# Patient Record
Sex: Female | Born: 1965 | Race: White | Hispanic: No | Marital: Married | Smoking: Never smoker
Health system: Southern US, Community
[De-identification: ages and names within clinical notes are randomized; demographics above are authoritative.]

## PROBLEM LIST (undated history)

## (undated) DIAGNOSIS — IMO0002 Reserved for concepts with insufficient information to code with codable children: Secondary | ICD-10-CM

## (undated) DIAGNOSIS — N83209 Unspecified ovarian cyst, unspecified side: Secondary | ICD-10-CM

## (undated) DIAGNOSIS — K589 Irritable bowel syndrome without diarrhea: Secondary | ICD-10-CM

## (undated) DIAGNOSIS — K219 Gastro-esophageal reflux disease without esophagitis: Secondary | ICD-10-CM

## (undated) DIAGNOSIS — K760 Fatty (change of) liver, not elsewhere classified: Secondary | ICD-10-CM

## (undated) DIAGNOSIS — G43909 Migraine, unspecified, not intractable, without status migrainosus: Secondary | ICD-10-CM

## (undated) DIAGNOSIS — N809 Endometriosis, unspecified: Secondary | ICD-10-CM

## (undated) DIAGNOSIS — D649 Anemia, unspecified: Secondary | ICD-10-CM

## (undated) DIAGNOSIS — K635 Polyp of colon: Secondary | ICD-10-CM

## (undated) DIAGNOSIS — Z87442 Personal history of urinary calculi: Secondary | ICD-10-CM

## (undated) DIAGNOSIS — N200 Calculus of kidney: Secondary | ICD-10-CM

## (undated) DIAGNOSIS — K2960 Other gastritis without bleeding: Secondary | ICD-10-CM

## (undated) DIAGNOSIS — T7840XA Allergy, unspecified, initial encounter: Secondary | ICD-10-CM

## (undated) HISTORY — PX: EXPLORATORY LAPAROTOMY: SUR591

## (undated) HISTORY — DX: Endometriosis, unspecified: N80.9

## (undated) HISTORY — PX: LITHOTRIPSY: SUR834

## (undated) HISTORY — DX: Unspecified ovarian cyst, unspecified side: N83.209

## (undated) HISTORY — PX: COLONOSCOPY: SHX174

## (undated) HISTORY — DX: Migraine, unspecified, not intractable, without status migrainosus: G43.909

## (undated) HISTORY — PX: ESOPHAGOGASTRODUODENOSCOPY: SHX1529

## (undated) HISTORY — DX: Calculus of kidney: N20.0

## (undated) HISTORY — DX: Allergy, unspecified, initial encounter: T78.40XA

---

## 1998-07-22 HISTORY — PX: APPENDECTOMY: SHX54

## 1998-07-22 HISTORY — PX: ABDOMINAL HYSTERECTOMY: SHX81

## 2000-07-22 HISTORY — PX: RIGHT OOPHORECTOMY: SHX2359

## 2009-10-16 ENCOUNTER — Emergency Department: Payer: Self-pay | Admitting: Emergency Medicine

## 2009-10-23 ENCOUNTER — Ambulatory Visit: Payer: Self-pay | Admitting: General Practice

## 2009-10-25 ENCOUNTER — Ambulatory Visit: Payer: Self-pay | Admitting: Urology

## 2009-10-26 ENCOUNTER — Ambulatory Visit: Payer: Self-pay | Admitting: Urology

## 2009-11-09 ENCOUNTER — Ambulatory Visit: Payer: Self-pay | Admitting: Urology

## 2010-01-16 ENCOUNTER — Ambulatory Visit: Payer: Self-pay | Admitting: Family Medicine

## 2010-05-04 ENCOUNTER — Ambulatory Visit: Payer: Self-pay | Admitting: General Practice

## 2010-06-12 ENCOUNTER — Ambulatory Visit: Payer: Self-pay | Admitting: General Practice

## 2011-01-22 ENCOUNTER — Ambulatory Visit: Payer: Self-pay | Admitting: Family Medicine

## 2011-10-22 ENCOUNTER — Emergency Department: Payer: Self-pay | Admitting: *Deleted

## 2012-01-25 LAB — HM PAP SMEAR

## 2012-01-28 ENCOUNTER — Ambulatory Visit: Payer: Self-pay | Admitting: Family Medicine

## 2012-08-27 ENCOUNTER — Encounter: Payer: Self-pay | Admitting: Internal Medicine

## 2012-08-27 ENCOUNTER — Ambulatory Visit (INDEPENDENT_AMBULATORY_CARE_PROVIDER_SITE_OTHER): Payer: PRIVATE HEALTH INSURANCE | Admitting: Internal Medicine

## 2012-08-27 VITALS — BP 110/70 | HR 80 | Temp 98.0°F | Ht 65.5 in | Wt 215.0 lb

## 2012-08-27 DIAGNOSIS — N200 Calculus of kidney: Secondary | ICD-10-CM | POA: Insufficient documentation

## 2012-08-27 DIAGNOSIS — Z83719 Family history of colon polyps, unspecified: Secondary | ICD-10-CM | POA: Insufficient documentation

## 2012-08-27 DIAGNOSIS — Z8371 Family history of colonic polyps: Secondary | ICD-10-CM | POA: Insufficient documentation

## 2012-08-27 DIAGNOSIS — G43909 Migraine, unspecified, not intractable, without status migrainosus: Secondary | ICD-10-CM | POA: Insufficient documentation

## 2012-08-27 DIAGNOSIS — N809 Endometriosis, unspecified: Secondary | ICD-10-CM | POA: Insufficient documentation

## 2012-08-27 NOTE — Assessment & Plan Note (Signed)
Saw Dr Achilles Dunk.  S/p lithotripsy.  Doing well. Now.  Has had no further problems.

## 2012-08-27 NOTE — Progress Notes (Signed)
  Subjective:    Patient ID: Katie [name], female    DOB: 09-May-1966, 47 y.o.   MRN: [id number]  HPI 47 year old female with past history of endometriosis s/p hysterectomy, nephrolithiasis and migraine headaches who comes in today to follow up on these issues as well as to establish care.  Previously was seeing Dr Vonita Moss.  Has issues with reoccurring sinus infections.  Currently doing well.  Headaches better since her hysterectomy.   Takes ibuprofen for her headaches.  Some hot flashes.  Breathing stable.  No nausea or vomiting.  Bowels stable.    Past Medical History  Diagnosis Date  . Kidney stones   . Migraines   . Endometriosis   . Ovarian cyst     Review of Systems Patient denies any headache currently.  Has a history of migraines.  These start with her seeing spots and if she takes ibuprofen then - resolves.  No lightheadedness or dizziness.  No chest pain, tightness or palpitations.  No increased shortness of breath, cough or congestion.  No nausea or vomiting.  No acid reflux.  No abdominal pain or cramping.  No bowel change, such as diarrhea, constipation, BRBPR or melana.  No urine change.   No known history of abnormal pap smears.  Mammograms - normal.  Last cpe 01/2012.       Objective:   Physical Exam Filed Vitals:   08/27/12 1551  BP: 110/70  Pulse: 80  Temp: 98 F (53.43 C)   47 year old female in no acute distress.   HEENT:  Nares- clear.  Oropharynx - without lesions. NECK:  Supple.  Nontender.  No audible bruit.  HEART:  Appears to be regular. LUNGS:  No crackles or wheezing audible.  Respirations even and unlabored.  RADIAL PULSE:  Equal bilaterally.  ABDOMEN:  Soft, nontender.  Bowel sounds present and normal.  No audible abdominal bruit.    EXTREMITIES:  No increased edema present.  DP pulses palpable and equal bilaterally.           Assessment & Plan:  REOCCURRING SINUS INFECTIONS.  Doing well now.    HEALTH MAINTENANCE.  Last physical 7/13.   Mammogram 7/13.  Discussed need for colonoscopy given mother's history of colon polyps.

## 2012-08-27 NOTE — Assessment & Plan Note (Signed)
Mother with colon polyps.  Needs colonoscopy.  Discussed with her today.  Will notify me when agreeable.

## 2012-08-27 NOTE — Assessment & Plan Note (Addendum)
S/P hysterectomy with oophorectomy.  Doing well.  Follow.   

## 2012-08-27 NOTE — Assessment & Plan Note (Signed)
Better since her hysterectomy.  Follow.   

## 2012-09-23 ENCOUNTER — Encounter: Payer: Self-pay | Admitting: Internal Medicine

## 2012-11-02 ENCOUNTER — Ambulatory Visit (INDEPENDENT_AMBULATORY_CARE_PROVIDER_SITE_OTHER): Payer: PRIVATE HEALTH INSURANCE | Admitting: Adult Health

## 2012-11-02 ENCOUNTER — Encounter: Payer: Self-pay | Admitting: Adult Health

## 2012-11-02 VITALS — BP 106/70 | HR 86 | Temp 98.1°F | Resp 16 | Wt 219.0 lb

## 2012-11-02 DIAGNOSIS — J329 Chronic sinusitis, unspecified: Secondary | ICD-10-CM | POA: Insufficient documentation

## 2012-11-02 MED ORDER — DOXYCYCLINE HYCLATE 100 MG PO TABS: 100.0000 mg | ORAL_TABLET | Freq: Two times a day (BID) | ORAL | Status: DC

## 2012-11-02 MED ORDER — FLUTICASONE PROPIONATE 50 MCG/ACT NA SUSP: 2.0000 | Freq: Every day | NASAL | Status: DC

## 2012-11-02 NOTE — Progress Notes (Signed)
  Subjective:    Patient ID: Katie [name], female    DOB: 1966-06-05, 47 y.o.   MRN: [id number]  HPI  Patient is a 47 year old female who presents to clinic with mild sore throat, sinus pressure, drainage and cough. Symptoms began last Thursday. She reports yellow drainage. She has tried tylenol cold and sinus, robitussin, benadryl with not much relief. Denies fever or chills or shortness of breath; however, when she has a coughing spell she does get slightly short of breath.   Review of Systems  Constitutional: Negative for fever and chills.  HENT: Positive for congestion, sore throat, postnasal drip and sinus pressure.   Respiratory: Positive for cough and shortness of breath. Negative for chest tightness and wheezing.   Cardiovascular: Negative for chest pain.  Gastrointestinal: Negative.    BP 106/70  Pulse 86  Temp(Src) 98.1 F (36.7 C) (Oral)  Resp 16  Wt 219 lb (99.338 kg)  BMI 35.88 kg/m2  SpO2 97%  LMP 08/27/1998     Objective:   Physical Exam  Constitutional: She is oriented to person, place, and time. She appears well-developed and well-nourished. No distress.  HENT:  Head: Normocephalic and atraumatic.  Right Ear: External ear normal.  Left Ear: External ear normal.  Pharyngeal erythema without exudate. Post pharyngeal drainage.  Eyes: Conjunctivae are normal. Pupils are equal, round, and reactive to light.  Cardiovascular: Normal rate, regular rhythm and normal heart sounds.   Pulmonary/Chest: Effort normal and breath sounds normal. No respiratory distress. She has no wheezes. She has no rales.  Lymphadenopathy:    She has no cervical adenopathy.  Neurological: She is alert and oriented to person, place, and time.  Skin: Skin is warm and dry.  Psychiatric: She has a normal mood and affect. Her behavior is normal. Thought content normal.       Assessment & Plan:

## 2012-11-02 NOTE — Patient Instructions (Addendum)
Start your antibiotic today. You will take this for 10 days.  Also start the fonase nasal spray - two sprays into each nostril at bedtime.    What is sinusitis? - Sinusitis is a condition that can cause a stuffy nose, pain in the face, and yellow or green discharge (mucus) from the nose. The sinuses are hollow areas in the bones of the face. They have a thin lining that normally makes a small amount of mucus. When this lining gets infected, it swells and makes extra mucus. This causes symptoms.   Sinusitis can occur when a person gets sick with a cold. The germs causing the cold can also infect the sinuses. Many times, a person feels like his or her cold is getting better. But then he or she gets sinusitis and begins to feel sick again.  What are the symptoms of sinusitis? - Common symptoms of sinusitis include: Stuffy or blocked nose  Thick yellow or green discharge from the nose  Pain in the teeth  Pain or pressure in the face - This often feels worse when a person bends forward.   People with sinusitis can also have other symptoms that include: Fever  Cough  Trouble smelling  Ear pressure or fullness  Headache  Bad breath  Feeling tired   Most of the time, symptoms start to improve in 7 to 10 days.  Should I see a doctor or nurse? - See your doctor or nurse if your symptoms last more than 7 days, or if your symptoms get better at first but then get worse. Sometimes, sinusitis can lead to serious problems. See your doctor or nurse right away (do not wait 7 days) if you have: Fever higher than 102.43F (39.2C)  Sudden and severe pain in the face and head  Trouble seeing or seeing double  Trouble thinking clearly  Swelling or redness around 1 or both eyes  Trouble breathing or a stiff neck   Is there anything I can do on my own to feel better? - Yes. To reduce your symptoms, you can: Take an over-the-counter pain reliever to reduce the pain  Rinse your nose and sinuses with  salt water a few times a day - Ask your doctor or nurse about the best way to do this.  Use a decongestant nose spray - These sprays are sold in a pharmacy. But do not use decongestant nose sprays for more than 2 to 3 days in a row. Using them more than 3 days in a row can make symptoms worse.   You should NOT take an antihistamine for sinusitis. Common antihistamines include diphenhydramine (sample brand name: Benadryl), chlorpheniramine (sample brand name: Chlor-Trimeton), loratadine (sample brand name: Claritin), and cetirizine (sample brand name: Zyrtec). They can treat allergies, but not sinus infections, and could increase your discomfort by drying the lining of your nose and sinuses, or making you tired.   Your doctor might also prescribe a steroid nose spray to reduce the swelling in your nose. (Steroid nose sprays do not contain the same steroids that athletes take to build muscle.)  How is sinusitis treated? - Most of the time, sinusitis does not need to be treated with antibiotic medicines. This is because most sinusitis is caused by viruses - not bacteria - and antibiotics do not kill viruses. Many people get over sinus infections without antibiotics.  Some people with sinusitis do need treatment with antibiotics. If your symptoms have not improved after 7 to 10 days, ask your  doctor if you should take antibiotics. Your doctor might recommend that you wait 1 more week to see if your symptoms improve. But if you have symptoms such as a fever or a lot of pain, he or she might prescribe antibiotics. It is important to follow your doctor's instructions about taking your antibiotics.  What if my symptoms do not get better? - If your symptoms do not get better, talk with your doctor or nurse. He or she might order tests to figure out why you still have symptoms. These can include:  CT scan or other imaging tests - Imaging tests create pictures of the inside of the body.  A test to look inside  the sinuses - For this test, a doctor puts a thin tube with a camera on the end into the nose and up into the sinuses.   Some people get a lot of sinus infections or have symptoms that last at least 3 months. These people can have a different type of sinusitis called "chronic sinusitis." Chronic sinusitis can be caused by different things. For example, some people have growths in their nose or sinuses that are called "polyps." Other people have allergies that cause their symptoms.  Chronic sinusitis can be treated in different ways. If you have chronic sinusitis, talk with your doctor about which treatments are right for you.

## 2012-11-02 NOTE — Assessment & Plan Note (Addendum)
Patient is allergic to amoxicillin. I was going to start azithromycin, however, patient reports that this has not work for her. Will start her on doxycycline twice a day x10 days. Also start Flonase 2 sprays into each nostril at bedtime.

## 2012-11-04 ENCOUNTER — Other Ambulatory Visit: Payer: Self-pay | Admitting: Adult Health

## 2012-11-04 ENCOUNTER — Telehealth: Payer: Self-pay | Admitting: Internal Medicine

## 2012-11-04 MED ORDER — AZITHROMYCIN 250 MG PO TABS: ORAL_TABLET | ORAL | Status: DC

## 2012-11-04 NOTE — Progress Notes (Signed)
Patient called stating that she was having nausea and diarrhea with the doxycycline. Start Azithromycin. Sent in to pharmacy.

## 2012-11-04 NOTE — Telephone Encounter (Signed)
Caller: Marleni/Patient; Phone: [phone]; Reason for Call: Pt states she was seen in the office and Rx given for Doxycycline.  Pt states has vomited and had diarrhea with the 2 doses she took, stopped medication and vomiting and diarrhea resolved.  Pt requesting a change of antibiotics.  PLEASE F/U WITH PT, THANK YOU.

## 2013-02-03 ENCOUNTER — Encounter: Payer: Self-pay | Admitting: Adult Health

## 2013-02-03 ENCOUNTER — Ambulatory Visit (INDEPENDENT_AMBULATORY_CARE_PROVIDER_SITE_OTHER): Payer: Managed Care, Other (non HMO) | Admitting: Adult Health

## 2013-02-03 VITALS — BP 120/68 | HR 86 | Temp 98.1°F | Resp 12 | Wt 218.0 lb

## 2013-02-03 DIAGNOSIS — R600 Localized edema: Secondary | ICD-10-CM | POA: Insufficient documentation

## 2013-02-03 DIAGNOSIS — I872 Venous insufficiency (chronic) (peripheral): Secondary | ICD-10-CM

## 2013-02-03 DIAGNOSIS — R609 Edema, unspecified: Secondary | ICD-10-CM

## 2013-02-03 NOTE — Assessment & Plan Note (Signed)
Suspect venous insufficiency. Will send patient for venous Doppler. Use compression stockings. When sitting at her desk she should get up at least every 2 hours and move around. Elevate lower extremities when possible.

## 2013-02-03 NOTE — Progress Notes (Signed)
  Subjective:    Patient ID: Katie [name], female    DOB: [birth year], 47 y.o.   MRN: [id number]  HPI  Patient is a pleasant 47 year old female who presents to clinic with complaints of bilateral ankle swelling and redness. She reports recent trip to [place] with considerable amounts of walking. She reports her symptoms are worse in the evening and improved when she first wakes up in the morning. 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 She denies fever, shortness of breath, injury. She denies calf pain.   Review of Systems  Respiratory: Negative.   Cardiovascular: Positive for leg swelling.  Skin:       Redness bilateral ankles    BP 120/68  Pulse 86  Temp(Src) 98.1 F (36.7 C) (Oral)  Resp 12  Wt 218 lb (98.884 kg)  BMI 35.71 kg/m2  SpO2 96%  LMP 08/27/1998    Objective:   Physical Exam  Constitutional: She is oriented to person, place, and time.  Overweight, pleasant female in no apparent distress  Cardiovascular: Normal rate and regular rhythm.   Pulmonary/Chest: Effort normal. No respiratory distress.  Musculoskeletal: Normal range of motion. She exhibits edema. She exhibits no tenderness.  Neurological: She is alert and oriented to person, place, and time.  Skin: Skin is warm and dry. There is erythema.  Psychiatric: She has a normal mood and affect. Her behavior is normal. Judgment and thought content normal.      Assessment & Plan:

## 2013-02-03 NOTE — Patient Instructions (Addendum)
  I am sending you for a venous doppler (ultrasound) of the legs.  Elevate your legs whenever possible.  Use compression stockings to help the blood flow.  Get up from your desk and move around at least every 2 hours.

## 2013-02-04 ENCOUNTER — Encounter: Payer: Self-pay | Admitting: Emergency Medicine

## 2013-02-04 ENCOUNTER — Telehealth: Payer: Self-pay | Admitting: *Deleted

## 2013-02-04 NOTE — Telephone Encounter (Signed)
Wrong doctor's office...needed Dr. Darrick Huntsman

## 2013-03-02 ENCOUNTER — Encounter: Payer: Self-pay | Admitting: Internal Medicine

## 2013-03-02 ENCOUNTER — Ambulatory Visit (INDEPENDENT_AMBULATORY_CARE_PROVIDER_SITE_OTHER): Payer: Managed Care, Other (non HMO) | Admitting: Internal Medicine

## 2013-03-02 VITALS — BP 110/80 | HR 78 | Temp 98.5°F | Ht 65.75 in | Wt 219.0 lb

## 2013-03-02 DIAGNOSIS — G43909 Migraine, unspecified, not intractable, without status migrainosus: Secondary | ICD-10-CM

## 2013-03-02 DIAGNOSIS — N951 Menopausal and female climacteric states: Secondary | ICD-10-CM

## 2013-03-02 DIAGNOSIS — R609 Edema, unspecified: Secondary | ICD-10-CM

## 2013-03-02 DIAGNOSIS — N200 Calculus of kidney: Secondary | ICD-10-CM

## 2013-03-02 DIAGNOSIS — Z83719 Family history of colon polyps, unspecified: Secondary | ICD-10-CM

## 2013-03-02 DIAGNOSIS — Z8371 Family history of colonic polyps: Secondary | ICD-10-CM

## 2013-03-02 DIAGNOSIS — N809 Endometriosis, unspecified: Secondary | ICD-10-CM

## 2013-03-02 DIAGNOSIS — R600 Localized edema: Secondary | ICD-10-CM

## 2013-03-03 ENCOUNTER — Other Ambulatory Visit: Payer: Self-pay | Admitting: Internal Medicine

## 2013-03-04 ENCOUNTER — Encounter: Payer: Self-pay | Admitting: Internal Medicine

## 2013-03-04 DIAGNOSIS — N951 Menopausal and female climacteric states: Secondary | ICD-10-CM | POA: Insufficient documentation

## 2013-03-04 NOTE — Progress Notes (Signed)
  Subjective:    Patient ID: Katie [name], female    DOB: Jul 01, 1966, 47 y.o.   MRN: [id number]  HPI 47 year old female with past history of endometriosis s/p hysterectomy, nephrolithiasis and migraine headaches who comes in today to follow up on these issues as well as for a complete physical exam. Has issues with reoccurring sinus infections.  Currently doing well.  Headaches better since her hysterectomy.    Some increased hot flashes.  Discussed treatment.  Breathing stable.  No nausea or vomiting.  Bowels stable.  Recently saw Dr Lorretta Harp.  Had evaluation.  Started on magnesium.  Has follow up in 3 months.  Overall she feels she is doing well.    Past Medical History  Diagnosis Date  . Kidney stones   . Migraines   . Endometriosis   . Ovarian cyst     Outpatient Encounter Prescriptions as of 03/02/2013  Medication Sig Dispense Refill  . Magnesium 400 MG CAPS Take by mouth daily.       No facility-administered encounter medications on file as of 03/02/2013.    Review of Systems Patient denies any headache currently.  Has a history of migraines.  No lightheadedness or dizziness. No chest pain, tightness or palpitations.  No increased shortness of breath, cough or congestion.  No nausea or vomiting.  No acid reflux.  No abdominal pain or cramping.  No bowel change, such as diarrhea, constipation, BRBPR or melana.  No urine change.   No known history of abnormal pap smears.  Mammograms - normal.      Objective:   Physical Exam  Filed Vitals:   03/02/13 1538  BP: 110/80  Pulse: 78  Temp: 98.5 F (36.9 C)   Blood pressure recheck:  71/54  47 year old female in no acute distress.   HEENT:  Nares- clear.  Oropharynx - without lesions. NECK:  Supple.  Nontender.  No audible bruit.  HEART:  Appears to be regular. LUNGS:  No crackles or wheezing audible.  Respirations even and unlabored.  RADIAL PULSE:  Equal bilaterally.    BREASTS:  No nipple discharge or nipple retraction  present.  Could not appreciate any distinct nodules or axillary adenopathy.  ABDOMEN:  Soft, nontender.  Bowel sounds present and normal.  No audible abdominal bruit.  GU:  Not performed.    EXTREMITIES:  No increased edema present.  DP pulses palpable and equal bilaterally.          Assessment & Plan:  REOCCURRING SINUS INFECTIONS.  Doing well now.    HEALTH MAINTENANCE.  Physical today.   Mammogram 01/28/12 - Birads I.  Information given for follow up mammogram.    Discussed need for colonoscopy given mother's history of colon polyps.

## 2013-03-04 NOTE — Assessment & Plan Note (Signed)
Saw vascular surgery (Dr Lorretta Harp).  Support hose.

## 2013-03-04 NOTE — Assessment & Plan Note (Signed)
Saw Dr Achilles Dunk.  S/p lithotripsy.  Doing well. Now.  Has had no further problems.

## 2013-03-04 NOTE — Assessment & Plan Note (Signed)
Having increased hot flashes.  Discussed with her today regarding treatment options.  She will think about this and notify me if desires any further intervention.

## 2013-03-04 NOTE — Assessment & Plan Note (Signed)
S/P hysterectomy with oophorectomy.  Doing well.  Follow.   

## 2013-03-04 NOTE — Assessment & Plan Note (Signed)
Better since her hysterectomy.  Follow.   

## 2013-03-04 NOTE — Assessment & Plan Note (Signed)
Mother with colon polyps.  Needs colonoscopy.  Will notify me when agreeable.   

## 2013-03-05 LAB — COMPREHENSIVE METABOLIC PANEL
Albumin: 4.1 g/dL (ref 3.5–5.5)
BUN: 12 mg/dL (ref 6–24)
CO2: 26 mmol/L (ref 18–29)
Calcium: 9.4 mg/dL (ref 8.7–10.2)
Chloride: 104 mmol/L (ref 97–108)
Creatinine, Ser: 0.66 mg/dL (ref 0.57–1.00)
Globulin, Total: 2.5 g/dL (ref 1.5–4.5)
Glucose: 88 mg/dL (ref 65–99)
Total Protein: 6.6 g/dL (ref 6.0–8.5)

## 2013-03-05 LAB — CBC WITH DIFFERENTIAL
Eos: 3 % (ref 0–5)
Immature Grans (Abs): 0 10*3/uL (ref 0.0–0.1)
Immature Granulocytes: 0 % (ref 0–2)
Lymphocytes Absolute: 1.5 10*3/uL (ref 0.7–3.1)
Lymphs: 30 % (ref 14–46)
MCV: 84 fL (ref 79–97)
Monocytes: 7 % (ref 4–12)
Neutrophils Relative %: 60 % (ref 40–74)
Platelets: 185 10*3/uL (ref 150–379)
RBC: 4.37 x10E6/uL (ref 3.77–5.28)
WBC: 5 10*3/uL (ref 3.4–10.8)

## 2013-03-05 LAB — TSH: TSH: 1.99 u[IU]/mL (ref 0.450–4.500)

## 2013-03-05 LAB — LIPID PANEL W/O CHOL/HDL RATIO
Cholesterol, Total: 174 mg/dL (ref 100–199)
HDL: 48 mg/dL (ref >39)
LDL Calculated: 110 mg/dL — ABNORMAL HIGH (ref 0–99)
Triglycerides: 82 mg/dL (ref 0–149)

## 2013-03-09 ENCOUNTER — Ambulatory Visit: Payer: Self-pay | Admitting: Internal Medicine

## 2013-03-09 ENCOUNTER — Other Ambulatory Visit: Payer: Self-pay | Admitting: Internal Medicine

## 2013-03-09 ENCOUNTER — Encounter: Payer: Self-pay | Admitting: *Deleted

## 2013-03-09 DIAGNOSIS — R945 Abnormal results of liver function studies: Secondary | ICD-10-CM

## 2013-03-09 LAB — HM MAMMOGRAPHY

## 2013-03-09 NOTE — Progress Notes (Signed)
Order placed for f/u liver panel.  

## 2013-03-18 ENCOUNTER — Telehealth: Payer: Self-pay | Admitting: Internal Medicine

## 2013-03-18 NOTE — Telephone Encounter (Signed)
[facility] form just needs signature. (in your folder)

## 2013-03-18 NOTE — Telephone Encounter (Signed)
Asking to pick up Script to take to [facility] for blood work.  Wants to pick up 8/29

## 2013-03-18 NOTE — Telephone Encounter (Signed)
Order signed and placed in your box.   

## 2013-03-19 NOTE — Telephone Encounter (Signed)
Rx placed up front for pt to pick up & pt notified

## 2013-03-24 ENCOUNTER — Telehealth: Payer: Self-pay | Admitting: *Deleted

## 2013-03-24 DIAGNOSIS — R945 Abnormal results of liver function studies: Secondary | ICD-10-CM

## 2013-03-24 NOTE — Telephone Encounter (Signed)
Pt wanted to know if we have her [facility] lab results back? Had labs drawn on Friday.

## 2013-03-25 NOTE — Telephone Encounter (Signed)
I received and reviewed her labs.  Her liver function is still just slightly increased.  Can sometime see with fatty liver.  I would like to schedule her for an abdominal ultrasound - just to evaluate further.  Let me know if agreeable and I will place order for referral.

## 2013-03-26 NOTE — Telephone Encounter (Signed)
Reviewed pt message.  Abdominal US ordered.

## 2013-03-26 NOTE — Telephone Encounter (Signed)
Pt notified of lab results & willing to proceed with ultrasound (pt aware that she will be hearing from [facility] sometime next week with appt date & time)

## 2013-03-31 ENCOUNTER — Ambulatory Visit: Payer: Self-pay | Admitting: Internal Medicine

## 2013-04-08 ENCOUNTER — Encounter: Payer: Self-pay | Admitting: Internal Medicine

## 2013-04-16 ENCOUNTER — Encounter: Payer: Self-pay | Admitting: Internal Medicine

## 2013-04-23 ENCOUNTER — Telehealth: Payer: Self-pay | Admitting: Internal Medicine

## 2013-04-23 NOTE — Telephone Encounter (Signed)
I have mailed it once, but I kept a copy here also. I have spoke with pt & stated that I would fax a copy to [facility] on [address]. [facility] faxed today at 1:45pm

## 2013-04-23 NOTE — Telephone Encounter (Signed)
Pt states she had spoken with Dr. Lorin Picket about a lab requisition to take to [facility] for liver panels.  Pt has not received this, was supposed to come in the mail.  Pt would like this mailed, unless she needs to have it done soon and the pt could come in to pick up lab requisition.  Please contact pt.

## 2013-04-30 ENCOUNTER — Telehealth: Payer: Self-pay | Admitting: *Deleted

## 2013-04-30 DIAGNOSIS — R945 Abnormal results of liver function studies: Secondary | ICD-10-CM

## 2013-04-30 NOTE — Telephone Encounter (Signed)
Order placed for referral to GI 

## 2013-04-30 NOTE — Telephone Encounter (Signed)
Notified pt that her live fn (ALT & ALK Phos.) sill slightly increased. Given this & that the abdominal U/S revelaed fatty liver & mildly enlarged spleen-would like to refer her to GI for further eval. Pt is agreeable to proceed with referral. Copy of labs placed in [facility] box & original sent for scanning

## 2013-05-03 ENCOUNTER — Telehealth: Payer: Self-pay | Admitting: Internal Medicine

## 2013-05-03 NOTE — Telephone Encounter (Signed)
Informed patient of apt scheduled with [facility] on 06/10/13. Patient is wondering if there is anything she needs to do in the meantime.

## 2013-05-04 NOTE — Telephone Encounter (Signed)
Diet, exercise and weight loss - help with fatty liver.

## 2013-05-05 NOTE — Telephone Encounter (Signed)
Pt.notified

## 2013-05-18 ENCOUNTER — Emergency Department: Payer: Self-pay | Admitting: Internal Medicine

## 2013-05-18 LAB — CBC WITH DIFFERENTIAL/PLATELET
Basophil #: 0 10*3/uL (ref 0.0–0.1)
Basophil %: 0.6 %
Eosinophil #: 0.1 10*3/uL (ref 0.0–0.7)
Eosinophil %: 1.1 %
HCT: 36.2 % (ref 35.0–47.0)
Lymphocyte #: 1.4 10*3/uL (ref 1.0–3.6)
Lymphocyte %: 22.6 %
MCV: 83 fL (ref 80–100)
Monocyte #: 0.3 x10 3/mm (ref 0.2–0.9)
Monocyte %: 4.7 %
Neutrophil %: 71 %
Platelet: 204 10*3/uL (ref 150–440)
RBC: 4.38 10*6/uL (ref 3.80–5.20)
WBC: 6.2 10*3/uL (ref 3.6–11.0)

## 2013-05-18 LAB — URINALYSIS, COMPLETE
Bacteria: NONE SEEN
Ketone: NEGATIVE
Nitrite: NEGATIVE
Protein: NEGATIVE
Squamous Epithelial: <1

## 2013-05-18 LAB — COMPREHENSIVE METABOLIC PANEL
Albumin: 3.9 g/dL (ref 3.4–5.0)
Alkaline Phosphatase: 132 U/L (ref 50–136)
Co2: 29 mmol/L (ref 21–32)
EGFR (African American): >60
EGFR (Non-African Amer.): >60
Glucose: 91 mg/dL (ref 65–99)
Osmolality: 275 (ref 275–301)
Potassium: 4.3 mmol/L (ref 3.5–5.1)
Sodium: 137 mmol/L (ref 136–145)

## 2013-05-25 ENCOUNTER — Encounter: Payer: Self-pay | Admitting: Internal Medicine

## 2013-05-28 ENCOUNTER — Ambulatory Visit: Payer: Self-pay | Admitting: Gastroenterology

## 2013-06-11 ENCOUNTER — Ambulatory Visit: Payer: Self-pay | Admitting: Gastroenterology

## 2013-08-06 ENCOUNTER — Ambulatory Visit (INDEPENDENT_AMBULATORY_CARE_PROVIDER_SITE_OTHER): Payer: Managed Care, Other (non HMO) | Admitting: Adult Health

## 2013-08-06 ENCOUNTER — Encounter: Payer: Self-pay | Admitting: Adult Health

## 2013-08-06 VITALS — BP 120/72 | HR 88 | Temp 98.9°F | Resp 12 | Wt 217.0 lb

## 2013-08-06 DIAGNOSIS — J329 Chronic sinusitis, unspecified: Secondary | ICD-10-CM

## 2013-08-06 DIAGNOSIS — J029 Acute pharyngitis, unspecified: Secondary | ICD-10-CM

## 2013-08-06 LAB — POCT RAPID STREP A (OFFICE): RAPID STREP A SCREEN: NEGATIVE

## 2013-08-06 MED ORDER — CEFUROXIME AXETIL 250 MG PO TABS: 250.0000 mg | ORAL_TABLET | Freq: Two times a day (BID) | ORAL | Status: DC

## 2013-08-06 NOTE — Progress Notes (Signed)
   Subjective:    Patient ID: Katie MealyBernice Luger, female    DOB: Jul 08, 1966, 48 y.o.   MRN: [id number]  HPI  Pt is a 48 y/o female who presents with fever, chills, sinus pressure and congestion, ear congestion and sore throat. Sick contacts reported. Has been taking over the counter mucinex for symptoms.  Current Outpatient Prescriptions on File Prior to Visit  Medication Sig Dispense Refill  . Magnesium 400 MG CAPS Take by mouth daily.       No current facility-administered medications on file prior to visit.     Review of Systems  Constitutional: Positive for fever and chills.  HENT: Positive for congestion, postnasal drip, rhinorrhea, sinus pressure, sneezing, sore throat and voice change.   Respiratory: Positive for cough. Negative for shortness of breath and wheezing.   Cardiovascular: Negative.   Musculoskeletal:       Swollen and painful right hand s/p injury  All other systems reviewed and are negative.       Objective:   Physical Exam  Constitutional: She is oriented to person, place, and time. She appears well-developed and well-nourished. No distress.  HENT:  Head: Normocephalic and atraumatic.  Right Ear: External ear normal.  Left Ear: External ear normal.  Mouth/Throat: No oropharyngeal exudate.  Pharyngeal erythema without exudate. Drainage noted posterior pharynx.  Cardiovascular: Normal rate, regular rhythm and normal heart sounds.  Exam reveals no gallop.   No murmur heard. Pulmonary/Chest: Effort normal and breath sounds normal. No respiratory distress. She has no wheezes. She has no rales.  Lymphadenopathy:    She has cervical adenopathy.  Neurological: She is alert and oriented to person, place, and time.  Psychiatric: She has a normal mood and affect. Her behavior is normal. Judgment and thought content normal.          Assessment & Plan:

## 2013-08-06 NOTE — Assessment & Plan Note (Signed)
Negative strep. Start Ceftin bid x 10 days. Refer to patient instruction for further POC.

## 2013-08-06 NOTE — Patient Instructions (Signed)
  Start Ceftin 250 mg twice a day for 10 days.  Take tylenol or ibuprofen for fever or pain. This will also help your throat.  You can also use chloraseptic spray or lozenges for your throat.  No strep or ear infection.  Avoid using q-tips. This can irritate and cause injury to your ear drum.  You can try over the counter Dime-Tapp Elixir Cold and Allergy to help with the congestion.  Call if no improvement within 4-5 days or sooner if necessary.

## 2013-08-06 NOTE — Progress Notes (Signed)
Pre visit review using our clinic review tool, if applicable. No additional management support is needed unless otherwise documented below in the visit note. 

## 2013-09-01 ENCOUNTER — Ambulatory Visit: Payer: Managed Care, Other (non HMO) | Admitting: Internal Medicine

## 2014-03-04 ENCOUNTER — Ambulatory Visit (INDEPENDENT_AMBULATORY_CARE_PROVIDER_SITE_OTHER): Payer: Managed Care, Other (non HMO) | Admitting: Internal Medicine

## 2014-03-04 ENCOUNTER — Encounter: Payer: Self-pay | Admitting: Internal Medicine

## 2014-03-04 VITALS — BP 118/78 | HR 82 | Temp 97.9°F | Ht 66.0 in | Wt 217.2 lb

## 2014-03-04 DIAGNOSIS — E0789 Other specified disorders of thyroid: Secondary | ICD-10-CM

## 2014-03-04 DIAGNOSIS — R945 Abnormal results of liver function studies: Secondary | ICD-10-CM

## 2014-03-04 DIAGNOSIS — R7989 Other specified abnormal findings of blood chemistry: Secondary | ICD-10-CM

## 2014-03-04 DIAGNOSIS — G43009 Migraine without aura, not intractable, without status migrainosus: Secondary | ICD-10-CM

## 2014-03-04 DIAGNOSIS — N809 Endometriosis, unspecified: Secondary | ICD-10-CM

## 2014-03-04 DIAGNOSIS — Z8371 Family history of colonic polyps: Secondary | ICD-10-CM

## 2014-03-04 DIAGNOSIS — Z1239 Encounter for other screening for malignant neoplasm of breast: Secondary | ICD-10-CM

## 2014-03-04 NOTE — Progress Notes (Signed)
Pre visit review using our clinic review tool, if applicable. No additional management support is needed unless otherwise documented below in the visit note. 

## 2014-03-04 NOTE — Progress Notes (Signed)
  Subjective:    Patient ID: Katie [name], female    DOB: [birth year], 48 y.o.   MRN: [id number]  HPI 48 year old female with past history of endometriosis s/p hysterectomy, nephrolithiasis and migraine headaches who comes in today to follow up on these issues as well as for a complete physical  . Has issues with reoccurring sinus infections.  Currently doing well.  Headaches better since her hysterectomy.   Breathing stable.  No nausea or vomiting.  Bowels stable.  Still vary.  Sees [facility].  Had hepatitis vaccine.  Has follow up 03/14/14.  Overall she feels she is doing well.  Has joined a gym.       Past Medical History  Diagnosis Date  . Kidney stones   . Migraines   . Endometriosis   . Ovarian cyst     Outpatient Encounter Prescriptions as of 03/04/2014  Medication Sig  . [DISCONTINUED] cefUROXime (CEFTIN) 250 MG tablet Take 1 tablet (250 mg total) by mouth 2 (two) times daily with a meal.  . [DISCONTINUED] Magnesium 400 MG CAPS Take by mouth daily.    Review of Systems Patient denies any headache currently.  Has a history of migraines.  No lightheadedness or dizziness. No chest pain, tightness or palpitations.  No increased shortness of breath, cough or congestion.  No nausea or vomiting.  No acid reflux.  No abdominal pain or cramping.  No bowel change, such as BRBPR or melana.  Bowels stable.  No urine change.   No known history of abnormal pap smears.  Mammograms - normal.      Objective:   Physical Exam  Filed Vitals:   03/04/14 0825  BP: 110/80  Pulse: 82  Temp: 97.9 F (36.6 C)   Blood pressure recheck:  47118/5878  48 year old female in no acute distress.   HEENT:  Nares- clear.  Oropharynx - without lesions. NECK:  Supple.  Nontender.  No audible bruit.  Fullness - thyroid.   HEART:  Appears to be regular. LUNGS:  No crackles or wheezing audible.  Respirations even and unlabored.  RADIAL PULSE:  Equal bilaterally.    BREASTS:  No nipple discharge or nipple  retraction present.  Could not appreciate any distinct nodules or axillary adenopathy.  ABDOMEN:  Soft, nontender.  Bowel sounds present and normal.  No audible abdominal bruit.  GU:  Not performed.    EXTREMITIES:  No increased edema present.  DP pulses palpable and equal bilaterally.          Assessment & Plan:  REOCCURRING SINUS INFECTIONS.  Doing well now.    HEALTH MAINTENANCE.  Physical today.   Mammogram 03/09/13 - Birads I.  Schedule f/u mammogram.   Discussed need for colonoscopy given mother's history of colon polyps.

## 2014-03-05 LAB — BASIC METABOLIC PANEL
BUN: 15 mg/dL (ref 4–21)
Creatinine: 0.8 mg/dL (ref 0.5–1.1)
GLUCOSE: 87 mg/dL
Potassium: 4.8 mmol/L (ref 3.4–5.3)
Sodium: 141 mmol/L (ref 137–147)

## 2014-03-05 LAB — CBC AND DIFFERENTIAL
HCT: 37 % (ref 36–46)
Hemoglobin: 12.6 g/dL (ref 12.0–16.0)
PLATELETS: 199 10*3/uL (ref 150–399)
WBC: 6.2 10^3/mL

## 2014-03-05 LAB — HEPATIC FUNCTION PANEL
ALT: 55 U/L — AB (ref 7–35)
AST: 45 U/L — AB (ref 13–35)
Alkaline Phosphatase: 121 U/L (ref 25–125)
Bilirubin, Total: 1 mg/dL

## 2014-03-05 LAB — LIPID PANEL
Cholesterol: 171 mg/dL (ref 0–200)
HDL: 43 mg/dL (ref 35–70)
LDL CALC: 111 mg/dL
LDL/HDL RATIO: 2.6
Triglycerides: 83 mg/dL (ref 40–160)

## 2014-03-05 LAB — TSH: TSH: 1.06 u[IU]/mL (ref 0.41–5.90)

## 2014-03-06 ENCOUNTER — Encounter: Payer: Self-pay | Admitting: Internal Medicine

## 2014-03-06 DIAGNOSIS — R945 Abnormal results of liver function studies: Secondary | ICD-10-CM | POA: Insufficient documentation

## 2014-03-06 DIAGNOSIS — R7989 Other specified abnormal findings of blood chemistry: Secondary | ICD-10-CM | POA: Insufficient documentation

## 2014-03-06 DIAGNOSIS — E0789 Other specified disorders of thyroid: Secondary | ICD-10-CM | POA: Insufficient documentation

## 2014-03-06 NOTE — Assessment & Plan Note (Signed)
Palpable fullness - thyroid.  Check TFTs.  Check thyroid ultrasound.

## 2014-03-06 NOTE — Assessment & Plan Note (Signed)
Mother with colon polyps.  Needs colonoscopy.  Will notify me when agreeable.

## 2014-03-06 NOTE — Assessment & Plan Note (Signed)
Better since her hysterectomy.  Follow.

## 2014-03-06 NOTE — Assessment & Plan Note (Signed)
S/P hysterectomy with oophorectomy.  Doing well.  Follow.

## 2014-03-06 NOTE — Assessment & Plan Note (Signed)
Worked up extensively by GI.  Fatty liver.  Follow liver function tests.  Has had hepatitis vaccines.  Has f/u with GI 03/14/14.

## 2014-03-08 ENCOUNTER — Other Ambulatory Visit: Payer: Self-pay | Admitting: Internal Medicine

## 2014-03-08 ENCOUNTER — Telehealth: Payer: Self-pay | Admitting: Internal Medicine

## 2014-03-08 NOTE — Telephone Encounter (Signed)
Notify pt that I reviewed her recent [facility] labs and her liver tests remain elevated slightly.  Has had abdominal ultrasound. Does have fatty liver.  Has seen GI.  Cholesterol ok.  Continue low cholesterol diet and exercise.  Other labs ok.  Will need to follow liver panel.  I would like for her to have these rechecked within the next 6 weeks.  [facility] form completed and in your box.  Tell her I am going to forward these to GI as well.  Will let her know if any other recommendations.

## 2014-03-08 NOTE — Telephone Encounter (Signed)
Pt notified of lab results and verbalized understanding. Stated she will pick up lab order form, placed up front for pickup

## 2014-03-09 ENCOUNTER — Telehealth: Payer: Self-pay | Admitting: Internal Medicine

## 2014-03-09 DIAGNOSIS — R7989 Other specified abnormal findings of blood chemistry: Secondary | ICD-10-CM

## 2014-03-09 DIAGNOSIS — R945 Abnormal results of liver function studies: Secondary | ICD-10-CM

## 2014-03-09 NOTE — Telephone Encounter (Signed)
Spoke with Owens Sharkawn Harrison (GI).  Pts mother has a history of colon polyps.  Given pts age, discussed possible screening colonoscopy.  Also discussed us results with mild splenomegaly and fatty liver.  LFTs slightly elevated.  Dawn will discuss with her regarding pursuing CT scan vis MRI to better evaluate the liver.

## 2014-03-17 ENCOUNTER — Ambulatory Visit (INDEPENDENT_AMBULATORY_CARE_PROVIDER_SITE_OTHER): Payer: Managed Care, Other (non HMO) | Admitting: Adult Health

## 2014-03-17 ENCOUNTER — Encounter: Payer: Self-pay | Admitting: Adult Health

## 2014-03-17 VITALS — BP 135/78 | HR 93 | Temp 98.8°F | Resp 14 | Ht 66.0 in | Wt 217.2 lb

## 2014-03-17 DIAGNOSIS — J018 Other acute sinusitis: Secondary | ICD-10-CM

## 2014-03-17 MED ORDER — CEFUROXIME AXETIL 250 MG PO TABS: 250.0000 mg | ORAL_TABLET | Freq: Two times a day (BID) | ORAL | Status: DC

## 2014-03-17 NOTE — Progress Notes (Signed)
Patient ID: Katie [name], female   DOB: June 01, 1966, 48 y.o.   MRN: [id number]   Subjective:    Patient ID: Katie [name], female    DOB: 1965/12/19, 48 y.o.   MRN: [id number]  HPI  Patient is a 48 year old female who presents to clinic with headache, runny nose, sinus pressure, facial pain as well as teeth pain. Cough is mainly from postnasal drip. She has been taking Alka-Seltzer cold and night remedy. She reports chills no fever. Her daughter had same symptoms approximately one week ago.  Past Medical History  Diagnosis Date  . Kidney stones   . Migraines   . Endometriosis   . Ovarian cyst     No current outpatient prescriptions on file prior to visit.   No current facility-administered medications on file prior to visit.     Review of Systems  Constitutional: Positive for chills. Negative for fever.  HENT: Positive for congestion, postnasal drip, rhinorrhea, sinus pressure and sneezing. Negative for sore throat.   Respiratory: Positive for cough. Negative for shortness of breath and wheezing.        Objective:  BP 135/78  Pulse 93  Temp(Src) 98.8 F (37.1 C) (Oral)  Resp 14  Ht  (1.676 m)  Wt 217 lb 4 oz (98.544 kg)  BMI 35.08 kg/m2  SpO2 98%  LMP 08/27/1998   Physical Exam  Constitutional: She is oriented to person, place, and time. She appears well-developed and well-nourished. No distress.  HENT:  Head: Normocephalic and atraumatic.  Right Ear: External ear normal.  Left Ear: External ear normal.  Mouth/Throat: No oropharyngeal exudate.  Cardiovascular: Normal rate, regular rhythm and normal heart sounds.  Exam reveals no gallop.   No murmur heard. Pulmonary/Chest: Effort normal and breath sounds normal. No respiratory distress. She has no wheezes. She has no rales.  Lymphadenopathy:    She has cervical adenopathy.  Neurological: She is alert and oriented to person, place, and time.  Psychiatric: She has a normal mood and affect. Her behavior is  normal. Judgment and thought content normal.      Assessment & Plan:   1. Other acute sinusitis Multiple drug allergies. Reports that Azithromycin "does nothing for me". Will start Ceftin 250 mg bid x 10 days. RTC if no improvement within 4-5 days or sooner if necessary.

## 2014-03-17 NOTE — Patient Instructions (Signed)
  Start Ceftin twice a day for 10 days  Drink plenty of fluids to maintain hydration.  You may use Afrin nasal spray for severe congestion only for 3 days.  Flonase nasal spray 2 sprays into each nostril daily  Please call if symptoms are not improved within 4-5 days or sooner if necessary

## 2014-04-21 ENCOUNTER — Ambulatory Visit: Payer: Self-pay | Admitting: Internal Medicine

## 2014-04-21 LAB — HM MAMMOGRAPHY: HM Mammogram: NEGATIVE

## 2014-04-22 ENCOUNTER — Telehealth: Payer: Self-pay | Admitting: *Deleted

## 2014-04-22 ENCOUNTER — Encounter: Payer: Self-pay | Admitting: *Deleted

## 2014-04-22 LAB — HEPATIC FUNCTION PANEL
ALK PHOS: 116 U/L (ref 25–125)
ALT: 45 U/L — AB (ref 7–35)
AST: 42 U/L — AB (ref 13–35)
BILIRUBIN DIRECT: 0.2 mg/dL (ref 0.01–0.4)
Bilirubin, Total: 0.6 mg/dL

## 2014-04-22 NOTE — Telephone Encounter (Signed)
Pt.notified

## 2014-04-22 NOTE — Telephone Encounter (Signed)
Ultrasound read as normal thyroid ultrasound.  I have not received lab results.

## 2014-04-22 NOTE — Telephone Encounter (Signed)
Pt calling to check the status of her Ultrasound results & labs (both done yesterday-labs drawn through [facility]).

## 2014-04-25 ENCOUNTER — Encounter: Payer: Self-pay | Admitting: Internal Medicine

## 2014-05-12 ENCOUNTER — Ambulatory Visit (INDEPENDENT_AMBULATORY_CARE_PROVIDER_SITE_OTHER): Payer: Managed Care, Other (non HMO) | Admitting: Internal Medicine

## 2014-05-12 ENCOUNTER — Encounter (INDEPENDENT_AMBULATORY_CARE_PROVIDER_SITE_OTHER): Payer: Self-pay

## 2014-05-12 ENCOUNTER — Encounter: Payer: Self-pay | Admitting: Internal Medicine

## 2014-05-12 ENCOUNTER — Telehealth: Payer: Self-pay

## 2014-05-12 VITALS — BP 118/80 | HR 94 | Temp 98.6°F | Ht 66.0 in | Wt 219.0 lb

## 2014-05-12 DIAGNOSIS — J019 Acute sinusitis, unspecified: Secondary | ICD-10-CM

## 2014-05-12 MED ORDER — ALBUTEROL SULFATE HFA 108 (90 BASE) MCG/ACT IN AERS: 2.0000 | INHALATION_SPRAY | Freq: Four times a day (QID) | RESPIRATORY_TRACT | Status: DC | PRN

## 2014-05-12 MED ORDER — LEVOFLOXACIN 500 MG PO TABS: 500.0000 mg | ORAL_TABLET | Freq: Every day | ORAL | Status: DC

## 2014-05-12 NOTE — Patient Instructions (Signed)
mucinex (or mucinex DM) in the am and robitussin (or robitussin DM) in the evening.    Saline nasal spray - flush nose at least 2-3x/day  flonase nasal spray - 2 sprays each nostril one time per day.  Do this in the evening.

## 2014-05-12 NOTE — Progress Notes (Signed)
Pre visit review using our clinic review tool, if applicable. No additional management support is needed unless otherwise documented below in the visit note. 

## 2014-05-12 NOTE — Telephone Encounter (Signed)
The patient called and stated she was still having problems with her allergy and sinus issues.  She is hoping to be worked in for this problem.

## 2014-05-12 NOTE — Telephone Encounter (Signed)
See if she can come in at 4:15 today.  May have to wait.

## 2014-05-12 NOTE — Telephone Encounter (Signed)
Please advise (No appt available in our office today)

## 2014-05-16 ENCOUNTER — Encounter: Payer: Self-pay | Admitting: Internal Medicine

## 2014-05-16 DIAGNOSIS — J019 Acute sinusitis, unspecified: Secondary | ICD-10-CM | POA: Insufficient documentation

## 2014-05-16 NOTE — Assessment & Plan Note (Signed)
Symptoms and exam as outlined.  Also with increased cough and congestion.  Continue flonase.  Start saline nasal spray and flush 2-3x/day.  mucinex DM in the am and robitussin DM in the evening. Stay hydrated.  Follow.

## 2014-05-16 NOTE — Progress Notes (Signed)
  Subjective:    Patient ID: Katie [name], female    DOB: July 05, 1966, 48 y.o.   MRN: [id number]  Sinusitis  48 year old female with past history of endometriosis s/p hysterectomy, nephrolithiasis and migraine headaches who comes in today as a work in with concerns regarding  Increased sinus pressure, sore throat, productive cough.  Has issues with reoccurring sinus infections.  States this started several days ago.  Knot in her throat and then sore throat.  Some sneezing initially.  Now with increased sinus pressure.  Ears hurt.  Cough productive of yellow mucus.  No vomiting.  Eating and drinking.  Taking alka seltzer cold and using flonase.         Past Medical History  Diagnosis Date  . Kidney stones   . Migraines   . Endometriosis   . Ovarian cyst     Outpatient Encounter Prescriptions as of 05/12/2014  Medication Sig  . albuterol (PROVENTIL HFA;VENTOLIN HFA) 108 (90 BASE) MCG/ACT inhaler Inhale 2 puffs into the lungs every 6 (six) hours as needed for wheezing or shortness of breath.  . levofloxacin (LEVAQUIN) 500 MG tablet Take 1 tablet (500 mg total) by mouth daily.  . [DISCONTINUED] cefUROXime (CEFTIN) 250 MG tablet Take 1 tablet (250 mg total) by mouth 2 (two) times daily with a meal.    Review of Systems increased sinus pressure as outlined.  Sore throat,  Ears hurt.   No lightheadedness or dizziness. No chest pain, tightness or palpitations.  No increased shortness of breath.  Does report increased congestion and cough productive of yellow mucus. No nausea or vomiting.  No acid reflux.  Eating and drinking.  No diarrhea.       Objective:   Physical Exam  Filed Vitals:   05/12/14 1613  BP: 118/80  Pulse: 94  Temp: 98.6 F (5337 C)   48 year old female in no acute distress.   HEENT:  Nares- erythematous turbinates.   Oropharynx - without lesions.  TMs visualized - without erythema.  Minimal tenderness to palpation over the sinuses.   NECK:  Supple.  Nontender.  HEART:   Appears to be regular. LUNGS:  No crackles or wheezing audible.  Respirations even and unlabored.          Assessment & Plan:   Problem List Items Addressed This Visit   Sinusitis, acute - Primary     Symptoms and exam as outlined.  Also with increased cough and congestion.  Continue flonase.  Start saline nasal spray and flush 2-3x/day.  mucinex DM in the am and robitussin DM in the evening. Stay hydrated.  Follow.       Relevant Medications      levofloxacin (LEVAQUIN) tablet

## 2014-06-07 ENCOUNTER — Encounter: Payer: Self-pay | Admitting: Internal Medicine

## 2014-06-10 ENCOUNTER — Encounter: Payer: Self-pay | Admitting: Internal Medicine

## 2014-06-22 ENCOUNTER — Telehealth: Payer: Self-pay | Admitting: Internal Medicine

## 2014-06-22 NOTE — Telephone Encounter (Addendum)
Pt called to schedule appt for back/shoulder pain and digestion. Please advise where to add to schedule. Pt also request to have a lab orders for [facility]

## 2014-06-23 ENCOUNTER — Encounter: Payer: Self-pay | Admitting: Nurse Practitioner

## 2014-06-23 ENCOUNTER — Ambulatory Visit (INDEPENDENT_AMBULATORY_CARE_PROVIDER_SITE_OTHER): Payer: Managed Care, Other (non HMO) | Admitting: Nurse Practitioner

## 2014-06-23 VITALS — BP 122/78 | HR 105 | Temp 97.9°F | Resp 14 | Ht 66.0 in | Wt 221.2 lb

## 2014-06-23 DIAGNOSIS — R079 Chest pain, unspecified: Secondary | ICD-10-CM | POA: Insufficient documentation

## 2014-06-23 DIAGNOSIS — R1013 Epigastric pain: Secondary | ICD-10-CM | POA: Insufficient documentation

## 2014-06-23 MED ORDER — ESOMEPRAZOLE MAGNESIUM 40 MG PO CPDR: 40.0000 mg | DELAYED_RELEASE_CAPSULE | Freq: Every day | ORAL | Status: DC

## 2014-06-23 NOTE — Progress Notes (Signed)
Pre visit review using our clinic review tool, if applicable. No additional management support is needed unless otherwise documented below in the visit note. 

## 2014-06-23 NOTE — Telephone Encounter (Signed)
Spoke with pt, she states she has had indigestion that radiates to back between shoulder blades.  She also reports having loose stools.  Symptoms started on Sunday.  Pt scheduled with Lyla Sonarrie at 2pm today.

## 2014-06-23 NOTE — Patient Instructions (Signed)
We will follow up after blood work results come back. If you have another episode please seek care emergently (Urgent Care/ER) Nexium was sent to your pharmacy Please follow up in Feb. With your new GI provider.

## 2014-06-23 NOTE — Assessment & Plan Note (Signed)
EKG performed and reviewed. NSR. She is in no distress and has not had similar symptoms since this past Sun/Mon. We discussed doing further cardiac work up and she was reluctant due to the fact that this sounds GI related. Further discussed was the need for emergent care if she has another "episode" for further workup. She verbalized understanding and agreed to this.

## 2014-06-23 NOTE — Telephone Encounter (Signed)
Need to know specific symptoms.  States digestion.  Not sure if not able to eat, vomiting, etc.  If acute symptoms, will need eval today and I am already working in two patients during lunch.  Not sure if anyone else here has openings.  if acute and no openings, to acute care or [facility].  Just let me know.  Thanks.

## 2014-06-23 NOTE — Telephone Encounter (Signed)
Please advise 

## 2014-06-23 NOTE — Telephone Encounter (Signed)
Reviewed note.  Spoke to [employer].  No acute symptoms now.

## 2014-06-23 NOTE — Assessment & Plan Note (Signed)
Gave pt requisition to have labs done at [facility] her place of employment. She will obtain CBC w/ diff, Hepatic function panel, and amylase. She is to still continue with the appointment with GI in feb. Rx for Nexium 40 mg daily in the meantime. Discussed calling us if symptoms change, worsen, or fail to improve. She was told to call for EMS if chest pain occurs, sweating, or excessive fatigue.

## 2014-06-23 NOTE — Progress Notes (Signed)
Subjective:    Patient ID: Katie [name], female    DOB: 1966-04-08, 48 y.o.   MRN: [id number]  HPI  Katie [name] is a 48 yo female here today for CC of indigestion/Chest pain.   1) Sunday evening - "chest pain", indigestion in mid chest felt like food was stuck in lower throat, pain went to between shoulder blades, stayed from Sunday to Monday evenings. Her chest hurt worse when lying flat so she slept in a chair Sunday night. The indigestion comes back to a lesser degree intermittently. She states she has had a lot of belching esp. During that episode. After she eats has epigastric pain most times. Goes away before next meal. She has had a considerable past with GI issues.    Did not eat breakfast on Sunday, Lunch was pepparoni pizza and salad with water. Raman noodles for dinner. Her pain came on in the late afternoon early evening. She talked with her husband about going to the ED, but decided it was GI related and did not pursue tx at that time.  Tried with this episode: Tums- Helpful somewhat Water- helpful In the past has tried:  Probiotics- OTC and was not helpful x 1 month  Nexium- was helpful.    2) Loose stools with change to constipation-  Does not do corn/popcorn/cheese -trigger foods for loose stools Belches often BM- Went x2 on Sunday and x2 on Monday and sometimes doesn't go for one week.    Appointment Vevelyn Pathristiane London, NP at [facility] in Feb. Saw Dr. Romeo AppleHarrison in past before leaving. She states had an Abdominal US on 04/21/14. Unsure of results. She may be confusing this with the US of soft tissue head/neck on that same day.   CT abdomen/HIDA scan/ MRI/US Abdomen- Came back negative in past     Review of Systems  Eyes: Negative for visual disturbance.  Respiratory: Negative for cough, choking, chest tightness, shortness of breath and wheezing.   Cardiovascular: Positive for chest pain. Negative for palpitations and leg swelling.       Chest pain was described as  mid sternum and as discomfort.   Gastrointestinal: Positive for abdominal pain. Negative for nausea, vomiting, diarrhea, blood in stool and abdominal distention.       Epigastric discomfort and belching  Genitourinary: Negative for dysuria.  Musculoskeletal: Negative for myalgias and arthralgias.  Skin: Negative for rash.  Neurological: Negative for dizziness, syncope, weakness and headaches.   Past Medical History  Diagnosis Date  . Kidney stones   . Migraines   . Endometriosis   . Ovarian cyst     History   Social History  . Marital Status: Married    Spouse Name: N/A    Number of Children: 2  . Years of Education: N/A   Occupational History  . Not on file.   Social History Main Topics  . Smoking status: Never Smoker   . Smokeless tobacco: Never Used  . Alcohol Use: No  . Drug Use: No  . Sexual Activity: Not on file   Other Topics Concern  . Not on file   Social History Narrative    Past Surgical History  Procedure Laterality Date  . Appendectomy  2000  . Abdominal hysterectomy  2000    left oophorectomy, Dr Mia CreekBerliner  . Right oophorectomy  2002  . Cesarean section    . Vaginal birth after cesarean section      Family History  Problem Relation Age of Onset  .  Hypertension Mother   . Diabetes Mother   . Thyroid cancer Mother   . Colon polyps Mother   . Thyroid cancer Maternal Aunt   . Colon polyps Maternal Aunt     Allergies  Allergen Reactions  . Doxycycline     N/V diarrhea  . Shellfish Allergy   . Amoxicillin Rash  . Codeine Other (See Comments)    Makes her feel disoriented  . Penicillins Rash    Current Outpatient Prescriptions on File Prior to Visit  Medication Sig Dispense Refill  . albuterol (PROVENTIL HFA;VENTOLIN HFA) 108 (90 BASE) MCG/ACT inhaler Inhale 2 puffs into the lungs every 6 (six) hours as needed for wheezing or shortness of breath. 1 Inhaler 0   No current facility-administered medications on file prior to visit.          Objective:   Physical Exam  Constitutional: She is oriented to person, place, and time. She appears well-developed and well-nourished. No distress.  HENT:  Head: Normocephalic and atraumatic.  Cardiovascular: Normal rate, regular rhythm and normal heart sounds.  Exam reveals no gallop and no friction rub.   No murmur heard. Pulmonary/Chest: Effort normal and breath sounds normal. No respiratory distress. She has no wheezes. She has no rales. She exhibits no tenderness.  Abdominal: Soft. Bowel sounds are normal. She exhibits no distension and no mass. There is no tenderness. There is no rebound and no guarding.  Neurological: She is alert and oriented to person, place, and time.  Skin: Skin is warm and dry. She is not diaphoretic.        BP 122/78 mmHg  Pulse 105  Temp(Src) 97.9 F (36.6 C) (Oral)  Resp 14  Ht 5\' 6"  (1.676 m)  Wt 221 lb 4 oz (100.358 kg)  BMI 35.73 kg/m2  SpO2 96%  LMP 08/27/1998 Repeat pulse 85    Assessment & Plan:  EKG performed and read- sinus rhythm today

## 2014-06-28 ENCOUNTER — Telehealth: Payer: Self-pay | Admitting: Nurse Practitioner

## 2014-06-28 NOTE — Telephone Encounter (Signed)
Please call Katie [name] and let her know her lab results. She will need to be seen by GI (appointment in feb). Thanks, CD

## 2014-06-28 NOTE — Telephone Encounter (Signed)
Left message, notifying patient. 

## 2014-07-03 ENCOUNTER — Emergency Department: Payer: Self-pay | Admitting: Internal Medicine

## 2014-07-03 LAB — COMPREHENSIVE METABOLIC PANEL
ALBUMIN: 3.7 g/dL (ref 3.4–5.0)
ALK PHOS: 139 U/L — AB
ALT: 53 U/L
ANION GAP: 5 — AB (ref 7–16)
BILIRUBIN TOTAL: 0.6 mg/dL (ref 0.2–1.0)
BUN: 13 mg/dL (ref 7–18)
CALCIUM: 8.7 mg/dL (ref 8.5–10.1)
Chloride: 107 mmol/L (ref 98–107)
Co2: 29 mmol/L (ref 21–32)
Creatinine: 0.75 mg/dL (ref 0.60–1.30)
Glucose: 100 mg/dL — ABNORMAL HIGH (ref 65–99)
Osmolality: 281 (ref 275–301)
POTASSIUM: 3.9 mmol/L (ref 3.5–5.1)
SGOT(AST): 43 U/L — ABNORMAL HIGH (ref 15–37)
Sodium: 141 mmol/L (ref 136–145)
Total Protein: 7.3 g/dL (ref 6.4–8.2)

## 2014-07-03 LAB — CBC WITH DIFFERENTIAL/PLATELET
BASOS ABS: 0 10*3/uL (ref 0.0–0.1)
BASOS PCT: 0.7 %
EOS ABS: 0.1 10*3/uL (ref 0.0–0.7)
EOS PCT: 2.1 %
HCT: 35.6 % (ref 35.0–47.0)
HGB: 11.8 g/dL — ABNORMAL LOW (ref 12.0–16.0)
LYMPHS PCT: 28.4 %
Lymphocyte #: 2 10*3/uL (ref 1.0–3.6)
MCH: 27.4 pg (ref 26.0–34.0)
MCHC: 33.1 g/dL (ref 32.0–36.0)
MCV: 83 fL (ref 80–100)
Monocyte #: 0.4 x10 3/mm (ref 0.2–0.9)
Monocyte %: 6 %
NEUTROS PCT: 62.8 %
Neutrophil #: 4.4 10*3/uL (ref 1.4–6.5)
Platelet: 193 10*3/uL (ref 150–440)
RBC: 4.3 10*6/uL (ref 3.80–5.20)
RDW: 14.9 % — AB (ref 11.5–14.5)
WBC: 7 10*3/uL (ref 3.6–11.0)

## 2014-07-03 LAB — URINALYSIS, COMPLETE
BACTERIA: NONE SEEN
Bilirubin,UR: NEGATIVE
GLUCOSE, UR: NEGATIVE mg/dL (ref 0–75)
Ketone: NEGATIVE
LEUKOCYTE ESTERASE: NEGATIVE
Nitrite: NEGATIVE
PH: 6 (ref 4.5–8.0)
PROTEIN: NEGATIVE
RBC,UR: 2 /HPF (ref 0–5)
Specific Gravity: 1.017 (ref 1.003–1.030)
WBC UR: 1 /HPF (ref 0–5)

## 2014-07-03 LAB — LIPASE, BLOOD: LIPASE: 93 U/L (ref 73–393)

## 2014-07-03 LAB — TROPONIN I

## 2014-07-07 ENCOUNTER — Encounter: Payer: Self-pay | Admitting: Nurse Practitioner

## 2014-08-26 ENCOUNTER — Ambulatory Visit: Payer: Self-pay | Admitting: Gastroenterology

## 2014-08-29 LAB — HM COLONOSCOPY

## 2014-09-12 ENCOUNTER — Encounter: Payer: Self-pay | Admitting: Internal Medicine

## 2014-09-28 ENCOUNTER — Ambulatory Visit: Payer: Self-pay | Admitting: Gastroenterology

## 2014-11-14 LAB — SURGICAL PATHOLOGY

## 2015-01-26 ENCOUNTER — Ambulatory Visit (INDEPENDENT_AMBULATORY_CARE_PROVIDER_SITE_OTHER): Payer: Managed Care, Other (non HMO) | Admitting: Internal Medicine

## 2015-01-26 ENCOUNTER — Encounter: Payer: Self-pay | Admitting: Internal Medicine

## 2015-01-26 VITALS — BP 113/80 | HR 89 | Temp 98.1°F | Resp 18 | Ht 66.0 in | Wt 226.0 lb

## 2015-01-26 DIAGNOSIS — M545 Low back pain: Secondary | ICD-10-CM | POA: Diagnosis not present

## 2015-01-26 DIAGNOSIS — N200 Calculus of kidney: Secondary | ICD-10-CM | POA: Diagnosis not present

## 2015-01-26 DIAGNOSIS — R350 Frequency of micturition: Secondary | ICD-10-CM | POA: Diagnosis not present

## 2015-01-26 LAB — POCT URINALYSIS DIPSTICK
BILIRUBIN UA: NEGATIVE
Blood, UA: NEGATIVE
GLUCOSE UA: 100
Leukocytes, UA: NEGATIVE
NITRITE UA: POSITIVE
Protein, UA: 30
Spec Grav, UA: 1.025
Urobilinogen, UA: 2
pH, UA: 5.5

## 2015-01-26 MED ORDER — CIPROFLOXACIN HCL 500 MG PO TABS: 500.0000 mg | ORAL_TABLET | Freq: Two times a day (BID) | ORAL | Status: DC

## 2015-01-26 NOTE — Progress Notes (Signed)
Patient ID: Katie [name], female   DOB: 09/28/1965, 49 y.o.   MRN: [id number]   Subjective:    Patient ID: Katie [name], female    DOB: 05-Mar-1966, 49 y.o.   MRN: [id number]  HPI  Patient here as a work in with concerns regarding a possible urinary tract infection.  Symptoms started last Thursday.  Increased urinary frequency and dysuria.  Started AZO last night.  No fever.  Some low back discomfort.  Has a history of kidney stones.  This does not feel similar to her kidney stone pain.  Feels like uti.  No nausea or vomiting. Has been diagnosed with IBS.    Past Medical History  Diagnosis Date  . Kidney stones   . Migraines   . Endometriosis   . Ovarian cyst     Current Outpatient Prescriptions on File Prior to Visit  Medication Sig Dispense Refill  . esomeprazole (NEXIUM) 40 MG capsule Take 1 capsule (40 mg total) by mouth daily. 30 capsule 3   No current facility-administered medications on file prior to visit.    Review of Systems  Constitutional: Negative for fever and appetite change.  Respiratory: Negative for cough.   Gastrointestinal: Negative for nausea, vomiting and abdominal pain.  Genitourinary: Positive for dysuria and frequency. Negative for vaginal discharge.  Musculoskeletal: Positive for back pain (low back pain).       Objective:    Physical Exam  Constitutional: She appears well-developed and well-nourished. No distress.  Cardiovascular: Normal rate and regular rhythm.   Pulmonary/Chest: Breath sounds normal. No respiratory distress. She has no wheezes.  Abdominal: Soft. Bowel sounds are normal. There is no tenderness.  Musculoskeletal:  No CVA tenderness.     BP 113/80 mmHg  Pulse 89  Temp(Src) 98.1 F (36.7 C) (Oral)  Resp 18  Ht  (1.676 m)  Wt 226 lb (102.513 kg)  BMI 36.49 kg/m2  SpO2 98%  LMP 08/27/1998 Wt Readings from Last 3 Encounters:  01/26/15 226 lb (102.513 kg)  06/23/14 221 lb 4 oz (100.358 kg)  05/12/14 219 lb (99.338  kg)     Lab Results  Component Value Date   WBC 7.0 07/03/2014   HGB 11.8* 07/03/2014   HCT 35.6 07/03/2014   PLT 193 07/03/2014   GLUCOSE 100* 07/03/2014   CHOL 171 03/05/2014   TRIG 83 03/05/2014   HDL 43 03/05/2014   LDLCALC 111 03/05/2014   ALT 53 07/03/2014   AST 43* 07/03/2014   NA 141 07/03/2014   K 3.9 07/03/2014   CL 107 07/03/2014   CREATININE 0.75 07/03/2014   BUN 13 07/03/2014   CO2 29 07/03/2014   TSH 1.06 03/05/2014       Assessment & Plan:   Problem List Items Addressed This Visit    Nephrolithiasis    Has a history of kidney stones.  Does not feel similar.  Follow.  Treat uti.        Urinary frequency - Primary    Urinary frequency associated with dysuria.  Urine dip as outlined.  Will send for culture.  Treat with cipro as directed.  Stay hydrated.  Follow.        Relevant Orders   POCT Urinalysis Dipstick (Completed)   Urine culture (Completed)    Other Visit Diagnoses    Low back pain without sciatica, unspecified back pain laterality        Relevant Orders    POCT Urinalysis Dipstick (Completed)    Urine culture (  Completed)        Dale DurhamSCOTT, Jordell Outten, MD

## 2015-01-26 NOTE — Progress Notes (Signed)
Pre visit review using our clinic review tool, if applicable. No additional management support is needed unless otherwise documented below in the visit note. 

## 2015-01-28 LAB — URINE CULTURE
COLONY COUNT: NO GROWTH
ORGANISM ID, BACTERIA: NO GROWTH

## 2015-01-29 ENCOUNTER — Encounter: Payer: Self-pay | Admitting: Internal Medicine

## 2015-01-29 ENCOUNTER — Other Ambulatory Visit: Payer: Self-pay | Admitting: Internal Medicine

## 2015-01-29 DIAGNOSIS — R350 Frequency of micturition: Secondary | ICD-10-CM | POA: Insufficient documentation

## 2015-01-29 DIAGNOSIS — R319 Hematuria, unspecified: Secondary | ICD-10-CM

## 2015-01-29 NOTE — Assessment & Plan Note (Signed)
Urinary frequency associated with dysuria.  Urine dip as outlined.  Will send for culture.  Treat with cipro as directed.  Stay hydrated.  Follow.

## 2015-01-29 NOTE — Assessment & Plan Note (Signed)
Has a history of kidney stones.  Does not feel similar.  Follow.  Treat uti.

## 2015-01-29 NOTE — Progress Notes (Signed)
Order placed for f/u urinalysis 

## 2015-02-10 ENCOUNTER — Other Ambulatory Visit (INDEPENDENT_AMBULATORY_CARE_PROVIDER_SITE_OTHER): Payer: Managed Care, Other (non HMO)

## 2015-02-10 DIAGNOSIS — R319 Hematuria, unspecified: Secondary | ICD-10-CM | POA: Diagnosis not present

## 2015-02-10 LAB — URINALYSIS, ROUTINE W REFLEX MICROSCOPIC
BILIRUBIN URINE: NEGATIVE
Ketones, ur: NEGATIVE
Leukocytes, UA: NEGATIVE
Nitrite: NEGATIVE
PH: 6 (ref 5.0–8.0)
SPECIFIC GRAVITY, URINE: 1.025 (ref 1.000–1.030)
Total Protein, Urine: NEGATIVE
URINE GLUCOSE: NEGATIVE
Urobilinogen, UA: 1 (ref 0.0–1.0)

## 2015-02-12 ENCOUNTER — Encounter: Payer: Self-pay | Admitting: Internal Medicine

## 2015-02-12 DIAGNOSIS — Z86010 Personal history of colonic polyps: Secondary | ICD-10-CM | POA: Insufficient documentation

## 2015-02-12 DIAGNOSIS — K219 Gastro-esophageal reflux disease without esophagitis: Secondary | ICD-10-CM | POA: Insufficient documentation

## 2015-03-10 ENCOUNTER — Encounter: Payer: Managed Care, Other (non HMO) | Admitting: Internal Medicine

## 2015-03-14 ENCOUNTER — Ambulatory Visit (INDEPENDENT_AMBULATORY_CARE_PROVIDER_SITE_OTHER): Payer: Managed Care, Other (non HMO) | Admitting: Internal Medicine

## 2015-03-14 ENCOUNTER — Other Ambulatory Visit (HOSPITAL_COMMUNITY)
Admission: RE | Admit: 2015-03-14 | Discharge: 2015-03-14 | Disposition: A | Discharge status: Home / Self Care | Payer: Managed Care, Other (non HMO) | Source: Ambulatory Visit | Attending: Internal Medicine | Admitting: Internal Medicine

## 2015-03-14 ENCOUNTER — Encounter: Payer: Self-pay | Admitting: Internal Medicine

## 2015-03-14 ENCOUNTER — Encounter (INDEPENDENT_AMBULATORY_CARE_PROVIDER_SITE_OTHER): Payer: Self-pay

## 2015-03-14 VITALS — BP 110/70 | HR 88 | Temp 98.2°F | Ht 66.0 in | Wt 224.2 lb

## 2015-03-14 DIAGNOSIS — E0789 Other specified disorders of thyroid: Secondary | ICD-10-CM | POA: Diagnosis not present

## 2015-03-14 DIAGNOSIS — N200 Calculus of kidney: Secondary | ICD-10-CM

## 2015-03-14 DIAGNOSIS — Z83719 Family history of colon polyps, unspecified: Secondary | ICD-10-CM

## 2015-03-14 DIAGNOSIS — K219 Gastro-esophageal reflux disease without esophagitis: Secondary | ICD-10-CM

## 2015-03-14 DIAGNOSIS — Z124 Encounter for screening for malignant neoplasm of cervix: Secondary | ICD-10-CM

## 2015-03-14 DIAGNOSIS — R7989 Other specified abnormal findings of blood chemistry: Secondary | ICD-10-CM

## 2015-03-14 DIAGNOSIS — Z1151 Encounter for screening for human papillomavirus (HPV): Secondary | ICD-10-CM | POA: Insufficient documentation

## 2015-03-14 DIAGNOSIS — Z01419 Encounter for gynecological examination (general) (routine) without abnormal findings: Secondary | ICD-10-CM | POA: Diagnosis not present

## 2015-03-14 DIAGNOSIS — Z0000 Encounter for general adult medical examination without abnormal findings: Secondary | ICD-10-CM

## 2015-03-14 DIAGNOSIS — Z860100 Personal history of colon polyps, unspecified: Secondary | ICD-10-CM

## 2015-03-14 DIAGNOSIS — N809 Endometriosis, unspecified: Secondary | ICD-10-CM

## 2015-03-14 DIAGNOSIS — R945 Abnormal results of liver function studies: Secondary | ICD-10-CM

## 2015-03-14 DIAGNOSIS — Z8371 Family history of colonic polyps: Secondary | ICD-10-CM

## 2015-03-14 LAB — LIPID PANEL
Cholesterol: 192 mg/dL (ref 0–200)
HDL: 54 mg/dL (ref 35–70)
LDL CALC: 122 mg/dL
Triglycerides: 80 mg/dL (ref 40–160)

## 2015-03-14 LAB — HEMOGLOBIN A1C: HEMOGLOBIN A1C: 5.3 % (ref 4.0–6.0)

## 2015-03-14 LAB — BASIC METABOLIC PANEL: Creatinine: 0.6 mg/dL (ref 0.5–1.1)

## 2015-03-14 NOTE — Progress Notes (Signed)
Patient ID: ROSABELL [name], female   DOB: 1966/03/19, 49 y.o.   MRN: [id number]   Subjective:    Patient ID: Katie Katie [name], female    DOB: 11/27/1965, 49 y.o.   MRN: [id number]  HPI  Patient here to follow up on her current medical issues and for her exam.  She is trying to stay active.  No cardiac symptoms with increased activity or exertion.  No sob.  Eating and drinking well.  No nausea or vomiting.  Bowels stable.     Past Medical History  Diagnosis Date  . Kidney stones   . Migraines   . Endometriosis   . Ovarian cyst    Past Surgical History  Procedure Laterality Date  . Appendectomy  2000  . Abdominal hysterectomy  2000    left oophorectomy, Dr Katie Katie [name]  . Right oophorectomy  2002  . Cesarean section    . Vaginal birth after cesarean section     Family History  Problem Relation Age of Onset  . Hypertension Mother   . Diabetes Mother   . Thyroid cancer Mother   . Colon polyps Mother   . Thyroid cancer Maternal Aunt   . Colon polyps Maternal Aunt    Social History   Social History  . Marital Status: Married    Spouse Name: N/A  . Number of Children: 2  . Years of Education: N/A   Social History Main Topics  . Smoking status: Never Smoker   . Smokeless tobacco: Never Used  . Alcohol Use: No  . Drug Use: No  . Sexual Activity: Not Asked   Other Topics Concern  . None   Social History Narrative    Outpatient Encounter Prescriptions as of 03/14/2015  Medication Sig  . [DISCONTINUED] ciprofloxacin (CIPRO) 500 MG tablet Take 1 tablet (500 mg total) by mouth 2 (two) times daily.  . [DISCONTINUED] esomeprazole (NEXIUM) 40 MG capsule Take 1 capsule (40 mg total) by mouth daily.  . [DISCONTINUED] Phenazopyridine HCl (AZO TABS PO) Take by mouth.   No facility-administered encounter medications on file as of 03/14/2015.    Review of Systems  Constitutional: Positive for fatigue. Negative for appetite change and unexpected weight change.  HENT: Negative for  congestion and sinus pressure.   Eyes: Negative for pain and visual disturbance.  Respiratory: Negative for cough, chest tightness and shortness of breath.   Cardiovascular: Negative for chest pain, palpitations and leg swelling.  Gastrointestinal: Negative for nausea, vomiting, abdominal pain and diarrhea.  Genitourinary: Negative for dysuria and difficulty urinating.  Musculoskeletal: Negative for back pain and joint swelling.  Skin: Negative for color change and rash.  Neurological: Negative for dizziness, light-headedness and headaches.  Hematological: Negative for adenopathy. Does not bruise/bleed easily.  Psychiatric/Behavioral: Negative for dysphoric mood and agitation.       Objective:     Blood pressure rechecked by me:  110/64  Physical Exam  Constitutional: She is oriented to person, place, and time. She appears well-developed and well-nourished. No distress.  HENT:  Nose: Nose normal.  Mouth/Throat: Oropharynx is clear and moist.  Eyes: Right Katie [name] exhibits no discharge. Left Katie [name] exhibits no discharge. No scleral icterus.  Neck: Neck supple. No thyromegaly present.  Cardiovascular: Normal rate and regular rhythm.   Pulmonary/Chest: Breath sounds normal. No accessory muscle usage. No tachypnea. No respiratory distress. She has no decreased breath sounds. She has no wheezes. She has no rhonchi. Right breast exhibits no inverted nipple, no mass, no nipple discharge and  no tenderness (no axillary adenopathy). Left breast exhibits no inverted nipple, no mass, no nipple discharge and no tenderness (no axilarry adenopathy).  Abdominal: Soft. Bowel sounds are normal. There is no tenderness.  Genitourinary:  Normal external genitalia.  Vaginal vault without lesions.  S/p hysterectomy.  Some atrophy changes present.  PAP smear of the vaginal cuff performed.  Could not appreciate any adnexal masses or tenderness.    Musculoskeletal: She exhibits no edema or tenderness.  Lymphadenopathy:     She has no cervical adenopathy.  Neurological: She is alert and oriented to person, place, and time.  Skin: Skin is warm. No rash noted.  Psychiatric: She has a normal mood and affect. Her behavior is normal.    BP 110/70 mmHg  Pulse 88  Temp(Src) 98.2 F (36.8 C) (Oral)  Ht  (1.676 m)  Wt 224 lb 4 oz (101.719 kg)  BMI 36.21 kg/m2  SpO2 96%  LMP 08/27/1998 Wt Readings from Last 3 Encounters:  03/14/15 224 lb 4 oz (101.719 kg)  01/26/15 226 lb (102.513 kg)  06/23/14 221 lb 4 oz (100.358 kg)     Lab Results  Component Value Date   WBC 7.0 07/03/2014   HGB 11.8* 07/03/2014   HCT 35.6 07/03/2014   PLT 193 07/03/2014   GLUCOSE 100* 07/03/2014   CHOL 171 03/05/2014   TRIG 83 03/05/2014   HDL 43 03/05/2014   LDLCALC 111 03/05/2014   ALT 53 07/03/2014   AST 43* 07/03/2014   NA 141 07/03/2014   K 3.9 07/03/2014   CL 107 07/03/2014   CREATININE 0.75 07/03/2014   BUN 13 07/03/2014   CO2 29 07/03/2014   TSH 1.06 03/05/2014       Assessment & Plan:   Problem List Items Addressed This Visit    Abnormal liver function tests    Ultrasound with fatty liver and mild splenomegaly.  Saw GI.  Worked up.  Follow liver function tests.  Has had hepatitis vaccines.        Endometriosis    S/p hysterectomy with oophorectomy.  PAP today.        Family history of colonic polyps    Colonoscopy 08/29/14 - polyps.  Recommended f/u colonoscopy in five years.       GERD (gastroesophageal reflux disease)    EGD 08/26/14 as outlined in overview.  Off nexium.  Doing well.  Follow.        Health care maintenance    Physical 03/14/15.  Mammogram 04/21/14 - Birads I.        History of colonic polyps    Colonoscopy as outlined overview.  Recommended f/u in 5 years.       Nephrolithiasis    Has a history of kidney stones.  Currently doing well.  Follow.        Thyroid fullness    Thyroid ultrasound 04/21/14 - normal.  Follow.         Other Visit Diagnoses    Pap smear for  cervical cancer screening    -  Primary    Relevant Orders    Cytology - PAP (Completed)        Dale [place], MD

## 2015-03-14 NOTE — Progress Notes (Signed)
Pre-visit discussion using our clinic review tool. No additional management support is needed unless otherwise documented below in the visit note.  

## 2015-03-15 LAB — CYTOLOGY - PAP

## 2015-03-19 ENCOUNTER — Encounter: Payer: Self-pay | Admitting: Internal Medicine

## 2015-03-19 DIAGNOSIS — Z0000 Encounter for general adult medical examination without abnormal findings: Secondary | ICD-10-CM | POA: Insufficient documentation

## 2015-03-19 NOTE — Assessment & Plan Note (Signed)
Thyroid ultrasound 04/21/14 - normal.  Follow.

## 2015-03-19 NOTE — Assessment & Plan Note (Signed)
Has a history of kidney stones.  Currently doing well.  Follow.

## 2015-03-19 NOTE — Assessment & Plan Note (Signed)
Ultrasound with fatty liver and mild splenomegaly.  Saw GI.  Worked up.  Follow liver function tests.  Has had hepatitis vaccines.

## 2015-03-19 NOTE — Assessment & Plan Note (Signed)
S/p hysterectomy with oophorectomy.  PAP today.

## 2015-03-19 NOTE — Assessment & Plan Note (Signed)
Physical 03/14/15.  Mammogram 04/21/14 - Birads I.

## 2015-03-19 NOTE — Assessment & Plan Note (Signed)
EGD 08/26/14 as outlined in overview.  Off nexium.  Doing well.  Follow.

## 2015-03-19 NOTE — Assessment & Plan Note (Signed)
Colonoscopy as outlined overview.  Recommended f/u in 5 years.

## 2015-03-19 NOTE — Assessment & Plan Note (Signed)
Colonoscopy 08/29/14 - polyps.  Recommended f/u colonoscopy in five years.   

## 2015-04-13 ENCOUNTER — Telehealth: Payer: Self-pay | Admitting: Internal Medicine

## 2015-04-13 NOTE — Telephone Encounter (Signed)
Pt came in and brought her labs and I put pt labs in Dr. Lorin Picket box. Thank You!

## 2015-04-14 ENCOUNTER — Encounter: Payer: Self-pay | Admitting: Internal Medicine

## 2015-04-14 ENCOUNTER — Encounter: Payer: Managed Care, Other (non HMO) | Admitting: Internal Medicine

## 2015-04-24 ENCOUNTER — Encounter: Payer: Self-pay | Admitting: *Deleted

## 2015-04-24 ENCOUNTER — Ambulatory Visit
Admission: RE | Admit: 2015-04-24 | Discharge: 2015-04-24 | Disposition: A | Discharge status: Home / Self Care | Payer: Managed Care, Other (non HMO) | Source: Ambulatory Visit | Attending: Internal Medicine | Admitting: Internal Medicine

## 2015-04-24 DIAGNOSIS — Z1231 Encounter for screening mammogram for malignant neoplasm of breast: Secondary | ICD-10-CM | POA: Insufficient documentation

## 2015-04-24 DIAGNOSIS — Z1239 Encounter for other screening for malignant neoplasm of breast: Secondary | ICD-10-CM

## 2015-06-29 ENCOUNTER — Ambulatory Visit (INDEPENDENT_AMBULATORY_CARE_PROVIDER_SITE_OTHER): Payer: Managed Care, Other (non HMO) | Admitting: Internal Medicine

## 2015-06-29 ENCOUNTER — Encounter: Payer: Self-pay | Admitting: Internal Medicine

## 2015-06-29 VITALS — BP 122/78 | HR 78 | Temp 97.8°F | Wt 224.5 lb

## 2015-06-29 DIAGNOSIS — J018 Other acute sinusitis: Secondary | ICD-10-CM | POA: Diagnosis not present

## 2015-06-29 DIAGNOSIS — R0789 Other chest pain: Secondary | ICD-10-CM | POA: Diagnosis not present

## 2015-06-29 MED ORDER — LEVOFLOXACIN 500 MG PO TABS: 500.0000 mg | ORAL_TABLET | Freq: Every day | ORAL | Status: DC

## 2015-06-29 NOTE — Patient Instructions (Signed)
Saline nasal spray - flush nose at least 2-3x/day  nasacort nasal spray - 2 sprays each nostril one time per day.  Do this in the evening.    Robitussin twice a day

## 2015-06-29 NOTE — Progress Notes (Signed)
Pre-visit discussion using our clinic review tool. No additional management support is needed unless otherwise documented below in the visit note.  

## 2015-06-29 NOTE — Progress Notes (Signed)
Patient ID: Katie [name], female   DOB: 09-17-1965, 49 y.o.   MRN: [id number]   Subjective:    Patient ID: Katie [name], female    DOB: [birth year], 49 y.o.   MRN: [id number]  HPI  Patient with past history of migraine headaches and endometriosis.  She comes in today as a work in with concerns regarding increased sinus pressure and headache.  Feels like a typical sinus headache.  Left ear fullness.  Taking sudafed and nyquil.  Some increased congestion.   Eyes with some congestion at times.  Has noticed when lying down - some heaviness in her chest.  Did not start until the congestion started.  She is eating and drinking.  Had one episode of emesis when first started, but no vomiting since.  No diarrhea.  Symptoms present for two weeks.     Past Medical History  Diagnosis Date  . Kidney stones   . Migraines   . Endometriosis   . Ovarian cyst    Past Surgical History  Procedure Laterality Date  . Appendectomy  2000  . Abdominal hysterectomy  2000    left oophorectomy, Dr Mia Creek  . Right oophorectomy  2002  . Cesarean section    . Vaginal birth after cesarean section     Family History  Problem Relation Age of Onset  . Hypertension Mother   . Diabetes Mother   . Thyroid cancer Mother   . Colon polyps Mother   . Thyroid cancer Maternal Aunt   . Colon polyps Maternal Aunt    Social History   Social History  . Marital Status: Married    Spouse Name: N/A  . Number of Children: 2  . Years of Education: N/A   Social History Main Topics  . Smoking status: Never Smoker   . Smokeless tobacco: Never Used  . Alcohol Use: No  . Drug Use: No  . Sexual Activity: Not Asked   Other Topics Concern  . None   Social History Narrative    Outpatient Encounter Prescriptions as of 06/29/2015  Medication Sig  . levofloxacin (LEVAQUIN) 500 MG tablet Take 1 tablet (500 mg total) by mouth daily.   No facility-administered encounter medications on file as of 06/29/2015.    Review  of Systems  Constitutional: Negative for appetite change and unexpected weight change.  HENT: Positive for congestion and sinus pressure.   Eyes: Positive for discharge. Negative for pain.  Respiratory:       Has noticed some chest heaviness when lying down.  Will at times hurt to take a deep breath.    Cardiovascular: Negative for palpitations and leg swelling.  Gastrointestinal: Negative for nausea, vomiting (one episode two weeks ago, but none since. ), abdominal pain and diarrhea.  Musculoskeletal: Negative for joint swelling.  Skin: Negative for color change and rash.  Neurological: Positive for headaches. Negative for dizziness and light-headedness.       Objective:    Physical Exam  Constitutional: She appears well-developed and well-nourished. No distress.  Nares -- slightly erythematous turbinates.  Minimal tenderness to palpation over the sinuses.   TMs visualized - no erythema.   HENT:  Mouth/Throat: Oropharynx is clear and moist.  Eyes: Conjunctivae are normal. Right [name] exhibits no discharge. Left [name] exhibits no discharge.  Neck: Neck supple.  Cardiovascular: Normal rate and regular rhythm.   Pulmonary/Chest: Breath sounds normal. No respiratory distress. She has no wheezes.  Lymphadenopathy:    She has no cervical adenopathy.  BP 122/78 mmHg  Pulse 78  Temp(Src) 97.8 F (36.6 C) (Oral)  Wt 224 lb 8 oz (101.833 kg)  SpO2 97%  LMP 08/27/1998 Wt Readings from Last 3 Encounters:  06/29/15 224 lb 8 oz (101.833 kg)  03/14/15 224 lb 4 oz (101.719 kg)  01/26/15 226 lb (102.513 kg)     Lab Results  Component Value Date   WBC 7.0 07/03/2014   HGB 11.8* 07/03/2014   HCT 35.6 07/03/2014   PLT 193 07/03/2014   GLUCOSE 100* 07/03/2014   CHOL 192 03/14/2015   TRIG 80 03/14/2015   HDL 54 03/14/2015   LDLCALC 122 03/14/2015   ALT 53 07/03/2014   AST 43* 07/03/2014   NA 141 07/03/2014   K 3.9 07/03/2014   CL 107 07/03/2014   CREATININE 0.6 03/14/2015   BUN  13 07/03/2014   CO2 29 07/03/2014   TSH 1.06 03/05/2014   HGBA1C 5.3 03/14/2015    Mm Digital Screening Bilateral  04/25/2015  CLINICAL DATA:  Screening. EXAM: DIGITAL SCREENING BILATERAL MAMMOGRAM WITH CAD COMPARISON:  Previous exam(s). ACR Breast Density Category a: The breast tissue is almost entirely fatty. FINDINGS: There are no findings suspicious for malignancy. Images were processed with CAD. IMPRESSION: No mammographic evidence of malignancy. A result letter of this screening mammogram will be mailed directly to the patient. RECOMMENDATION: Screening mammogram in one year. (Code:SM-B-01Y) BI-RADS CATEGORY  1: Negative. Electronically Signed   By: Bary RichardStan  Maynard M.D.   On: 04/25/2015 08:25       Assessment & Plan:   Problem List Items Addressed This Visit    Chest pressure    Occurred with the onset of congestion, etc.  Feel related to the infection.  Discussed with her today.  Doubt cardiac origin.  She agrees and does not feel further cardiac w/up is warranted.  Follow closely.        Sinusitis - Primary    Persistent symptoms as outlined.  Saline nasal spray and nasacort nasal spray as outlined.  Mucinex/robitussin as directed.  levaquin daily as directed.  Follow.  Notify me if persistent or worsening problems.        Relevant Medications   levofloxacin (LEVAQUIN) 500 MG tablet       Dale DurhamSCOTT, Carolynne Schuchard, MD

## 2015-07-02 ENCOUNTER — Encounter: Payer: Self-pay | Admitting: Internal Medicine

## 2015-07-02 DIAGNOSIS — R0789 Other chest pain: Secondary | ICD-10-CM | POA: Insufficient documentation

## 2015-07-02 NOTE — Assessment & Plan Note (Signed)
Occurred with the onset of congestion, etc.  Feel related to the infection.  Discussed with her today.  Doubt cardiac origin.  She agrees and does not feel further cardiac w/up is warranted.  Follow closely.

## 2015-07-02 NOTE — Assessment & Plan Note (Signed)
Persistent symptoms as outlined.  Saline nasal spray and nasacort nasal spray as outlined.  Mucinex/robitussin as directed.  levaquin daily as directed.  Follow.  Notify me if persistent or worsening problems.

## 2015-10-26 ENCOUNTER — Ambulatory Visit (INDEPENDENT_AMBULATORY_CARE_PROVIDER_SITE_OTHER): Payer: Managed Care, Other (non HMO) | Admitting: Nurse Practitioner

## 2015-10-26 ENCOUNTER — Encounter: Payer: Self-pay | Admitting: Nurse Practitioner

## 2015-10-26 VITALS — BP 126/80 | HR 75 | Temp 98.4°F | Ht 66.0 in | Wt 226.2 lb

## 2015-10-26 DIAGNOSIS — J019 Acute sinusitis, unspecified: Secondary | ICD-10-CM | POA: Diagnosis not present

## 2015-10-26 MED ORDER — BENZONATATE 100 MG PO CAPS: 100.0000 mg | ORAL_CAPSULE | Freq: Two times a day (BID) | ORAL | Status: DC | PRN

## 2015-10-26 MED ORDER — CEPHALEXIN 500 MG PO CAPS: 500.0000 mg | ORAL_CAPSULE | Freq: Two times a day (BID) | ORAL | Status: DC

## 2015-10-26 NOTE — Progress Notes (Signed)
Patient ID: Katie [name], female    DOB: 07/16/1966  Age: 50 y.o. MRN: [id number]  CC: Cough; Sinusitis; Ear Fullness; and Chest Congestion   HPI Katie [name] presents for CC of cough/sinusitis/ear fullness/chest congestion 1.5 weeks.   1) [facility]  Fever Tmax 102   Nauseated on Monday- Tuesday  Coughing causing chest pain with coughing, frontal pressure, ear pressure   Treatment to date: Tamiflu and Z-pack completed Mucniex DM- stopped  Robitussin- no cough relief  Flonase   Patient denies sick contacts   History Katie [name] has a past medical history of Kidney stones; Migraines; Endometriosis; and Ovarian cyst.   She has past surgical history that includes Appendectomy (2000); Abdominal hysterectomy (2000); Right oophorectomy (2002); Cesarean section; and Vaginal birth after Cesarean section.   Her family history includes Colon polyps in her maternal aunt and mother; Diabetes in her mother; Hypertension in her mother; Thyroid cancer in her maternal aunt and mother.She reports that she has never smoked. She has never used smokeless tobacco. She reports that she does not drink alcohol or use illicit drugs.  Outpatient Prescriptions Prior to Visit  Medication Sig Dispense Refill  . levofloxacin (LEVAQUIN) 500 MG tablet Take 1 tablet (500 mg total) by mouth daily. 10 tablet 0   No facility-administered medications prior to visit.    ROS Review of Systems  Constitutional: Positive for fatigue. Negative for fever, chills and diaphoresis.  HENT: Positive for congestion, ear pain, postnasal drip, rhinorrhea, sinus pressure and sore throat. Negative for ear discharge, nosebleeds, sneezing, trouble swallowing and voice change.   Respiratory: Positive for cough. Negative for chest tightness, shortness of breath and wheezing.   Cardiovascular: Negative for chest pain, palpitations and leg swelling.  Gastrointestinal:  Positive for nausea. Negative for vomiting and diarrhea.  Skin: Negative for rash.  Neurological: Negative for dizziness and headaches.    Objective:  BP 126/80 mmHg  Pulse 75  Temp(Src) 98.4 F (36.9 C) (Oral)  Ht 5\' 6"  (1.676 m)  Wt 226 lb 4 oz (102.626 kg)  BMI 36.54 kg/m2  SpO2 97%  LMP 08/27/1998  Physical Exam  Constitutional: She is oriented to person, place, and time. She appears well-developed and well-nourished. No distress.  Patient appears ill  HENT:  Head: Normocephalic and atraumatic.  Right Ear: External ear normal.  Left Ear: External ear normal.  Mouth/Throat: Oropharynx is clear and moist. No oropharyngeal exudate.  TMs clear bilaterally Frontal Sinuses tender to percussion Boggy turbinates  Eyes: EOM are normal. Pupils are equal, round, and reactive to light. Right eye exhibits no discharge. Left eye exhibits no discharge. No scleral icterus.  Neck: Normal range of motion. Neck supple.  Cardiovascular: Normal rate and regular rhythm.   Pulmonary/Chest: Effort normal and breath sounds normal. No respiratory distress. She has no wheezes. She has no rales. She exhibits no tenderness.  Lymphadenopathy:    She has no cervical adenopathy.  Neurological: She is alert and oriented to person, place, and time.  Skin: Skin is warm and dry. No rash noted. She is not diaphoretic.  Psychiatric: She has a normal mood and affect. Her behavior is normal. Judgment and thought content normal.   Assessment & Plan:   Katie [name] was seen today for cough, sinusitis, ear fullness and chest congestion.  Diagnoses and all orders for this visit:  Acute sinusitis, recurrence not specified, unspecified location  Other orders -     cephALEXin (KEFLEX) 500  MG capsule; Take 1 capsule (500 mg total) by mouth 2 (two) times daily. -     benzonatate (TESSALON) 100 MG capsule; Take 1 capsule (100 mg total) by mouth 2 (two) times daily as needed for cough.  I have discontinued Ms. Clowdus's  levofloxacin. I am also having her start on cephALEXin and benzonatate.  Meds ordered this encounter  Medications  . cephALEXin (KEFLEX) 500 MG capsule    Sig: Take 1 capsule (500 mg total) by mouth 2 (two) times daily.    Dispense:  14 capsule    Refill:  0    Order Specific Question:  Supervising Provider    Answer:  Duncan Dull L [[id number]]  . benzonatate (TESSALON) 100 MG capsule    Sig: Take 1 capsule (100 mg total) by mouth 2 (two) times daily as needed for cough.    Dispense:  20 capsule    Refill:  0    Order Specific Question:  Supervising Provider    Answer:  Sherlene Shams [[id number]]     Follow-up: Return if symptoms worsen or fail to improve.

## 2015-10-26 NOTE — Progress Notes (Signed)
Pre visit review using our clinic review tool, if applicable. No additional management support is needed unless otherwise documented below in the visit note. 

## 2015-10-26 NOTE — Patient Instructions (Signed)

## 2015-11-03 NOTE — Assessment & Plan Note (Signed)
New Onset Due to length of symptoms with worsening will treat empirically  Keflex 500 mg sent to the pharmacy Encouraged Probiotics 3 weeks Tessalon Perles sent to pharmacy Continue OTC measures  FU prn worsening/failure to improve.

## 2016-02-14 ENCOUNTER — Ambulatory Visit (INDEPENDENT_AMBULATORY_CARE_PROVIDER_SITE_OTHER): Payer: Managed Care, Other (non HMO) | Admitting: Family Medicine

## 2016-02-14 DIAGNOSIS — J019 Acute sinusitis, unspecified: Secondary | ICD-10-CM | POA: Diagnosis not present

## 2016-02-14 MED ORDER — CEPHALEXIN 500 MG PO CAPS: 500.0000 mg | ORAL_CAPSULE | Freq: Two times a day (BID) | ORAL | 0 refills | Status: DC

## 2016-02-14 MED ORDER — FLUTICASONE PROPIONATE 50 MCG/ACT NA SUSP: 2.0000 | Freq: Every day | NASAL | 6 refills | Status: DC

## 2016-02-14 NOTE — Assessment & Plan Note (Signed)
New onset. Given duration of symptoms with lack of improvement we'll treat empirically for bacterial sinusitis. Given her allergies we will treat with Keflex as she has responded to this previously and tolerated it. Discussed precautions for allergic reaction. She'll also use Flonase. She'll continue to monitor. She's given return precautions.

## 2016-02-14 NOTE — Progress Notes (Signed)
Pre visit review using our clinic review tool, if applicable. No additional management support is needed unless otherwise documented below in the visit note. 

## 2016-02-14 NOTE — Progress Notes (Signed)
  Marikay Alar, MD Phone: [phone]  Katie [name] is a 50 y.o. female who presents today for same-day visit.  Patient notes sinus congestion for the last 2 weeks. Started off with postnasal drip and some congestion. Congestion has persisted and gotten slightly worse. Notes blowing clear mucus out of her nose. Some postnasal drip. Some ear fullness. No fevers or cough. Some sneezing. Has tried over-the-counter cold medication and Mucinex with little benefit.   ROS see history of present illness  Objective  Physical Exam Vitals:   02/14/16 1517  BP: 132/84  Pulse: 85  Temp: 98.4 F (36.9 C)    BP Readings from Last 3 Encounters:  02/14/16 132/84  10/26/15 126/80  06/29/15 122/78   Wt Readings from Last 3 Encounters:  02/14/16 226 lb 12.8 oz (102.9 kg)  10/26/15 226 lb 4 oz (102.6 kg)  06/29/15 224 lb 8 oz (101.8 kg)    Physical Exam  Constitutional: No distress.  HENT:  Head: Normocephalic and atraumatic.  Right Ear: External ear normal.  Mouth/Throat: Oropharynx is clear and moist. No oropharyngeal exudate.  Normal TMs bilaterally, no sinus tenderness to percussion  Eyes: Conjunctivae are normal. Pupils are equal, round, and reactive to light.  Neck: Neck supple.  Cardiovascular: Normal rate, regular rhythm and normal heart sounds.   Pulmonary/Chest: Effort normal and breath sounds normal.  Lymphadenopathy:    She has no cervical adenopathy.  Neurological: She is alert. Gait normal.  Skin: Skin is warm and dry. She is not diaphoretic.     Assessment/Plan: Please see individual problem list.  Sinusitis, acute New onset. Given duration of symptoms with lack of improvement we'll treat empirically for bacterial sinusitis. Given her allergies we will treat with Keflex as she has responded to this previously and tolerated it. Discussed precautions for allergic reaction. She'll also use Flonase. She'll continue to monitor. She's given return precautions.   No  orders of the defined types were placed in this encounter.   Meds ordered this encounter  Medications  . cephALEXin (KEFLEX) 500 MG capsule    Sig: Take 1 capsule (500 mg total) by mouth 2 (two) times daily.    Dispense:  14 capsule    Refill:  0  . fluticasone (FLONASE) 50 MCG/ACT nasal spray    Sig: Place 2 sprays into both nostrils daily.    Dispense:  16 g    Refill:  6    Marikay Alar, MD [facility]

## 2016-02-14 NOTE — Patient Instructions (Signed)
Nice to see you. You likely have a sinus infection. We will treat with Keflex as you've responded to this in the past. If you have any rash or tongue or throat swelling or trouble breathing with this please get looked at immediately. If you develop fevers, cough productive of blood, trouble breathing, or any new or changing symptoms please seek medical attention.

## 2016-02-21 ENCOUNTER — Telehealth: Payer: Self-pay | Admitting: Internal Medicine

## 2016-02-21 NOTE — Telephone Encounter (Signed)
Pt called back stating she took her last antibiotic today and still is not feeling better. Now she has a cough and still congested. Last week she did not have a cough. Pt wants to know if something else can be called in or does she have to come back in?  Pharmacy is [facility] - [place], [place] - [address]  Call pt @ [phone] until 5pm. After 5pm [phone]. Thank you!

## 2016-02-21 NOTE — Telephone Encounter (Signed)
Patient needs an appt scheduled, thanks 

## 2016-02-21 NOTE — Telephone Encounter (Signed)
Patient should be re-evaluated.

## 2016-02-21 NOTE — Telephone Encounter (Signed)
Saw Dr. Birdie Sons, given Keflex and flonase to take, new symptoms with cough and congestion remains.  Please advise if she needs to be seen again? thanks

## 2016-02-22 ENCOUNTER — Ambulatory Visit (INDEPENDENT_AMBULATORY_CARE_PROVIDER_SITE_OTHER): Payer: Managed Care, Other (non HMO) | Admitting: Family Medicine

## 2016-02-22 DIAGNOSIS — J019 Acute sinusitis, unspecified: Secondary | ICD-10-CM | POA: Diagnosis not present

## 2016-02-22 MED ORDER — PREDNISONE 20 MG PO TABS: 40.0000 mg | ORAL_TABLET | Freq: Every day | ORAL | 0 refills | Status: DC

## 2016-02-22 MED ORDER — LEVOFLOXACIN 500 MG PO TABS: 500.0000 mg | ORAL_TABLET | Freq: Every day | ORAL | 0 refills | Status: DC

## 2016-02-22 NOTE — Telephone Encounter (Signed)
Noted, thanks!

## 2016-02-22 NOTE — Telephone Encounter (Signed)
Ok. Pt is scheduled for today @ 1:15pm.

## 2016-02-22 NOTE — Progress Notes (Signed)
  Marikay Alar, MD Phone: [phone]  Katie [name] is a 49 y.o. female who presents today for follow-up.  Patient seen a week ago for sinusitis. Notes continued sinus pressure. Not much better. Blowing clear mucus out of her nose. 3 weeks of symptoms. Has started to cough. Cough is nonproductive. No shortness of breath. No fevers. Flonase is helpful. Taking Tussionex DM and Mucinex DM as well. Alternating these 2 medications. Wakes up and feels okay but gets worse throughout the day.   ROS see history of present illness  Objective  Physical Exam Vitals:   02/22/16 1306  BP: 110/70  Temp: 98 F (36.7 C)    BP Readings from Last 3 Encounters:  02/22/16 110/70  02/14/16 132/84  10/26/15 126/80   Wt Readings from Last 3 Encounters:  02/22/16 226 lb 6.4 oz (102.7 kg)  02/14/16 226 lb 12.8 oz (102.9 kg)  10/26/15 226 lb 4 oz (102.6 kg)    Physical Exam  Constitutional: No distress.  HENT:  Head: Normocephalic and atraumatic.  Mouth/Throat: Oropharynx is clear and moist. No oropharyngeal exudate.  Normal TMs bilaterally, maxillary sinuses tender to percussion bilaterally  Eyes: Conjunctivae are normal. Pupils are equal, round, and reactive to light.  Neck: Neck supple.  Cardiovascular: Normal rate, regular rhythm and normal heart sounds.   Pulmonary/Chest: Effort normal and breath sounds normal.  Lymphadenopathy:    She has no cervical adenopathy.  Neurological: She is alert. Gait normal.  Skin: Skin is warm and dry. She is not diaphoretic.     Assessment/Plan: Please see individual problem list.  Sinusitis, acute The duration of symptoms consistent with either bacterial sinusitis or allergies. I discussed next step in treatment would be Levaquin or prednisone. Discussed risk of neuropathy and tendon rupture with Levaquin and patient opted to start with prednisone. She was provided with a Levaquin prescription to fill in 2-3 days if not improving with prednisone.  Advised that if she worsened she should get looked and not fill the Levaquin. She is given return precautions.   No orders of the defined types were placed in this encounter.   Meds ordered this encounter  Medications  . predniSONE (DELTASONE) 20 MG tablet    Sig: Take 2 tablets (40 mg total) by mouth daily with breakfast.    Dispense:  10 tablet    Refill:  0  . levofloxacin (LEVAQUIN) 500 MG tablet    Sig: Take 1 tablet (500 mg total) by mouth daily. Do not fill until 02/24/16    Dispense:  10 tablet    Refill:  0   Marikay Alar, MD [facility]

## 2016-02-22 NOTE — Progress Notes (Signed)
Pre visit review using our clinic review tool, if applicable. No additional management support is needed unless otherwise documented below in the visit note. 

## 2016-02-22 NOTE — Assessment & Plan Note (Signed)
The duration of symptoms consistent with either bacterial sinusitis or allergies. I discussed next step in treatment would be Levaquin or prednisone. Discussed risk of neuropathy and tendon rupture with Levaquin and patient opted to start with prednisone. She was provided with a Levaquin prescription to fill in 2-3 days if not improving with prednisone. Advised that if she worsened she should get looked and not fill the Levaquin. She is given return precautions.

## 2016-02-22 NOTE — Patient Instructions (Signed)
Nice to see you. We will treat your symptoms with prednisone to see if they get better. If you're symptoms do not improve in the next 2-3 days please fill the Levaquin. If you develop shortness of breath, cough productive of blood, fevers, or any new or changing symptoms please seek medical attention.

## 2016-03-15 ENCOUNTER — Ambulatory Visit (INDEPENDENT_AMBULATORY_CARE_PROVIDER_SITE_OTHER): Payer: Managed Care, Other (non HMO) | Admitting: Internal Medicine

## 2016-03-15 ENCOUNTER — Encounter: Payer: Self-pay | Admitting: Internal Medicine

## 2016-03-15 VITALS — BP 100/70 | HR 75 | Temp 97.7°F | Resp 18 | Ht 67.0 in | Wt 225.1 lb

## 2016-03-15 DIAGNOSIS — E0789 Other specified disorders of thyroid: Secondary | ICD-10-CM | POA: Diagnosis not present

## 2016-03-15 DIAGNOSIS — J018 Other acute sinusitis: Secondary | ICD-10-CM | POA: Diagnosis not present

## 2016-03-15 DIAGNOSIS — Z0000 Encounter for general adult medical examination without abnormal findings: Secondary | ICD-10-CM | POA: Diagnosis not present

## 2016-03-15 DIAGNOSIS — R945 Abnormal results of liver function studies: Secondary | ICD-10-CM

## 2016-03-15 DIAGNOSIS — Z86010 Personal history of colonic polyps: Secondary | ICD-10-CM

## 2016-03-15 DIAGNOSIS — Z13220 Encounter for screening for lipoid disorders: Secondary | ICD-10-CM

## 2016-03-15 DIAGNOSIS — R7989 Other specified abnormal findings of blood chemistry: Secondary | ICD-10-CM

## 2016-03-15 DIAGNOSIS — Z1239 Encounter for other screening for malignant neoplasm of breast: Secondary | ICD-10-CM | POA: Diagnosis not present

## 2016-03-15 DIAGNOSIS — L989 Disorder of the skin and subcutaneous tissue, unspecified: Secondary | ICD-10-CM

## 2016-03-15 NOTE — Progress Notes (Signed)
Patient ID: Katie [name], female   DOB: [birth year], 50 y.o.   MRN: [id number]   Subjective:    Patient ID: Katie EyeBernice M Ries, female    DOB: [birth year], 50 y.o.   MRN: [id number]  HPI  Patient here for her physical exam.   She was recently evaluated by Dr Birdie SonsSonnenberg for sinusitis.  See his notes. Last visit was given prednisone.  Also given rx for levaquin.  She never took the abx.  Did take the prednisone.  Is better.  Still with some drainage and some cough.  No sob.  No chest congestion.  Bowels stable.  No increased abdominal pain or cramping.  Handling stress.  Has been under increased stress recently.  Daughter had a baby.  Blood pressure elevated.  Doing better now.  Baby doing well.  Some left breast tenderness at times.  No pain or tenderness today.  No mass or palpable lump.    Past Medical History:  Diagnosis Date  . Endometriosis   . Kidney stones   . Migraines   . Ovarian cyst    Past Surgical History:  Procedure Laterality Date  . ABDOMINAL HYSTERECTOMY  2000   left oophorectomy, Dr Mia CreekBerliner  . APPENDECTOMY  2000  . CESAREAN SECTION    . RIGHT OOPHORECTOMY  2002  . VAGINAL BIRTH AFTER CESAREAN SECTION     Family History  Problem Relation Age of Onset  . Hypertension Mother   . Diabetes Mother   . Thyroid cancer Mother   . Colon polyps Mother   . Thyroid cancer Maternal Aunt   . Colon polyps Maternal Aunt    Social History   Social History  . Marital status: Married    Spouse name: N/A  . Number of children: 2  . Years of education: N/A   Social History Main Topics  . Smoking status: Never Smoker  . Smokeless tobacco: Never Used  . Alcohol use No  . Drug use: No  . Sexual activity: Not Asked   Other Topics Concern  . None   Social History Narrative  . None    Outpatient Encounter Prescriptions as of 03/15/2016  Medication Sig  . fluticasone (FLONASE) 50 MCG/ACT nasal spray Place 2 sprays into both nostrils daily.  . [DISCONTINUED] levofloxacin  (LEVAQUIN) 500 MG tablet Take 1 tablet (500 mg total) by mouth daily. Do not fill until 02/24/16  . [DISCONTINUED] predniSONE (DELTASONE) 20 MG tablet Take 2 tablets (40 mg total) by mouth daily with breakfast.   No facility-administered encounter medications on file as of 03/15/2016.     Review of Systems  Constitutional: Negative for appetite change and unexpected weight change.  HENT: Positive for congestion and postnasal drip. Negative for sinus pressure.   Eyes: Negative for pain and visual disturbance.  Respiratory: Positive for cough. Negative for chest tightness and shortness of breath.   Cardiovascular: Negative for chest pain, palpitations and leg swelling.  Gastrointestinal: Negative for abdominal pain, diarrhea, nausea and vomiting.  Genitourinary: Negative for difficulty urinating and dysuria.  Musculoskeletal: Negative for back pain and joint swelling.  Skin: Negative for color change and rash.  Neurological: Negative for dizziness, light-headedness and headaches.  Hematological: Negative for adenopathy. Does not bruise/bleed easily.  Psychiatric/Behavioral: Negative for agitation and dysphoric mood.       Objective:    Physical Exam  Constitutional: She is oriented to person, place, and time. She appears well-developed and well-nourished. No distress.  HENT:  Mouth/Throat: Oropharynx is clear and moist.  Nares - erythematous turbinates.   Eyes: Conjunctivae are normal. Right eye exhibits no discharge. No scleral icterus.  Neck: Neck supple.  Cardiovascular: Normal rate and regular rhythm.   Pulmonary/Chest: Breath sounds normal. No accessory muscle usage. No tachypnea. No respiratory distress. She has no decreased breath sounds. She has no wheezes. She has no rhonchi. Right breast exhibits no inverted nipple, no mass, no nipple discharge and no tenderness (no axillary adenopathy). Left breast exhibits no inverted nipple, no mass, no nipple discharge and no tenderness (no  axilarry adenopathy).  Abdominal: Soft. Bowel sounds are normal. There is no tenderness.  Musculoskeletal: She exhibits no edema or tenderness.  Lymphadenopathy:    She has no cervical adenopathy.  Neurological: She is alert and oriented to person, place, and time.  Skin: Skin is warm. No rash noted. No erythema.  Psychiatric: She has a normal mood and affect. Her behavior is normal.    BP 100/70   Pulse 75   Temp 97.7 F (36.5 C) (Oral)   Resp 18   Ht 5\' 7"  (1.702 m)   Wt 225 lb 2 oz (102.1 kg)   LMP 08/27/1998   SpO2 97%   BMI 35.26 kg/m  Wt Readings from Last 3 Encounters:  03/15/16 225 lb 2 oz (102.1 kg)  02/22/16 226 lb 6.4 oz (102.7 kg)  02/14/16 226 lb 12.8 oz (102.9 kg)     Lab Results  Component Value Date   WBC 7.0 07/03/2014   HGB 11.8 (L) 07/03/2014   HCT 35.6 07/03/2014   PLT 193 07/03/2014   GLUCOSE 100 (H) 07/03/2014   CHOL 192 03/14/2015   TRIG 80 03/14/2015   HDL 54 03/14/2015   LDLCALC 122 03/14/2015   ALT 53 07/03/2014   AST 43 (H) 07/03/2014   NA 141 07/03/2014   K 3.9 07/03/2014   CL 107 07/03/2014   CREATININE 0.6 03/14/2015   BUN 13 07/03/2014   CO2 29 07/03/2014   TSH 1.06 03/05/2014   HGBA1C 5.3 03/14/2015    Mm Digital Screening Bilateral  Result Date: 04/25/2015 CLINICAL DATA:  Screening. EXAM: DIGITAL SCREENING BILATERAL MAMMOGRAM WITH CAD COMPARISON:  Previous exam(s). ACR Breast Density Category a: The breast tissue is almost entirely fatty. FINDINGS: There are no findings suspicious for malignancy. Images were processed with CAD. IMPRESSION: No mammographic evidence of malignancy. A result letter of this screening mammogram will be mailed directly to the patient. RECOMMENDATION: Screening mammogram in one year. (Code:SM-B-01Y) BI-RADS CATEGORY  1: Negative. Electronically Signed   By: Bary Richard M.D.   On: 04/25/2015 08:25       Assessment & Plan:   Problem List Items Addressed This Visit    Abnormal liver function tests     Ultrasound with fatty liver and mild splenomegaly.  Evaluated by GI.   Follow liver function tests.  Has had hepatitis vaccines.        Relevant Orders   CBC with Differential/Platelet   Hepatic function panel   Hemoglobin A1c   Basic metabolic panel   Health care maintenance    Physical today 03/15/16.  PAP 03/14/15 negative with negative HPV.  Mammogram 04/25/15 - Birads I.  Schedule f/u mammogram.       History of colonic polyps    Colonoscopy 08/26/14.  F/u colonoscopy recommended in five years.       Sinusitis    Doing better.  Continue flonase.       Thyroid fullness    Had thyroid ultrasound  07/2013 - normal.  No palpable nodule.  Discussed further evaluation with CT scan and ENT referral.  She declines at this time.  Wants to follow.       Relevant Orders   TSH   T3, free   T4, free    Other Visit Diagnoses    Screening breast examination    -  Primary   Relevant Orders   MM Digital Screening   Skin lesion       has skin tag - left neck.  lesions over shoulder.  refer to dermatology for evaluation.    Screening cholesterol level       Relevant Orders   Lipid panel       Dale [place], MD

## 2016-03-15 NOTE — Assessment & Plan Note (Signed)
Doing better.  Continue flonase.

## 2016-03-15 NOTE — Assessment & Plan Note (Signed)
Colonoscopy 08/26/14.  F/u colonoscopy recommended in five years.

## 2016-03-15 NOTE — Progress Notes (Signed)
Pre-visit discussion using our clinic review tool. No additional management support is needed unless otherwise documented below in the visit note.  

## 2016-03-15 NOTE — Assessment & Plan Note (Signed)
Ultrasound with fatty liver and mild splenomegaly.  Evaluated by GI.   Follow liver function tests.  Has had hepatitis vaccines.

## 2016-03-15 NOTE — Assessment & Plan Note (Signed)
Physical today 03/15/16.  PAP 03/14/15 negative with negative HPV.  Mammogram 04/25/15 - Birads I.  Schedule f/u mammogram.

## 2016-03-15 NOTE — Addendum Note (Signed)
Addended by: Charm BargesSCOTT, Melitta Tigue S on: 03/15/2016 09:13 AM   Modules accepted: Orders

## 2016-03-15 NOTE — Assessment & Plan Note (Signed)
Had thyroid ultrasound 07/2013 - normal.  No palpable nodule.  Discussed further evaluation with CT scan and ENT referral.  She declines at this time.  Wants to follow.

## 2016-03-16 LAB — BASIC METABOLIC PANEL
BUN/Creatinine Ratio: 19 (ref 9–23)
BUN: 13 mg/dL (ref 6–24)
CO2: 25 mmol/L (ref 18–29)
CREATININE: 0.67 mg/dL (ref 0.57–1.00)
Calcium: 9 mg/dL (ref 8.7–10.2)
Chloride: 103 mmol/L (ref 96–106)
GFR calc Af Amer: 120 mL/min/{1.73_m2} (ref >59)
GFR, EST NON AFRICAN AMERICAN: 104 mL/min/{1.73_m2} (ref >59)
Glucose: 93 mg/dL (ref 65–99)
Potassium: 4.5 mmol/L (ref 3.5–5.2)
SODIUM: 141 mmol/L (ref 134–144)

## 2016-03-16 LAB — CBC WITH DIFFERENTIAL/PLATELET
BASOS ABS: 0 10*3/uL (ref 0.0–0.2)
Basos: 1 %
EOS (ABSOLUTE): 0.2 10*3/uL (ref 0.0–0.4)
EOS: 3 %
HEMOGLOBIN: 12.3 g/dL (ref 11.1–15.9)
Hematocrit: 36.8 % (ref 34.0–46.6)
IMMATURE GRANS (ABS): 0 10*3/uL (ref 0.0–0.1)
Immature Granulocytes: 0 %
LYMPHS: 25 %
Lymphocytes Absolute: 1.4 10*3/uL (ref 0.7–3.1)
MCH: 27.3 pg (ref 26.6–33.0)
MCHC: 33.4 g/dL (ref 31.5–35.7)
MCV: 82 fL (ref 79–97)
MONOCYTES: 5 %
Monocytes Absolute: 0.3 10*3/uL (ref 0.1–0.9)
NEUTROS ABS: 3.6 10*3/uL (ref 1.4–7.0)
Neutrophils: 66 %
PLATELETS: 184 10*3/uL (ref 150–379)
RBC: 4.5 x10E6/uL (ref 3.77–5.28)
RDW: 15.1 % (ref 12.3–15.4)
WBC: 5.5 10*3/uL (ref 3.4–10.8)

## 2016-03-16 LAB — HEPATIC FUNCTION PANEL
ALBUMIN: 4.3 g/dL (ref 3.5–5.5)
ALT: 46 IU/L — AB (ref 0–32)
AST: 39 IU/L (ref 0–40)
Alkaline Phosphatase: 123 IU/L — ABNORMAL HIGH (ref 39–117)
BILIRUBIN TOTAL: 0.6 mg/dL (ref 0.0–1.2)
BILIRUBIN, DIRECT: 0.18 mg/dL (ref 0.00–0.40)
Total Protein: 6.8 g/dL (ref 6.0–8.5)

## 2016-03-16 LAB — HEMOGLOBIN A1C
Est. average glucose Bld gHb Est-mCnc: 97 mg/dL
Hgb A1c MFr Bld: 5 % (ref 4.8–5.6)

## 2016-03-16 LAB — LIPID PANEL
CHOLESTEROL TOTAL: 168 mg/dL (ref 100–199)
Chol/HDL Ratio: 3.7 ratio units (ref 0.0–4.4)
HDL: 46 mg/dL (ref >39)
LDL Calculated: 102 mg/dL — ABNORMAL HIGH (ref 0–99)
TRIGLYCERIDES: 102 mg/dL (ref 0–149)
VLDL Cholesterol Cal: 20 mg/dL (ref 5–40)

## 2016-03-16 LAB — TSH: TSH: 2 u[IU]/mL (ref 0.450–4.500)

## 2016-03-16 LAB — T4, FREE: FREE T4: 1.39 ng/dL (ref 0.82–1.77)

## 2016-03-16 LAB — T3, FREE: T3, Free: 3.5 pg/mL (ref 2.0–4.4)

## 2016-03-22 ENCOUNTER — Encounter: Payer: Self-pay | Admitting: *Deleted

## 2016-04-01 NOTE — Telephone Encounter (Signed)
I am unable to tell her how much B12 to take without the B12 level - results.  It is ordered.  If she wants to have drawn, can come in for draw or go to [facility] Also, I am ok with her making her own appt.  She will need to let us know if she needs a referral.

## 2016-04-05 LAB — VITAMIN B12: VITAMIN B 12: 428 pg/mL (ref 211–946)

## 2016-04-06 ENCOUNTER — Encounter: Payer: Self-pay | Admitting: Internal Medicine

## 2016-04-22 ENCOUNTER — Encounter: Payer: Self-pay | Admitting: Internal Medicine

## 2016-04-22 NOTE — Telephone Encounter (Signed)
Patient Name: Katie [name] DOB: [birth year] Initial Comment Caller says, wants an appt, she has a pain on the top Rt side of her head and gets light headed, even when she is sitting still. going on for 10 days or so. Nurse Assessment Nurse: Yetta BarreJones, RN, Miranda Date/Time (Eastern Time): 04/22/2016 1:06:49 PM Confirm and document reason for call. If symptomatic, describe symptoms. You must click the next button to save text entered. ---Caller states she has been having pain in the right top of her head and dizziness. No known injury. Has the patient traveled out of the country within the last 30 days? ---Not Applicable Does the patient have any new or worsening symptoms? ---Yes Will a triage be completed? ---Yes Related visit to physician within the last 2 weeks? ---No Does the PT have any chronic conditions? (i.e. diabetes, asthma, etc.) ---No Is the patient pregnant or possibly pregnant? (Ask all females between the ages of 7712-55) ---No Is this a behavioral health or substance abuse call? ---No Guidelines Guideline Title Affirmed Question Affirmed Notes Headache [1] MODERATE headache (e.g., interferes with normal activities) AND [2] present > 24 hours AND [3] unexplained (Exceptions: analgesics not tried, typical migraine, or headache part of viral illness) Final Disposition User See Physician within 24 Hours Yetta BarreJones, RN, Miranda Comments Appt scheduled for tomorrow at 9:45am with Dr. Antony HasteSonnenburg Referrals REFERRED TO PCP OFFICE Disagree/Comply: Comply

## 2016-04-23 ENCOUNTER — Ambulatory Visit (INDEPENDENT_AMBULATORY_CARE_PROVIDER_SITE_OTHER): Payer: Managed Care, Other (non HMO) | Admitting: Family Medicine

## 2016-04-23 ENCOUNTER — Encounter: Payer: Self-pay | Admitting: Family Medicine

## 2016-04-23 DIAGNOSIS — H8112 Benign paroxysmal vertigo, left ear: Secondary | ICD-10-CM | POA: Insufficient documentation

## 2016-04-23 MED ORDER — MECLIZINE HCL 25 MG PO TABS: 25.0000 mg | ORAL_TABLET | Freq: Three times a day (TID) | ORAL | 0 refills | Status: DC | PRN

## 2016-04-23 NOTE — Progress Notes (Signed)
  Katie AlarEric Katie Broski, MD Phone: [phone]  Katie EyeBernice M [name] is a 50 y.o. female who presents today for same-day visit.  Patient notes for the last 8-10 days she has felt like she is in a fog. She notes no headaches with this. On 2-3 occasions she's had a sharp shooting pain that lasts for several seconds and goes away on its own in the right crown of her head. This is not constant. No numbness or weakness or vision changes. No congestion. No chest pain or shortness of breath. No palpitations. She notes occasional vertigo that can occur while just sitting there though also with changes in position. She denies tinnitus and hearing loss.  ROS see history of present illness  Objective  Physical Exam Vitals:   04/23/16 0940  BP: 118/88  Pulse: 78  Temp: 98 F (36.7 C)    BP Readings from Last 3 Encounters:  04/23/16 118/88  03/15/16 100/70  02/22/16 110/70   Wt Readings from Last 3 Encounters:  04/23/16 225 lb 6 oz (102.2 kg)  03/15/16 225 lb 2 oz (102.1 kg)  02/22/16 226 lb 6.4 oz (102.7 kg)    Physical Exam  Constitutional: She is well-developed, well-nourished, and in no distress.  HENT:  Head: Normocephalic and atraumatic.  Mouth/Throat: Oropharynx is clear and moist. No oropharyngeal exudate.  Normal TMs bilaterally  Eyes: Conjunctivae are normal. Pupils are equal, round, and reactive to light.  Neck: Neck supple.  Cardiovascular: Normal rate, regular rhythm and normal heart sounds.   Pulmonary/Chest: Effort normal and breath sounds normal.  Musculoskeletal: She exhibits no edema.  Neurological: She is alert.  CN 2-12 intact, 5/5 strength in bilateral biceps, triceps, grip, quads, hamstrings, plantar and dorsiflexion, sensation to light touch intact in bilateral UE and LE, normal gait, 2+ patellar reflexes Positive Dix-Hallpike on the left with nystagmus  Skin: Skin is warm and dry.     Assessment/Plan: Please see individual problem list.  BPPV (benign paroxysmal  positional vertigo), left Patient with symptoms of vertigo over the last week or so. Feels as though she is in a fog. No headaches. She is neurologically intact. No upper respiratory symptoms. She does have positive Dix-Hallpike on the left consistent with BPPV. Discussed this diagnosis with her. We will do a modified Epley maneuver at home. Meclizine if she needs it for vertigo. If no improvement this week we will refer to vestibular rehabilitation. Given return precautions.   No orders of the defined types were placed in this encounter.   Meds ordered this encounter  Medications  . meclizine (ANTIVERT) 25 MG tablet    Sig: Take 1 tablet (25 mg total) by mouth 3 (three) times daily as needed for dizziness.    Dispense:  20 tablet    Refill:  0    Katie AlarEric Khaled Herda, MD [facility];

## 2016-04-23 NOTE — Patient Instructions (Signed)
Nice to see you. You likely have BPPV. We will treat you with exercises. You can go pick up the meclizine if you feel it is necessary. If you develop persistent dizziness, numbness, weakness, vision changes, or any new or changing symptoms please seek medical attention.

## 2016-04-23 NOTE — Assessment & Plan Note (Signed)
Patient with symptoms of vertigo over the last week or so. Feels as though she is in a fog. No headaches. She is neurologically intact. No upper respiratory symptoms. She does have positive Dix-Hallpike on the left consistent with BPPV. Discussed this diagnosis with her. We will do a modified Epley maneuver at home. Meclizine if she needs it for vertigo. If no improvement this week we will refer to vestibular rehabilitation. Given return precautions.

## 2016-04-26 ENCOUNTER — Telehealth: Payer: Self-pay | Admitting: Internal Medicine

## 2016-04-26 DIAGNOSIS — H8112 Benign paroxysmal vertigo, left ear: Secondary | ICD-10-CM

## 2016-04-26 NOTE — Telephone Encounter (Signed)
PT called and stated that she came in and saw Dr. Birdie SonsSonnenberg on 10/3 and gave her some exercises for vertigo and to call back if they have not helped. Pt states that they have not helped at all. Please advise, thank you!  Call pt @ [phone] (cell)                 [phone] (work)

## 2016-04-26 NOTE — Telephone Encounter (Signed)
I'll place a referral for vestibular rehabilitation. Thanks.

## 2016-04-26 NOTE — Telephone Encounter (Signed)
Patient advised.

## 2016-04-30 ENCOUNTER — Other Ambulatory Visit: Payer: Self-pay | Admitting: Internal Medicine

## 2016-04-30 ENCOUNTER — Ambulatory Visit
Admission: RE | Admit: 2016-04-30 | Discharge: 2016-04-30 | Disposition: A | Discharge status: Home / Self Care | Payer: Managed Care, Other (non HMO) | Source: Ambulatory Visit | Attending: Internal Medicine | Admitting: Internal Medicine

## 2016-04-30 DIAGNOSIS — Z1231 Encounter for screening mammogram for malignant neoplasm of breast: Secondary | ICD-10-CM | POA: Insufficient documentation

## 2016-04-30 DIAGNOSIS — Z1239 Encounter for other screening for malignant neoplasm of breast: Secondary | ICD-10-CM

## 2016-05-02 ENCOUNTER — Telehealth: Payer: Self-pay | Admitting: *Deleted

## 2016-05-02 NOTE — Telephone Encounter (Signed)
Pt requested a cal to discuss her referral  Please call [phone]

## 2016-05-22 ENCOUNTER — Ambulatory Visit: Payer: Managed Care, Other (non HMO) | Attending: Family Medicine

## 2016-05-22 ENCOUNTER — Encounter: Payer: Self-pay | Admitting: Physical Therapy

## 2016-05-22 VITALS — BP 148/83 | HR 88

## 2016-05-22 DIAGNOSIS — R42 Dizziness and giddiness: Secondary | ICD-10-CM | POA: Diagnosis present

## 2016-05-22 NOTE — Therapy (Signed)
[facility] [address] [place], [place], [address] Phone: [phone]   Fax:  [phone]  Physical Therapy Evaluation  Patient Details  Name: Katie EyeBernice M Bohlin MRN: [id number] Date of Birth: 1966-06-17 Referring Provider: Marikay AlarSonnenberg, Eric  Encounter Date: 05/22/2016      PT End of Session - 05/24/16 0846    Visit Number 1   Number of Visits 7   Date for PT Re-Evaluation 07/03/16   Authorization Type no g codes   PT Start Time 1600   PT Stop Time 1700   PT Time Calculation (min) 60 min   Equipment Utilized During Treatment Gait belt   Activity Tolerance Patient tolerated treatment well   Behavior During Therapy [facility] for tasks assessed/performed      Past Medical History:  Diagnosis Date  . Endometriosis   . Kidney stones   . Migraines   . Ovarian cyst     Past Surgical History:  Procedure Laterality Date  . ABDOMINAL HYSTERECTOMY  2000   left oophorectomy, Dr Mia CreekBerliner  . APPENDECTOMY  2000  . CESAREAN SECTION    . RIGHT OOPHORECTOMY  2002  . VAGINAL BIRTH AFTER CESAREAN SECTION      Vitals:   05/22/16 1558  BP: (!) 148/83  Pulse: 88         Subjective Assessment - 05/24/16 0845    Subjective Dizziness   Pertinent History Pt reports that at the end of August she was sitting still at her desk and had a brief bout of vertigo that lasted a few seconds. She still has intermittent bouts of vertigo 2-3 times/week. States that she also feels like she is in a fog. She gets sharp headaches in the top of her head. This is new since her symptoms started at the end of August. She has a history of migraines but those stopped after her hysterectomy 16 years ago. She reports that she gets floaters with her headaches now. She is getting these headaches only approximately once/month. Denies numbness/tingling. Denies focal weakness. No chest pain, shortness of breath, or palpitations. Reports chills and sweats related to  menopause/perimenopause. Denies unexplained weight gain/loss. Denies recent tick bites. States that she did have a sinus infection a few weeks before her symptoms started and initially thought that this might be related. She has been having a lot of trouble with there sinuses and was thinking she might want to go see the ENT regarding these issues. Denies any recent stressors or increase in anxiety.   Currently in Pain? No/denies            [facility] - 05/24/16 [id number]      Assessment   Medical Diagnosis Dizziness   Referring Provider Marikay AlarSonnenberg, Eric   Onset Date/Surgical Date 03/21/16  Approximate   Hand Dominance Right   Next MD Visit None reported   Prior Therapy None     Precautions   Precautions None     Restrictions   Weight Bearing Restrictions No     Balance Screen   Has the patient fallen in the past 6 months No   Has the patient had a decrease in activity level because of a fear of falling?  No   Is the patient reluctant to leave their home because of a fear of falling?  No     Home Environment   Living Environment Private residence   Living Arrangements Spouse/significant other;Children   Available Help at Discharge Family  Type of Home House   Home Access Stairs to enter   Entrance Stairs-Number of Steps 3   Entrance Stairs-Rails Right;Left   Home Layout One level     Prior Function   Level of Independence Independent   Vocation Full time employment   Vocation Requirements Works as a International aid/development worker. Spends a lot of time in front of a computer screen     Cognition   Overall Cognitive Status Within Functional Limits for tasks assessed     Observation/Other Assessments   Other Surveys  Other Surveys   Activities of Balance Confidence Scale (ABC Scale)  88.125%   Dizziness Handicap Inventory (DHI)  22/100     Standardized Balance Assessment   Standardized Balance Assessment Dynamic Gait Index     Dynamic Gait Index   Level Surface Normal    Change in Gait Speed Normal   Gait with Horizontal Head Turns Normal   Gait with Vertical Head Turns Normal   Gait and Pivot Turn Normal   Step Over Obstacle Normal   Step Around Obstacles Normal   Steps Normal   Total Score 24      VESTIBULAR AND BALANCE EVALUATION  Onset Date:   HISTORY:  Subjective history of current problem: Pt reports that at the end of August she was sitting still at her desk and had a brief bout of vertigo that lasted a few seconds. She still has intermittent bouts of vertigo 2-3 times/week. States that she also feels like she is in a fog. She gets sharp headaches in the top of her head. This is new since her symptoms started at the end of August. She has a history of migraines but those stopped after her hysterectomy 16 years ago. She reports that she gets floaters with her headaches now. She is getting these headaches only approximately once/month. Denies numbness/tingling. Denies focal weakness. No chest pain, shortness of breath, or palpitations. Reports chills and sweats related to menopause/perimenopause. Denies unexplained weight gain/loss. Denies recent tick bites. States that she did have a sinus infection a few weeks before her symptoms started and initially thought that this might be related. She has been having a lot of trouble with there sinuses and was thinking she might want to go see the ENT regarding these issues. Denies any recent stressors or increase in anxiety.   Description of dizziness: (vertigo, unsteadiness, lightheadedness, falling, general unsteadiness, aural fullness): Vertigo, swaying Frequency: vertigo 2-3 times/week, swaying: 2-3 times/week  Duration: vertigo 2-3 minutes, swaying: hours Symptom nature: (motion provoked/positional/spontaneous/constant, variable, intermittent) spontaneous, does not feel like symptoms are necessarily motion provoked  Provocative Factors: sinus pressure otherwise unknown Easing Factors: stay still for  vertigo to pass, swaying/fog: unknown  Progression of symptoms: (better, worse, no change since onset): no change History of similar episodes: None  Falls (yes/no): No Number of falls in past 6 months: No   Prior Functional Level: Independent and working full time  Auditory complaints (tinnitus, pain, drainage): tinnitis R side (new since onset), bilateral ear pain. Denies aural fullness but reports intermittent popping, denies hearing loss, denies discharge Vision (last [name] exam, diplopia, recent changes): Switched to bifocals earlier this year but had to switch back because she "couldn't tolerate them." Denies diplopia  Current Symptoms: vertigo, swaying, headaches; Denies: N & V, dysarthria, dysphagia, drop attacks, bowel and bladder changes, recent weight loss/gain, lightheadedness, migraines) Review of systems negative for red flags.   EXAMINATION  POSTURE: Grossly WNL  NEUROLOGICAL SCREEN: (2+ unless otherwise noted.) N=normal  Ab=abnormal  Level Dermatome R L Myotome R L Reflex R L  C3 Anterior Neck N N Sidebend C2-3 N N Jaw CN V    C4 Top of Shoulder N N Shoulder Shrug C4 N N Hoffman's UMN    C5 Lateral Upper Arm N N Shoulder ABD C4-5 N N Biceps C5-6    C6 Lateral Arm/ Thumb N N Arm Flex/ Wrist Ext C5-6 N N Brachiorad. C5-6    C7 Middle Finger N N Arm Ext//Wrist Flex C6-7 N N Triceps C7    C8 4th & 5th Finger N N Flex/ Ext Carpi Ulnaris C8 N N Patella    T1 Medial Arm N N Interossei T1 N N Gastrocnemius    L2 Medial thigh/groin N N Illiopsoas (L2-3) N N     L3 Lower thigh/med.knee N N Quadriceps (L3-4) N N Patellar (L3-4)    L4 Medial leg/lat thigh N N Tibialis Ant (L4-5) N N     L5 Lat. leg & dorsal foot N N EHL (L5) N N     S1 post/lat foot/thigh/leg N N Gastrocnemius (S1-2) N N Gastrocnemius (S1)    S2 Post./med. thigh & leg N N Hamstrings (L4-S3) N N Babinski     Reflex testing deferred  SOMATOSENSORY:         Sensation           Intact      Diminished          Absent  Light touch Normal    Any N & T in extremities or weakness: Denies      COORDINATION: Finger to Nose: Normal Pronator Drift: Normal Rapid alternating movements: Normal Finger opposition: Normal   MUSCULOSKELETAL SCREEN: Cervical Spine ROM: Grossly WNL, painless   ROM: WNL  MMT: WNL, no focal weakness   Functional Mobility: Independent with transfers without UE support  Gait: Scanning of visual environment with gait is: Good  Balance: No gross abnormalities noted   POSTURAL CONTROL TESTS:   Clinical Test of Sensory Interaction for Balance    (CTSIB):  CONDITION TIME STRATEGY SWAY  Eyes open, firm surface 30s  1+  Eyes closed, firm surface 30s  1+  Eyes open, foam surface 30s  2+  Eyes closed, foam surface 3s  4+    OCULOMOTOR / VESTIBULAR TESTING:  Oculomotor Exam- Room Light  Normal Abnormal Comments  Ocular Alignment N    Ocular ROM N    Spontaneous Nystagmus N    End-Gaze Nystagmus N    Smooth Pursuit N    Saccades N    VOR  A   VOR Cancellation N    Left Head Thrust  A Positive 2/3 trials, dizziness also reported  Right Head Thrust N    Head [id number] Nystagmus     Static Acuity     Dynamic Acuity       Oculomotor Exam- Fixation Suppressed  Normal Abnormal Comments  Ocular Alignment N    Ocular ROM N    Spontaneous Nystagmus N    End-Gaze Nystagmus N    Left Head Thrust     Right Head Thrust22     Head Shaking Nystagmus  A A few R horizontal beats of nystagmus?    BPPV TESTS:  Symptoms Duration Intensity Nystagmus  L Dix-Hallpike 2/10 "wobbly," no true vertigo   None  R Dix-Hallpike 6/10 "wobbly," no true vertigo   None  L Head Roll 2/10 "wobbly" no vertigo   None  R Head Roll  3/10 "wobbly" no vertigo   None  L Sidelying Test      R Sidelying Test        FUNCTIONAL OUTCOME MEASURES:  Results Comments  DHI 22/100 Minimal impairment  ABC Scale 88.125% Above cut-off, WNL  DGI 24/24 WNL  10 meter Walking Speed >1.2 m/s WNL      Dynamic Visual Acuity: 20/13 to 20/25 (3 line loss);     TREATMENT  Pt issued VOR x 1 horizontal and seated ball passes between hands with head/[name] follow. Performed both exercises with patient. She can only tolerate approximately 30 seconds of VORx1 so issued this for HEP. Pt demonstrates good technique. Handout provided to patient with instructions for both exercises and extensive education provided.                           PT Education - 05/24/16 0846    Education provided Yes   Education Details HEP, plan of care, possible need for referral if symptoms don't improve   Person(s) Educated Patient   Methods Explanation;Demonstration;Verbal cues;Handout   Comprehension Verbalized understanding;Returned demonstration             PT Long Term Goals - 05/24/16 0856      PT LONG TERM GOAL #1   Title Pt will be independent with HEP in order to improve strength and balance in order to decrease fall risk and improve function at home and work.    Time 6   Period Weeks   Status New     PT LONG TERM GOAL #2   Title Pt will decrease DHI score by at least 18 points in order to demonstrate clinically significant reduction in disability    Baseline 05/22/16: 22/100   Time 6   Period Weeks   Status New     PT LONG TERM GOAL #3   Title Pt will be able to perform eyes closed, feet together balance on compliant surface for >30 seconds to demonstrates decreased risk for falling in low light conditions   Baseline 05/22/16: approximately 3 seconds   Time 6   Period Weeks   Status New     PT LONG TERM GOAL #4   Title Pt will be able to continue working at computer while turning her head between 2 monitors without brining on her symptoms   Time 6   Period Weeks   Status New               Plan - 05/24/16 0847    Clinical Impression Statement Pt is a pleasant 50 yo female referred to vestibular therapy for dizziness and possible BPPV. PT evaluation  reveals negative BPPV testing on this date including Dix-Hallpike and lateral canal tests. Pt with positive VOR as well as positive L head thrust on this date. She also presents with positive post-headshake nystagmus with R horizontal beats. She loses her balance on compliant surface with eyes closed durign mCTSIB and she has a 3 line loss with dynamic visual acuity testing. 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 All additional testing is negative including 24/24 on DGI and 88.125% on ABC. DHI of 22/100 indicates minimal impairment from symptoms. Pt does report some "foggy" and "swaying" sensations that last for multiple hours at a time intermittently throughout the week and come on spontaneously. She complains of new onset headaches with visual disturbances since dizziness symptoms started. Denies migraines but does have a prior history  of migraines before her hysterectomy. Some of her symptoms could be a variant of migraine associated vertigo. If patient does not respond to vestibular therapy and symptoms persist she may warrant a referral to ENT or neurology for further investigation. Pt will benefit from skilled PT services to address prior listed deficits in order to decrease her symtpoms and return to full function at home and work.    Rehab Potential Excellent   Clinical Impairments Affecting Rehab Potential Positive: peripheral findings, motivation; Negative: spontaneous onset of multiple horu duration   PT Frequency 1x / week   PT Duration 6 weeks   PT Treatment/Interventions Biofeedback;Canalith Repostioning;Cryotherapy;Electrical Stimulation;Iontophoresis 4mg /ml Dexamethasone;Moist Heat;Traction;Ultrasound;Gait training;Therapeutic exercise;Balance training;Neuromuscular re-education;Patient/family education;Manual techniques;Vestibular   PT Next Visit Plan Progress VOR, semitandem progressions, eyes closed foam, head turning activities   PT Home Exercise Plan VOR x 1  horizontal in sitting, seated ball passes between hands with head/[name] follow   Consulted and Agree with Plan of Care Patient      Patient will benefit from skilled therapeutic intervention in order to improve the following deficits and impairments:  Dizziness  Visit Diagnosis: Dizziness and giddiness - Plan: PT plan of care cert/re-cert     Problem List Patient Active Problem List   Diagnosis Date Noted  . BPPV (benign paroxysmal positional vertigo), left 04/23/2016  . Chest pressure 07/02/2015  . Health care maintenance 03/19/2015  . GERD (gastroesophageal reflux disease) 02/12/2015  . History of colonic polyps 02/12/2015  . Urinary frequency 01/29/2015  . Abdominal pain, epigastric 06/23/2014  . Pain in the chest 06/23/2014  . Sinusitis, acute 05/16/2014  . Abnormal liver function tests 03/06/2014  . Thyroid fullness 03/06/2014  . Menopausal symptoms 03/04/2013  . Edema of both legs 02/03/2013  . Sinusitis 11/02/2012  . Family history of colonic polyps 08/27/2012  . Migraine headache 08/27/2012  . Nephrolithiasis 08/27/2012  . Endometriosis 08/27/2012   Lynnea Maizes PT, DPT   Huprich,Jason 05/24/2016, 9:04 AM  [facility] MAIN [facility] SERVICES [address] [place], [place], [address] Phone: [phone]   Fax:  [phone]  Name: ASUSENA SIGLEY MRN: [id number] Date of Birth: 1966-06-08

## 2016-05-28 ENCOUNTER — Ambulatory Visit: Payer: Managed Care, Other (non HMO)

## 2016-05-28 DIAGNOSIS — R42 Dizziness and giddiness: Secondary | ICD-10-CM | POA: Diagnosis not present

## 2016-05-28 NOTE — Patient Instructions (Signed)
Feet Heel-Toe "Tandem", Head Motion - Eyes Open    With eyes open, right foot directly in front of the other and slightly out to the side, move head slowly: left and right. Perform for 30 seconds-1 minute as tolerate. Repeat rotating from waist and turning had and body. Repeat _3___ times per session each on both sides. Do _2___ sessions per day.

## 2016-05-28 NOTE — Therapy (Signed)
[facility] [address] , [place], [address] Phone: [phone]   Fax:  [phone]  Physical Therapy Treatment  Patient Details  Name: Katie EyeBernice M Hollibaugh MRN: [id number] Date of Birth: Jun 30, 1966 Referring Provider: Marikay AlarSonnenberg, Eric  Encounter Date: 05/28/2016      PT End of Session - 05/28/16 1308    Visit Number 2   Number of Visits 7   Date for PT Re-Evaluation 07/03/16   Authorization Type no g codes   PT Start Time 1305   PT Stop Time 1345   PT Time Calculation (min) 40 min   Equipment Utilized During Treatment Gait belt   Activity Tolerance Patient tolerated treatment well   Behavior During Therapy [facility] for tasks assessed/performed      Past Medical History:  Diagnosis Date  . Endometriosis   . Kidney stones   . Migraines   . Ovarian cyst     Past Surgical History:  Procedure Laterality Date  . ABDOMINAL HYSTERECTOMY  2000   left oophorectomy, Dr Mia CreekBerliner  . APPENDECTOMY  2000  . CESAREAN SECTION    . RIGHT OOPHORECTOMY  2002  . VAGINAL BIRTH AFTER CESAREAN SECTION      There were no vitals filed for this visit.      Subjective Assessment - 05/28/16 1307    Subjective Pt reports that the episodes of spinning have improved since the initial evaluation. She has performed HEP 2x/day. No specific questions or concerns currently.    Pertinent History Pt reports that at the end of August she was sitting still at her desk and had a brief bout of vertigo that lasted a few seconds. She still has intermittent bouts of vertigo 2-3 times/week. States that she also feels like she is in a fog. She gets sharp headaches in the top of her head. This is new since her symptoms started at the end of August. She has a history of migraines but those stopped after her hysterectomy 16 years ago. She reports that she gets floaters with her headaches now. She is getting these headaches only approximately once/month. Denies  numbness/tingling. Denies focal weakness. No chest pain, shortness of breath, or palpitations. Reports chills and sweats related to menopause/perimenopause. Denies unexplained weight gain/loss. Denies recent tick bites. States that she did have a sinus infection a few weeks before her symptoms started and initially thought that this might be related. She has been having a lot of trouble with there sinuses and was thinking she might want to go see the ENT regarding these issues. Denies any recent stressors or increase in anxiety.   Currently in Pain? No/denies         TREATMENT  VOR VOR x 1 horizontal in sitting 60 seconds x 2 (4/10); VOR x 1 horizontal in standing 45 seconds (cannot tolerate 1 minute) x 2;  Seated Ball Pass Seated ball pass with horizontal head/[name] follow 45 seconds (cannot tolerate 1 minute) x 2;  Semitandem Semitandem balance with horizontal head turns x 30 seconds; Semitandem balance with horizontal head and body turns x 30 seconds;  Hallway Ambulation Forward ambulation in hallway with horizontal ball pass between hands with head/[name] follow x 75' Forward ambulation with vertical ball toss to self x 75';  Pt issued written HEP progression to perform at home. Pt requires rest breaks between activities due to increase in dizziness.  PT Education - 05/28/16 1308    Education provided Yes   Education Details HEP progression   Person(s) Educated Patient   Methods Explanation;Demonstration;Verbal cues;Handout   Comprehension Verbalized understanding             PT Long Term Goals - 05/24/16 0856      PT LONG TERM GOAL #1   Title Pt will be independent with HEP in order to improve strength and balance in order to decrease fall risk and improve function at home and work.    Time 6   Period Weeks   Status New     PT LONG TERM GOAL #2   Title Pt will decrease DHI score by at least 18 points in order to demonstrate  clinically significant reduction in disability    Baseline 05/22/16: 22/100   Time 6   Period Weeks   Status New     PT LONG TERM GOAL #3   Title Pt will be able to perform eyes closed, feet together balance on compliant surface for >30 seconds to demonstrates decreased risk for falling in low light conditions   Baseline 05/22/16: approximately 3 seconds   Time 6   Period Weeks   Status New     PT LONG TERM GOAL #4   Title Pt will be able to continue working at computer while turning her head between 2 monitors without brining on her symptoms   Time 6   Period Weeks   Status New               Plan - 05/28/16 1308    Clinical Impression Statement Pt reports improving symptoms since the initial evaluation. She was not performing ball passes correctly and when performed correctly during treatment session she reports increase in her dizziness. Pt reports dizziness with all head turning activities during session. Pt provided progression of HEP and encouraged to follow-up as scheduled.    Rehab Potential Excellent   Clinical Impairments Affecting Rehab Potential Positive: peripheral findings, motivation; Negative: spontaneous onset of multiple horu duration   PT Frequency 1x / week   PT Duration 6 weeks   PT Treatment/Interventions Biofeedback;Canalith Repostioning;Cryotherapy;Electrical Stimulation;Iontophoresis 4mg /ml Dexamethasone;Moist Heat;Traction;Ultrasound;Gait training;Therapeutic exercise;Balance training;Neuromuscular re-education;Patient/family education;Manual techniques;Vestibular   PT Next Visit Plan Progress VOR, semitandem progressions, eyes closed foam, head turning activities   PT Home Exercise Plan VOR x 1 horizontal in standing with feet together, semi-tandem stance with horizontal head/body turns;   Consulted and Agree with Plan of Care Patient      Patient will benefit from skilled therapeutic intervention in order to improve the following deficits and  impairments:  Dizziness  Visit Diagnosis: Dizziness and giddiness     Problem List Patient Active Problem List   Diagnosis Date Noted  . BPPV (benign paroxysmal positional vertigo), left 04/23/2016  . Chest pressure 07/02/2015  . Health care maintenance 03/19/2015  . GERD (gastroesophageal reflux disease) 02/12/2015  . History of colonic polyps 02/12/2015  . Urinary frequency 01/29/2015  . Abdominal pain, epigastric 06/23/2014  . Pain in the chest 06/23/2014  . Sinusitis, acute 05/16/2014  . Abnormal liver function tests 03/06/2014  . Thyroid fullness 03/06/2014  . Menopausal symptoms 03/04/2013  . Edema of both legs 02/03/2013  . Sinusitis 11/02/2012  . Family history of colonic polyps 08/27/2012  . Migraine headache 08/27/2012  . Nephrolithiasis 08/27/2012  . Endometriosis 08/27/2012   Lynnea MaizesJason D Huprich PT, DPT   Huprich,Jason 05/28/2016, 5:02 PM  [facility]  MAIN [facility] SERVICES [address] [place], [place], [address] Phone: [phone]   Fax:  [phone]  Name: ZARAH CARBON MRN: [id number] Date of Birth: [birth year]

## 2016-05-29 ENCOUNTER — Ambulatory Visit (INDEPENDENT_AMBULATORY_CARE_PROVIDER_SITE_OTHER): Payer: Managed Care, Other (non HMO)

## 2016-05-29 DIAGNOSIS — Z23 Encounter for immunization: Secondary | ICD-10-CM

## 2016-06-04 ENCOUNTER — Ambulatory Visit: Payer: Managed Care, Other (non HMO)

## 2016-06-04 DIAGNOSIS — R42 Dizziness and giddiness: Secondary | ICD-10-CM | POA: Diagnosis not present

## 2016-06-04 NOTE — Therapy (Signed)
[facility] MAIN [facility] SERVICES [address] [place], [place], [address] Phone: [phone]   Fax:  [phone]  Physical Therapy Treatment  Patient Details  Name: QUINLYNN CUTHBERT MRN: [id number] Date of Birth: 1965-10-20 Referring Provider: Marikay Alar  Encounter Date: 06/04/2016      PT End of Session - 06/04/16 1648    Visit Number 3   Number of Visits 7   Date for PT Re-Evaluation 07/03/16   Authorization Type no g codes   PT Start Time 1640   PT Stop Time 1725   PT Time Calculation (min) 45 min   Equipment Utilized During Treatment Gait belt   Activity Tolerance Patient tolerated treatment well   Behavior During Therapy [facility] for tasks assessed/performed      Past Medical History:  Diagnosis Date  . Endometriosis   . Kidney stones   . Migraines   . Ovarian cyst     Past Surgical History:  Procedure Laterality Date  . ABDOMINAL HYSTERECTOMY  2000   left oophorectomy, Dr Mia Creek  . APPENDECTOMY  2000  . CESAREAN SECTION    . RIGHT OOPHORECTOMY  2002  . VAGINAL BIRTH AFTER CESAREAN SECTION      There were no vitals filed for this visit.      Subjective Assessment - 06/04/16 1647    Subjective Pt reports no further episodes of vertigo at this time. She states that she is 80-85% improved since initial evaluatin. She is performing HEP without issue. No specific questions or concerns currently.    Pertinent History Pt reports that at the end of August she was sitting still at her desk and had a brief bout of vertigo that lasted a few seconds. She still has intermittent bouts of vertigo 2-3 times/week. States that she also feels like she is in a fog. She gets sharp headaches in the top of her head. This is new since her symptoms started at the end of August. She has a history of migraines but those stopped after her hysterectomy 16 years ago. She reports that she gets floaters with her headaches now. She is getting these  headaches only approximately once/month. Denies numbness/tingling. Denies focal weakness. No chest pain, shortness of breath, or palpitations. Reports chills and sweats related to menopause/perimenopause. Denies unexplained weight gain/loss. Denies recent tick bites. States that she did have a sinus infection a few weeks before her symptoms started and initially thought that this might be related. She has been having a lot of trouble with there sinuses and was thinking she might want to go see the ENT regarding these issues. Denies any recent stressors or increase in anxiety.   Currently in Pain? No/denies        Progress VOR, semitandem progressions, eyes closed foam, head turning activities   TREATMENT  VOR VOR x 1 horizontal in standing feet together with conflicting background 60 seconds x 2 (4/10); VOR x 1 horizontal with forward/retro ambulation 35', pt stopping very 10' due to increase in dizzines (4/10 with forward, 5/10 with retro), discontinued as this appears to be a little too aggressive; VOR x 2 horizontal in standing with feet together and conflicting background 60 seconds x 2 (added to HEP), pt requires extensive cues and hand over hand assist to perform correctly. Pt constantly tends to get back in phase and requires cues for correction;  Tandem Tandem balance with horizontal and vertical head turns alternating LE forward x 30 seconds each; Tandem balance with horizontal  head and body turns x 30 seconds; Pt able to perform semitandem without balance disruption thus the progression to full tandem stance  Stairs Stair ascend/descend with cone tapping, cone tapping with cognitive task (serial alphabetical names), and ball pass between hands with head/eye follow; Pt denies dizziness except mild during descend with horizontal ball pass and head/eye follow;  Hallway Ambulation Forward ambulation in hallway with horizontal ball toss with therapist with head/eye follow x 75'  multiple bouts; Forward ambulation with vertical ball toss to self x 75' multiple bouts;  Pt provided progression of HEP on this date. Written handout with extensive education provided.                       PT Education - 06/04/16 1648    Education provided Yes   Education Details HEP progression   Person(s) Educated Patient   Methods Explanation   Comprehension Verbalized understanding             PT Long Term Goals - 05/24/16 0856      PT LONG TERM GOAL #1   Title Pt will be independent with HEP in order to improve strength and balance in order to decrease fall risk and improve function at home and work.    Time 6   Period Weeks   Status New     PT LONG TERM GOAL #2   Title Pt will decrease DHI score by at least 18 points in order to demonstrate clinically significant reduction in disability    Baseline 05/22/16: 22/100   Time 6   Period Weeks   Status New     PT LONG TERM GOAL #3   Title Pt will be able to perform eyes closed, feet together balance on compliant surface for >30 seconds to demonstrates decreased risk for falling in low light conditions   Baseline 05/22/16: approximately 3 seconds   Time 6   Period Weeks   Status New     PT LONG TERM GOAL #4   Title Pt will be able to continue working at computer while turning her head between 2 monitors without brining on her symptoms   Time 6   Period Weeks   Status New               Plan - 06/04/16 1648    Clinical Impression Statement Patient continues to report dizziness with head turning activities during ambulation. Attempted VOR x 1 horizontal with forward/retro ambulation but proves to be too aggressive at this time as pt has to stop every 10-15' due to increased dizziness. Attempted stairs with patient since she reports this frequently aggravates her symptoms however she is able to perform well today with only minimal increase in dizziness while performing ball passess during  descent. Progression of HEP provided on this date and pt encouraged to follow-up as scheduled.    Rehab Potential Excellent   Clinical Impairments Affecting Rehab Potential Positive: peripheral findings, motivation; Negative: spontaneous onset of multiple horu duration   PT Frequency 1x / week   PT Duration 6 weeks   PT Treatment/Interventions Biofeedback;Canalith Repostioning;Cryotherapy;Electrical Stimulation;Iontophoresis 4mg /ml Dexamethasone;Moist Heat;Traction;Ultrasound;Gait training;Therapeutic exercise;Balance training;Neuromuscular re-education;Patient/family education;Manual techniques;Vestibular   PT Next Visit Plan Progress VOR, iandem progressions with head/body turns, hallway ambulation with head turns/ball passess, quick turns in hallway   PT Home Exercise Plan VOR x 1 horizontal in standing with feet together with conflicting background, VOR x 2 horizontal in sitting, tandem stance with horizontal head/body turns;  Consulted and Agree with Plan of Care Patient      Patient will benefit from skilled therapeutic intervention in order to improve the following deficits and impairments:  Dizziness  Visit Diagnosis: Dizziness and giddiness     Problem List Patient Active Problem List   Diagnosis Date Noted  . BPPV (benign paroxysmal positional vertigo), left 04/23/2016  . Chest pressure 07/02/2015  . Health care maintenance 03/19/2015  . GERD (gastroesophageal reflux disease) 02/12/2015  . History of colonic polyps 02/12/2015  . Urinary frequency 01/29/2015  . Abdominal pain, epigastric 06/23/2014  . Pain in the chest 06/23/2014  . Sinusitis, acute 05/16/2014  . Abnormal liver function tests 03/06/2014  . Thyroid fullness 03/06/2014  . Menopausal symptoms 03/04/2013  . Edema of both legs 02/03/2013  . Sinusitis 11/02/2012  . Family history of colonic polyps 08/27/2012  . Migraine headache 08/27/2012  . Nephrolithiasis 08/27/2012  . Endometriosis 08/27/2012   Lynnea MaizesJason  D Huprich PT, DPT   Huprich,Jason 06/05/2016, 8:25 AM  [facility] [address] [place], [place], [address] Phone: [phone]   Fax:  [phone]  Name: Reggy EyeBernice M Vandeventer MRN: [id number] Date of Birth: 1966-06-06

## 2016-06-10 ENCOUNTER — Encounter: Payer: Self-pay | Admitting: Physical Therapy

## 2016-06-10 ENCOUNTER — Ambulatory Visit: Payer: Managed Care, Other (non HMO) | Admitting: Physical Therapy

## 2016-06-10 DIAGNOSIS — R42 Dizziness and giddiness: Secondary | ICD-10-CM

## 2016-06-10 NOTE — Therapy (Signed)
[facility] [address] [place], [place], [address] Phone: [phone]   Fax:  [phone]  Physical Therapy Treatment  Patient Details  Name: Katie [name] MRN: [id number] Date of Birth: 1966-04-11 Referring Provider: Marikay AlarSonnenberg, Eric  Encounter Date: 06/10/2016      PT End of Session - 06/10/16 1538    Visit Number 4   Number of Visits 7   Date for PT Re-Evaluation 07/03/16   Authorization Type no g codes   PT Start Time 0333   PT Stop Time 0413   PT Time Calculation (min) 40 min   Equipment Utilized During Treatment Gait belt   Activity Tolerance Patient tolerated treatment well   Behavior [facility] Therapy [facility] for tasks assessed/performed      Past Medical History:  Diagnosis Date  . Endometriosis   . Kidney stones   . Migraines   . Ovarian cyst     Past Surgical History:  Procedure Laterality Date  . ABDOMINAL HYSTERECTOMY  2000   left oophorectomy, Dr Mia CreekBerliner  . APPENDECTOMY  2000  . CESAREAN SECTION    . RIGHT OOPHORECTOMY  2002  . VAGINAL BIRTH AFTER CESAREAN SECTION      There were no vitals filed for this visit.      Subjective Assessment - 06/10/16 1536    Subjective Patient reports that she had vertigo on Saturday which she states she had not been having for some time. Patient states she feels like she might be getting a sinus infection and she made an appointment with an ENT physician.    Pertinent History Pt reports that at the end of August she was sitting still at her desk and had a brief bout of vertigo that lasted a few seconds. She still has intermittent bouts of vertigo 2-3 times/week. States that she also feels like she is in a fog. She gets sharp headaches in the top of her head. This is new since her symptoms started at the end of August. She has a history of migraines but those stopped after her hysterectomy 16 years ago. She reports that she gets floaters with her headaches now.  She is getting these headaches only approximately once/month. Denies numbness/tingling. Denies focal weakness. No chest pain, shortness of breath, or palpitations. Reports chills and sweats related to menopause/perimenopause. Denies unexplained weight gain/loss. Denies recent tick bites. States that she did have a sinus infection a few weeks before her symptoms started and initially thought that this might be related. She has been having a lot of trouble with there sinuses and was thinking she might want to go see the ENT regarding these issues. Denies any recent stressors or increase in anxiety.      Neuromuscular Re-education: Newman PiesBall toss over shoulder: Patient performed multiple 175' trials of forward and retro ambulation while tossing ball over one shoulder with return catch over opposite shoulder with CGA. Patient reports increase in dizziness with this activity. Patient performed multiple 175' trials of forward and retro ambulation while tossing ball over one shoulder with return catch over opposite shoulder varying the ball position to head, shoulder and waist level with CGA.   VOR x 1 exercise: Patient performed VOR X 1 horiz in standing 3 reps of 1 minute with conflicting background.    Airex balance beam: On Airex balance beam, performed sideways stance static holds with horizontal and vertical head turns.  On Airex balance beam, performed sideways stepping with horizontal head turns 5' times  4 reps. On Airex balance beam, performed sideways stepping with vertical head turns 5' times 4 reps.  Newman Pies toss over shoulder: Patient performed multiple 175' trials of forward and retro ambulation while tossing ball over one shoulder with return catch over opposite shoulder with CGA.  Patient performed multiple 175' trials of forward and retro ambulation while tossing ball over one shoulder with return catch over opposite shoulder varying the ball position to head, shoulder and waist level to  promote head turning and tilting. Patient reports increase in dizziness with this activity.  Body Wall Rolls: Patient performed 2 reps of supported, body wall rolls with EO. Patient reports dizziness with this activity. Issued for HEP.          PT Education - 06/10/16 1714    Education provided Yes   Education Details Discussed HEP and added body wall rolls   Person(s) Educated Patient   Methods Explanation;Demonstration;Handout   Comprehension Verbalized understanding;Returned demonstration             PT Long Term Goals - 05/24/16 0856      PT LONG TERM GOAL #1   Title Pt will be independent with HEP in order to improve strength and balance in order to decrease fall risk and improve function at home and work.    Time 6   Period Weeks   Status New     PT LONG TERM GOAL #2   Title Pt will decrease DHI score by at least 18 points in order to demonstrate clinically significant reduction in disability    Baseline 05/22/16: 22/100   Time 6   Period Weeks   Status New     PT LONG TERM GOAL #3   Title Pt will be able to perform eyes closed, feet together balance on compliant surface for >30 seconds to demonstrates decreased risk for falling in low light conditions   Baseline 05/22/16: approximately 3 seconds   Time 6   Period Weeks   Status New     PT LONG TERM GOAL #4   Title Pt will be able to continue working at computer while turning her head between 2 monitors without brining on her symptoms   Time 6   Period Weeks   Status New               Plan - 06/10/16 1538    Clinical Impression Statement Patient reports increase in dizziness symptoms with activities that involve head turns and body turns during therapy session this date. Body wall roll exercise added to home exercise program. Patient reporting mild episode of vertigo over the weekend. Patient would continue to benefit from PT services to further address goals and symptoms.     Rehab Potential  Excellent   Clinical Impairments Affecting Rehab Potential Positive: peripheral findings, motivation; Negative: spontaneous onset of multiple horu duration   PT Frequency 1x / week   PT Duration 6 weeks   PT Treatment/Interventions Biofeedback;Canalith Repostioning;Cryotherapy;Electrical Stimulation;Iontophoresis 4mg /ml Dexamethasone;Moist Heat;Traction;Ultrasound;Gait training;Therapeutic exercise;Balance training;Neuromuscular re-education;Patient/family education;Manual techniques;Vestibular   PT Next Visit Plan Review VOR with conflicting background, tandem progressions with head/body turns, hallway ambulation with ball toss over shoulder, quick turns in hallway   PT Home Exercise Plan VOR x 1 horizontal in standing with feet together with conflicting background, VOR x 2 horizontal in sitting, tandem stance with horizontal head/body turns; body wall rolls   Consulted and Agree with Plan of Care Patient      Patient will benefit from skilled therapeutic intervention  in order to improve the following deficits and impairments:  Dizziness  Visit Diagnosis: Dizziness and giddiness     Problem List Patient Active Problem List   Diagnosis Date Noted  . BPPV (benign paroxysmal positional vertigo), left 04/23/2016  . Chest pressure 07/02/2015  . Health care maintenance 03/19/2015  . GERD (gastroesophageal reflux disease) 02/12/2015  . History of colonic polyps 02/12/2015  . Urinary frequency 01/29/2015  . Abdominal pain, epigastric 06/23/2014  . Pain in the chest 06/23/2014  . Sinusitis, acute 05/16/2014  . Abnormal liver function tests 03/06/2014  . Thyroid fullness 03/06/2014  . Menopausal symptoms 03/04/2013  . Edema of both legs 02/03/2013  . Sinusitis 11/02/2012  . Family history of colonic polyps 08/27/2012  . Migraine headache 08/27/2012  . Nephrolithiasis 08/27/2012  . Endometriosis 08/27/2012   Mardelle Matteorriea Micharl Helmes PT, DPT Mardelle MatteMurphy,Savannaha Stonerock 06/11/2016, 3:11 PM  [facility] [address] [place], [place], [address] Phone: [phone]   Fax:  [phone]  Name: Katie [name] MRN: [id number] Date of Birth: [birth year]

## 2016-06-19 ENCOUNTER — Ambulatory Visit: Payer: Managed Care, Other (non HMO)

## 2016-06-19 VITALS — BP 148/71 | HR 93

## 2016-06-19 DIAGNOSIS — R42 Dizziness and giddiness: Secondary | ICD-10-CM | POA: Diagnosis not present

## 2016-06-19 NOTE — Therapy (Signed)
[place] [facility] MAIN [facility] SERVICES [address] [place], [place], [address] Phone: [phone]   Fax:  [phone]  Physical Therapy Treatment  Patient Details  Name: Katie [name] MRN: [id number] Date of Birth: 1966-02-16 Referring Provider: Marikay [name]  Encounter Date: 06/19/2016      [name] End of Session - 06/19/16 1527    Visit Number 5   Number of Visits 7   Date for [name] Re-Evaluation 07/03/16   Authorization Type no g codes   [name] Start Time 1530   [name] Stop Time 1615   [name] Time Calculation (min) 45 min   Equipment Utilized During Treatment Gait belt   Activity Tolerance Patient tolerated treatment well   Behavior During Therapy [facility] for tasks assessed/performed      Past Medical History:  Diagnosis Date  . Endometriosis   . Kidney stones   . Migraines   . Ovarian cyst     Past Surgical History:  Procedure Laterality Date  . ABDOMINAL HYSTERECTOMY  2000   left oophorectomy, Dr Katie [name]  . APPENDECTOMY  2000  . CESAREAN SECTION    . RIGHT OOPHORECTOMY  2002  . VAGINAL BIRTH AFTER CESAREAN SECTION      Vitals:   06/19/16 1532  BP: (!) 148/71  Pulse: 93  SpO2: 98%        Subjective Assessment - 06/19/16 1526    Subjective [name] states she is doing well on this date. No further bouts of vertigo since last therapy session. Overall she reports she is approximately 70% improved since starting therapy. HEP is going well at home and [name] is performing consistently. No specific questions or concerns.    Pertinent History [name] reports that at the end of August she was sitting still at her desk and had a brief bout of vertigo that lasted a few seconds. She still has intermittent bouts of vertigo 2-3 times/week. States that she also feels like she is in a fog. She gets sharp headaches in the top of her head. This is new since her symptoms started at the end of August. She has a history of migraines but those stopped after her hysterectomy 16  years ago. She reports that she gets floaters with her headaches now. She is getting these headaches only approximately once/month. Denies numbness/tingling. Denies focal weakness. No chest pain, shortness of breath, or palpitations. Reports chills and sweats related to menopause/perimenopause. Denies unexplained weight gain/loss. Denies recent tick bites. States that she did have a sinus infection a few weeks before her symptoms started and initially thought that this might be related. She has been having a lot of trouble with there sinuses and was thinking she might want to go see the ENT regarding these issues. Denies any recent stressors or increase in anxiety.   Currently in Pain? No/denies        Neuromuscular Re-education:  VOR x 1 exercise: VOR x 1 horizontal with forward/retro ambulation 35' each x 3 bouts (2/10 with forward, 4/10 dizziness with retro); VOR X 1 horiz with conflicting background in standing on Airex, feet apart followed by feet together, 1 min first bout and then [name] only tolerates 45 seconds second bout (5/10);  Body Wall Rolls: Patient performed 2 reps of supported, body wall rolls 4 each direction with EO. Patient reports dizziness with this activity 5/10;   Newman Pies toss over shoulder: Patient performed multiple 71' trials of forward and retro ambulation while tossing ball over one shoulder with return catch  over opposite shoulder varying the ball position to head, shoulder and waist level with CGA.   Hallway ball toss  In hallway, worked on ball toss against one wall while tracking with eyes and head. Patient reports mild dizziness with this activity. Performed both directions;  Education about plan of care and plans for discharge in 2 more visits.                           [name] Education - 06/19/16 1526    Education provided Yes   Education Details HEP reinforced and progressed   Person(s) Educated Patient   Methods Explanation    Comprehension Verbalized understanding             [name] Long Term Goals - 05/24/16 0856      [name] LONG TERM GOAL #1   Title [name] will be independent with HEP in order to improve strength and balance in order to decrease fall risk and improve function at home and work.    Time 6   Period Weeks   Status New     [name] LONG TERM GOAL #2   Title [name] will decrease DHI score by at least 18 points in order to demonstrate clinically significant reduction in disability    Baseline 05/22/16: 22/100   Time 6   Period Weeks   Status New     [name] LONG TERM GOAL #3   Title [name] will be able to perform eyes closed, feet together balance on compliant surface for >30 seconds to demonstrates decreased risk for falling in low light conditions   Baseline 05/22/16: approximately 3 seconds   Time 6   Period Weeks   Status New     [name] LONG TERM GOAL #4   Title [name] will be able to continue working at computer while turning her head between 2 monitors without brining on her symptoms   Time 6   Period Weeks   Status New               Plan - 06/19/16 1527    Clinical Impression Statement [name] continues to report increase in dizziness with head turning activities. However she reports approximately 70% improvement in symptoms since starting therapy. Progressed VOR x 1 horizontal HEP to include forward/retro ambulation. [name] encouraged to continue additional HEP and follow-up as scheduled. [name] will have 2 more treatment session and then will be discharged to continue HEP indepenently. She has appointment scheduled with ENT for 07/05/16 to discuss her sinus congestion.    Rehab Potential Excellent   Clinical Impairments Affecting Rehab Potential Positive: peripheral findings, motivation; Negative: spontaneous onset of multiple horu duration   [name] Frequency 1x / week   [name] Duration 6 weeks   [name] Treatment/Interventions Biofeedback;Canalith Repostioning;Cryotherapy;Electrical Stimulation;Iontophoresis 4mg /ml  Dexamethasone;Moist Heat;Traction;Ultrasound;Gait training;Therapeutic exercise;Balance training;Neuromuscular re-education;Patient/family education;Manual techniques;Vestibular   [name] Next Visit Plan tandem progressions with head/body turns, hallway ambulation with ball toss over shoulder, quick turns in hallway, body rolls, stairs and medical mall   [name] Home Exercise Plan VOR x 1 horizontal in standing with feet together with conflicting background, VOR x 1 with forward/retro ambulation, VOR x 2 horizontal in sitting, tandem stance with horizontal head/body turns; body wall rolls   Consulted and Agree with Plan of Care Patient      Patient will benefit from skilled therapeutic intervention in order to improve the following deficits and impairments:  Dizziness  Visit Diagnosis: Dizziness and giddiness  Problem List Patient Active Problem List   Diagnosis Date Noted  . BPPV (benign paroxysmal positional vertigo), left 04/23/2016  . Chest pressure 07/02/2015  . Health care maintenance 03/19/2015  . GERD (gastroesophageal reflux disease) 02/12/2015  . History of colonic polyps 02/12/2015  . Urinary frequency 01/29/2015  . Abdominal pain, epigastric 06/23/2014  . Pain in the chest 06/23/2014  . Sinusitis, acute 05/16/2014  . Abnormal liver function tests 03/06/2014  . Thyroid fullness 03/06/2014  . Menopausal symptoms 03/04/2013  . Edema of both legs 02/03/2013  . Sinusitis 11/02/2012  . Family history of colonic polyps 08/27/2012  . Migraine headache 08/27/2012  . Nephrolithiasis 08/27/2012  . Endometriosis 08/27/2012   Katie [name], Katie [name]   Katie [name] 06/19/2016, 4:35 PM  [place] [facility] [address] , [place], [id number] Phone: [phone]   Fax:  [phone]  Name: Katie [name] MRN: [id number] Date of Birth: [birth year]

## 2016-06-26 ENCOUNTER — Ambulatory Visit: Payer: Managed Care, Other (non HMO) | Attending: Family Medicine

## 2016-06-26 ENCOUNTER — Encounter: Payer: Managed Care, Other (non HMO) | Admitting: Physical Therapy

## 2016-06-26 DIAGNOSIS — R42 Dizziness and giddiness: Secondary | ICD-10-CM | POA: Diagnosis present

## 2016-06-26 NOTE — Therapy (Signed)
[place] MAIN [facility] SERVICES [address] [place], [place], [address] Phone: [phone]   Fax:  [phone]  Physical Therapy Treatment/Discharge  Patient Details  Name: Katie [name] MRN: [id number] Date of Birth: 1966-05-08 Referring Provider: Tommi Rumps  Encounter Date: 06/26/2016      PT End of Session - 06/26/16 1525    Visit Number 6   Number of Visits 7   Date for PT Re-Evaluation 07/03/16   Authorization Type no g codes   PT Start Time 7673   PT Stop Time 1600   PT Time Calculation (min) 44 min   Equipment Utilized During Treatment Gait belt   Activity Tolerance Patient tolerated treatment well   Behavior During Therapy [facility] for tasks assessed/performed      Past Medical History:  Diagnosis Date  . Endometriosis   . Kidney stones   . Migraines   . Ovarian cyst     Past Surgical History:  Procedure Laterality Date  . ABDOMINAL HYSTERECTOMY  2000   left oophorectomy, Dr Burke Keels  . APPENDECTOMY  2000  . CESAREAN SECTION    . RIGHT OOPHORECTOMY  2002  . VAGINAL BIRTH AFTER CESAREAN SECTION      There were no vitals filed for this visit.      Subjective Assessment - 06/26/16 1524    Subjective Pt reports she is doing well today. She denies any further episodes of vertigo or dizziness and states that she is approximately 90% improved since starting therapy. She is performing HEP without issue and believes tha she is ready for discharge on this date. No specific questions or concerns.   Pertinent History Pt reports that at the end of August she was sitting still at her desk and had a brief bout of vertigo that lasted a few seconds. She still has intermittent bouts of vertigo 2-3 times/week. States that she also feels like she is in a fog. She gets sharp headaches in the top of her head. This is new since her symptoms started at the end of August. She has a history of migraines but those stopped after her  hysterectomy 16 years ago. She reports that she gets floaters with her headaches now. She is getting these headaches only approximately once/month. Denies numbness/tingling. Denies focal weakness. No chest pain, shortness of breath, or palpitations. Reports chills and sweats related to menopause/perimenopause. Denies unexplained weight gain/loss. Denies recent tick bites. States that she did have a sinus infection a few weeks before her symptoms started and initially thought that this might be related. She has been having a lot of trouble with there sinuses and was thinking she might want to go see the ENT regarding these issues. Denies any recent stressors or increase in anxiety.   Currently in Pain? No/denies       Neuromuscular Re-education:  Pt completed DHI (unbilled); Performed feet together eyes closed balance with patient on Airex 2 x 30 seconds, pt able to complete full 30 seconds without LOB; Updated goals with patient and discussed discharge; HEP reviewed and performed:  Airex Standing marches on Airex pad with eyes closed 30 seconds x 2;  Single Leg Balance Practiced single leg balance on each LE 2 x 30 seconds;  Body Wall Rolls:  Patient performed 6reps of supported with eyes open and 6 reps of supported with eyes closed. Reports minimal dizziness when stopping exercise; Patient educated about how she can progress difficulty at home with unsupported body rolls with eyes closed  at increased speed.  VOR x 1 VOR x 1 horizontal with forward/retro ambulation with conflicting background 51' each x 3 bouts (3/10 with forward, 4/10 dizziness with retro);  Hallway ball toss  In hallway, worked on ball tossing vertical, bouncing, and lateral tosses with therapist while tracking with eyes and head 75' x multiple trials each. Pt reports 3-4/10 dizziness with these activities.  Educated patient about discharge, HEP progression, and how to follow-up for additional therapy if symptoms  recur.                          PT Education - 06/26/16 1525    Education provided Yes   Education Details HEP reinforced, updated goals, discharge   Person(s) Educated Patient   Methods Explanation   Comprehension Verbalized understanding             PT Long Term Goals - 06/26/16 1520      PT LONG TERM GOAL #1   Title Pt will be independent with HEP in order to improve strength and balance in order to decrease fall risk and improve function at home and work.    Time 6   Period Weeks   Status Achieved     PT LONG TERM GOAL #2   Title Pt will decrease DHI score by at least 18 points in order to demonstrate clinically significant reduction in disability    Baseline 05/22/16: 22/100; 06/26/16: 6/100   Time 6   Period Weeks   Status Partially Met     PT LONG TERM GOAL #3   Title Pt will be able to perform eyes closed, feet together balance on compliant surface for >30 seconds to demonstrates decreased risk for falling in low light conditions   Baseline 05/22/16: approximately 3 seconds, 06/26/16: >30 seconds   Time 6   Period Weeks   Status Achieved     PT LONG TERM GOAL #4   Title Pt will be able to continue working at computer while turning her head between 2 monitors without brining on her symptoms   Baseline 06/26/16: no further symptoms with head turns between monitors   Time 6   Period Weeks   Status Achieved               Plan - 06/26/16 1525    Clinical Impression Statement Pt reports approximately 90% improvement in symptoms since starting therapy. She continues to report some very mild and brief dizziness immediately after performing her exercises but this is in no way limiting to her functionally. Her Dizziness Handicap Inventory decreased from 22/100 on initial evaluation to 6/100 at discharge. She was able to maintain her balance for greater than 30 seconds with eyes closed on compliant surface at discharge compared to 3 seconds on  initial evaluation. She reports no further dizziness with head turns between monitors at work and is independent performing her home exercise program. Pt will be discharged on this date with instruction to continue her home exercise program. Education provided regarding home progression to increase difficulty. Pt encouraged to return for additional therapy as needed with new order from MD.    Rehab Potential Excellent   Clinical Impairments Affecting Rehab Potential Positive: peripheral findings, motivation; Negative: spontaneous onset of multiple horu duration   PT Frequency 1x / week   PT Duration 6 weeks   PT Treatment/Interventions Biofeedback;Canalith Repostioning;Cryotherapy;Electrical Stimulation;Iontophoresis 29m/ml Dexamethasone;Moist Heat;Traction;Ultrasound;Gait training;Therapeutic exercise;Balance training;Neuromuscular re-education;Patient/family education;Manual techniques;Vestibular   PT Next Visit Plan  Discharge   PT Home Exercise Plan VOR x 1 horizontal in standing with feet together with conflicting background, VOR x 1 with forward/retro ambulation, VOR x 2 horizontal in sitting, tandem stance with horizontal head/body turns; body wall rolls   Consulted and Agree with Plan of Care Patient      Patient will benefit from skilled therapeutic intervention in order to improve the following deficits and impairments:  Dizziness  Visit Diagnosis: Dizziness and giddiness     Problem List Patient Active Problem List   Diagnosis Date Noted  . BPPV (benign paroxysmal positional vertigo), left 04/23/2016  . Chest pressure 07/02/2015  . Health care maintenance 03/19/2015  . GERD (gastroesophageal reflux disease) 02/12/2015  . History of colonic polyps 02/12/2015  . Urinary frequency 01/29/2015  . Abdominal pain, epigastric 06/23/2014  . Pain in the chest 06/23/2014  . Sinusitis, acute 05/16/2014  . Abnormal liver function tests 03/06/2014  . Thyroid fullness 03/06/2014  .  Menopausal symptoms 03/04/2013  . Edema of both legs 02/03/2013  . Sinusitis 11/02/2012  . Family history of colonic polyps 08/27/2012  . Migraine headache 08/27/2012  . Nephrolithiasis 08/27/2012  . Endometriosis 08/27/2012   Phillips Grout PT, DPT   Aqsa Sensabaugh 06/26/2016, 4:09 PM  [place] MAIN [facility] SERVICES [address] [place], [place], [address] Phone: [phone]   Fax:  [phone]  Name: Katie [name] MRN: [id number] Date of Birth: November 01, 1965

## 2016-07-02 ENCOUNTER — Encounter: Payer: Managed Care, Other (non HMO) | Admitting: Physical Therapy

## 2016-07-03 ENCOUNTER — Encounter: Payer: Managed Care, Other (non HMO) | Admitting: Physical Therapy

## 2016-07-08 ENCOUNTER — Encounter: Payer: Managed Care, Other (non HMO) | Admitting: Physical Therapy

## 2016-07-09 ENCOUNTER — Encounter: Payer: Managed Care, Other (non HMO) | Admitting: Physical Therapy

## 2016-07-17 ENCOUNTER — Encounter: Payer: Managed Care, Other (non HMO) | Admitting: Physical Therapy

## 2016-07-23 ENCOUNTER — Encounter: Payer: Managed Care, Other (non HMO) | Admitting: Physical Therapy

## 2016-07-30 ENCOUNTER — Encounter: Payer: Managed Care, Other (non HMO) | Admitting: Physical Therapy

## 2016-08-06 ENCOUNTER — Encounter: Payer: Managed Care, Other (non HMO) | Admitting: Physical Therapy

## 2016-08-13 ENCOUNTER — Encounter: Payer: Managed Care, Other (non HMO) | Admitting: Physical Therapy

## 2016-08-22 ENCOUNTER — Encounter: Payer: Self-pay | Admitting: *Deleted

## 2016-08-26 NOTE — Discharge Instructions (Signed)
[facility] °ENDOSCOPIC SINUS SURGERY ° [facility] ° °What is Functional Endoscopic Sinus Surgery? ° The Surgery involves making the natural openings of the sinuses larger by removing the bony partitions that separate the sinuses from the nasal cavity.  The natural sinus lining is preserved as much as possible to allow the sinuses to resume normal function after the surgery.  In some patients nasal polyps (excessively swollen lining of the sinuses) may be removed to relieve obstruction of the sinus openings.  The surgery is performed through the nose using lighted scopes, which eliminates the need for incisions on the face.  A septoplasty is a different procedure which is sometimes performed with sinus surgery.  It involves straightening the boy partition that separates the two sides of your nose.  A crooked or deviated septum may need repair if is obstructing the sinuses or nasal airflow.  Turbinate reduction is also often performed during sinus surgery.  The turbinates are bony proturberances from the side walls of the nose which swell and can obstruct the nose in patients with sinus and allergy problems.  Their size can be surgically reduced to help relieve nasal obstruction. ° °What Can Sinus Surgery Do For Me? ° Sinus surgery can reduce the frequency of sinus infections requiring antibiotic treatment.  This can provide improvement in nasal congestion, post-nasal drainage, facial pressure and nasal obstruction.  Surgery will NOT prevent you from ever having an infection again, so it usually only for patients who get infections 4 or more times yearly requiring antibiotics, or for infections that do not clear with antibiotics.  It will not cure nasal allergies, so patients with allergies may still require medication to treat their allergies after surgery. Surgery may improve headaches related to sinusitis, however, some people will continue to  require medication to control sinus headaches related to allergies.  Surgery will do nothing for other forms of headache (migraine, tension or cluster). ° °What Are the Risks of Endoscopic Sinus Surgery? ° Current techniques allow surgery to be performed safely with little risk, however, there are rare complications that patients should be aware of.  Because the sinuses are located around the eyes, there is risk of eye injury, including blindness, though again, this would be quite rare. This is usually a result of bleeding behind the eye during surgery, which puts the vision oat risk, though there are treatments to protect the vision and prevent permanent disrupted by surgery causing a leak of the spinal fluid that surrounds the brain.  More serious complications would include bleeding inside the brain cavity or damage to the brain.  Again, all of these complications are uncommon, and spinal fluid leaks can be safely managed surgically if they occur.  The most common complication of sinus surgery is bleeding from the nose, which may require packing or cauterization of the nose.  Continued sinus have polyps may experience recurrence of the polyps requiring revision surgery.  Alterations of sense of smell or injury to the tear ducts are also rare complications.  ° °What is the Surgery Like, and what is the Recovery? ° The Surgery usually takes a couple of hours to perform, and is usually performed under a general anesthetic (completely asleep).  Patients are usually discharged home after a couple of hours.  Sometimes during surgery it is necessary to pack the nose to control bleeding, and the packing is left in place for 24 - 48 hours, and removed by your surgeon.    If a septoplasty was performed during the procedure, there is often a splint placed which must be removed after 5-7 days.   °Discomfort: Pain is usually mild to moderate, and can be controlled by prescription pain medication or acetaminophen (Tylenol).   Aspirin, Ibuprofen (Advil, Motrin), or Naprosyn (Aleve) should be avoided, as they can cause increased bleeding.  Most patients feel sinus pressure like they have a bad head cold for several days.  Sleeping with your head elevated can help reduce swelling and facial pressure, as can ice packs over the face.  A humidifier may be helpful to keep the mucous and blood from drying in the nose.  ° °Diet: There are no specific diet restrictions, however, you should generally start with clear liquids and a light diet of bland foods because the anesthetic can cause some nausea.  Advance your diet depending on how your stomach feels.  Taking your pain medication with food will often help reduce stomach upset which pain medications can cause. ° °Nasal Saline Irrigation: It is important to remove blood clots and dried mucous from the nose as it is healing.  This is done by having you irrigate the nose at least 3 - 4 times daily with a salt water solution.  We recommend using NeilMed Sinus Rinse (available at the drug store).  Fill the squeeze bottle with the solution, bend over a sink, and insert the tip of the squeeze bottle into the nose ½ of an inch.  Point the tip of the squeeze bottle towards the inside corner of the eye on the same side your irrigating.  Squeeze the bottle and gently irrigate the nose.  If you bend forward as you do this, most of the fluid will flow back out of the nose, instead of down your throat.   The solution should be warm, near body temperature, when you irrigate.   Each time you irrigate, you should use a full squeeze bottle.  ° °Note that if you are instructed to use Nasal Steroid Sprays at any time after your surgery, irrigate with saline BEFORE using the steroid spray, so you do not wash it all out of the nose. °Another product, Nasal Saline Gel (such as AYR Nasal Saline Gel) can be applied in each nostril 3 - 4 times daily to moisture the nose and reduce scabbing or crusting. ° °Bleeding:   Bloody drainage from the nose can be expected for several days, and patients are instructed to irrigate their nose frequently with salt water to help remove mucous and blood clots.  The drainage may be dark red or brown, though some fresh blood may be seen intermittently, especially after irrigation.  Do not blow you nose, as bleeding may occur. If you must sneeze, keep your mouth open to allow air to escape through your mouth. ° °If heavy bleeding occurs: Irrigate the nose with saline to rinse out clots, then spray the nose 3 - 4 times with Afrin Nasal Decongestant Spray.  The spray will constrict the blood vessels to slow bleeding.  Pinch the lower half of your nose shut to apply pressure, and lay down with your head elevated.  Ice packs over the nose may help as well. If bleeding persists despite these measures, you should notify your doctor.  Do not use the Afrin routinely to control nasal congestion after surgery, as it can result in worsening congestion and may affect healing.  ° °Activity: Return to work varies among patients. Most patients will be out of   work at least 5 - 7 days to recover.  Patient may return to work after they are off of narcotic pain medication, and feeling well enough to perform the functions of their job.  Patients must avoid heavy lifting (over 10 pounds) or strenuous physical for 2 weeks after surgery, so your employer may need to assign you to light duty, or keep you out of work longer if light duty is not possible.  NOTE: you should not drive, operate dangerous machinery, do any mentally demanding tasks or make any important legal or financial decisions while on narcotic pain medication and recovering from the general anesthetic.  °  °Call Your Doctor Immediately if You Have Any of the Following: °1. Bleeding that you cannot control with the above measures °2. Loss of vision, double vision, bulging of the eye or black eyes. °3. Fever over 101 degrees °4. Neck stiffness with severe  headache, fever, nausea and change in mental state. °You are always encourage to call anytime with concerns, however, please call with requests for pain medication refills during office hours. ° °Office Endoscopy: During follow-up visits your doctor will remove any packing or splints that may have been placed and evaluate and clean your sinuses endoscopically.  Topical anesthetic will be used to make this as comfortable as possible, though you may want to take your pain medication prior to the visit.  How often this will need to be done varies from patient to patient.  After complete recovery from the surgery, you may need follow-up endoscopy from time to time, particularly if there is concern of recurrent infection or nasal polyps. ° ° °General Anesthesia, Adult, Care After °These instructions provide you with information about caring for yourself after your procedure. Your health care provider may also give you more specific instructions. Your treatment has been planned according to current medical practices, but problems sometimes occur. Call your health care provider if you have any problems or questions after your procedure. °What can I expect after the procedure? °After the procedure, it is common to have: °· Vomiting. °· A sore throat. °· Mental slowness. °It is common to feel: °· Nauseous. °· Cold or shivery. °· Sleepy. °· Tired. °· Sore or achy, even in parts of your body where you did not have surgery. °Follow these instructions at home: °For at least 24 hours after the procedure: °· Do not: °¨ Participate in activities where you could fall or become injured. °¨ Drive. °¨ Use heavy machinery. °¨ Drink alcohol. °¨ Take sleeping pills or medicines that cause drowsiness. °¨ Make important decisions or sign legal documents. °¨ Take care of children on your own. °· Rest. °Eating and drinking °· If you vomit, drink water, juice, or soup when you can drink without vomiting. °· Drink enough fluid to keep your  urine clear or pale yellow. °· Make sure you have little or no nausea before eating solid foods. °· Follow the diet recommended by your health care provider. °General instructions °· Have a responsible adult stay with you until you are awake and alert. °· Return to your normal activities as told by your health care provider. Ask your health care provider what activities are safe for you. °· Take over-the-counter and prescription medicines only as told by your health care provider. °· If you smoke, do not smoke without supervision. °· Keep all follow-up visits as told by your health care provider. This is important. °Contact a health care provider if: °· You continue to have nausea   or vomiting at home, and medicines are not helpful. °· You cannot drink fluids or start eating again. °· You cannot urinate after 8-12 hours. °· You develop a skin rash. °· You have fever. °· You have increasing redness at the site of your procedure. °Get help right away if: °· You have difficulty breathing. °· You have chest pain. °· You have unexpected bleeding. °· You feel that you are having a life-threatening or urgent problem. °This information is not intended to replace advice given to you by your health care provider. Make sure you discuss any questions you have with your health care provider. °Document Released: 10/14/2000 Document Revised: 12/11/2015 Document Reviewed: 06/22/2015 °Elsevier Interactive Patient Education © 2017 [employer] ° °

## 2016-08-29 ENCOUNTER — Ambulatory Visit: Payer: Managed Care, Other (non HMO) | Admitting: Anesthesiology

## 2016-08-29 ENCOUNTER — Encounter
Admission: RE | Disposition: A | Discharge status: Home / Self Care | Payer: Self-pay | Source: Ambulatory Visit | Attending: Otolaryngology

## 2016-08-29 ENCOUNTER — Ambulatory Visit
Admission: RE | Admit: 2016-08-29 | Discharge: 2016-08-29 | Disposition: A | Discharge status: Home / Self Care | Payer: Managed Care, Other (non HMO) | Source: Ambulatory Visit | Attending: Otolaryngology | Admitting: Otolaryngology

## 2016-08-29 DIAGNOSIS — J343 Hypertrophy of nasal turbinates: Secondary | ICD-10-CM | POA: Diagnosis not present

## 2016-08-29 DIAGNOSIS — J323 Chronic sphenoidal sinusitis: Secondary | ICD-10-CM | POA: Insufficient documentation

## 2016-08-29 DIAGNOSIS — J329 Chronic sinusitis, unspecified: Secondary | ICD-10-CM | POA: Diagnosis present

## 2016-08-29 DIAGNOSIS — J342 Deviated nasal septum: Secondary | ICD-10-CM | POA: Insufficient documentation

## 2016-08-29 HISTORY — DX: Reserved for concepts with insufficient information to code with codable children: IMO0002

## 2016-08-29 HISTORY — PX: IMAGE GUIDED SINUS SURGERY: SHX6570

## 2016-08-29 HISTORY — PX: SPHENOIDECTOMY: SHX2421

## 2016-08-29 HISTORY — PX: SEPTOPLASTY: SHX2393

## 2016-08-29 SURGERY — SINUS SURGERY, WITH IMAGING GUIDANCE
Anesthesia: Monitor Anesthesia Care | Laterality: Right | Wound class: Clean Contaminated

## 2016-08-29 MED ORDER — ACETAMINOPHEN 160 MG/5ML PO SOLN: 325.0000 mg | ORAL | Status: DC | PRN

## 2016-08-29 MED ORDER — HYDROMORPHONE HCL 1 MG/ML IJ SOLN
0.2500 mg | INTRAMUSCULAR | Status: DC | PRN
  Administered 2016-08-29: 0.25 mg via INTRAVENOUS

## 2016-08-29 MED ORDER — GLYCOPYRROLATE 0.2 MG/ML IJ SOLN
INTRAMUSCULAR | Status: DC | PRN
  Administered 2016-08-29: .1 mg via INTRAVENOUS

## 2016-08-29 MED ORDER — LIDOCAINE HCL 1 % IJ SOLN
INTRAMUSCULAR | Status: DC | PRN
  Administered 2016-08-29: 10 mL

## 2016-08-29 MED ORDER — LIDOCAINE-EPINEPHRINE 2 %-1:100000 IJ SOLN
INTRAMUSCULAR | Status: DC | PRN
  Administered 2016-08-29: 9 mL

## 2016-08-29 MED ORDER — PROPOFOL 10 MG/ML IV BOLUS
INTRAVENOUS | Status: DC | PRN
  Administered 2016-08-29: 150 mg via INTRAVENOUS

## 2016-08-29 MED ORDER — SODIUM CHLORIDE 0.9 % IV SOLN
600.0000 mg | Freq: Once | INTRAVENOUS | Status: AC
  Administered 2016-08-29: 600 mg via INTRAVENOUS

## 2016-08-29 MED ORDER — FENTANYL CITRATE (PF) 100 MCG/2ML IJ SOLN
INTRAMUSCULAR | Status: DC | PRN
  Administered 2016-08-29: 100 ug via INTRAVENOUS

## 2016-08-29 MED ORDER — LACTATED RINGERS IV SOLN
INTRAVENOUS | Status: DC
  Administered 2016-08-29: 08:00:00 via INTRAVENOUS

## 2016-08-29 MED ORDER — ONDANSETRON HCL 4 MG/2ML IJ SOLN
4.0000 mg | Freq: Once | INTRAMUSCULAR | Status: DC | PRN
Start: 2016-08-29 — End: 2016-08-29

## 2016-08-29 MED ORDER — ONDANSETRON HCL 4 MG/2ML IJ SOLN
INTRAMUSCULAR | Status: DC | PRN
  Administered 2016-08-29: 4 mg via INTRAVENOUS

## 2016-08-29 MED ORDER — ACETAMINOPHEN 325 MG PO TABS: 325.0000 mg | ORAL_TABLET | ORAL | Status: DC | PRN

## 2016-08-29 MED ORDER — ACETAMINOPHEN 10 MG/ML IV SOLN
INTRAVENOUS | Status: DC | PRN
  Administered 2016-08-29: 1000 mg via INTRAVENOUS

## 2016-08-29 MED ORDER — DEXAMETHASONE SODIUM PHOSPHATE 4 MG/ML IJ SOLN
INTRAMUSCULAR | Status: DC | PRN
  Administered 2016-08-29: 10 mg via INTRAVENOUS

## 2016-08-29 MED ORDER — MIDAZOLAM HCL 5 MG/5ML IJ SOLN
INTRAMUSCULAR | Status: DC | PRN
  Administered 2016-08-29: 2 mg via INTRAVENOUS

## 2016-08-29 MED ORDER — LACTATED RINGERS IV SOLN: INTRAVENOUS | Status: DC

## 2016-08-29 MED ORDER — LIDOCAINE HCL (CARDIAC) 20 MG/ML IV SOLN
INTRAVENOUS | Status: DC | PRN
  Administered 2016-08-29: 40 mg via INTRAVENOUS

## 2016-08-29 MED ORDER — OXYCODONE HCL 5 MG PO TABS
5.0000 mg | ORAL_TABLET | Freq: Once | ORAL | Status: AC | PRN
  Administered 2016-08-29: 5 mg via ORAL

## 2016-08-29 MED ORDER — ROCURONIUM BROMIDE 100 MG/10ML IV SOLN
INTRAVENOUS | Status: DC | PRN
  Administered 2016-08-29: 25 mg via INTRAVENOUS

## 2016-08-29 MED ORDER — OXYCODONE HCL 5 MG/5ML PO SOLN: 5.0000 mg | Freq: Once | ORAL | Status: AC | PRN

## 2016-08-29 MED ORDER — OXYMETAZOLINE HCL 0.05 % NA SOLN
2.0000 | Freq: Once | NASAL | Status: AC
  Administered 2016-08-29: 2 via NASAL

## 2016-08-29 SURGICAL SUPPLY — 38 items
BALLOON SINUPLASTY SYSTEM (BALLOONS) IMPLANT
BATTERY INSTRU NAVIGATION (MISCELLANEOUS) ×20 IMPLANT
CANISTER SUCT 1200ML W/VALVE (MISCELLANEOUS) ×5 IMPLANT
CATH IV 18X1 1/4 SAFELET (CATHETERS) ×5 IMPLANT
COAGULATOR SUCT 8FR VV (MISCELLANEOUS) ×5 IMPLANT
DEVICE INFLATION SEID (MISCELLANEOUS) IMPLANT
DRAPE HEAD BAR (DRAPES) ×5 IMPLANT
GLOVE PI ULTRA LF STRL 7.5 (GLOVE) ×6 IMPLANT
GLOVE PI ULTRA NON LATEX 7.5 (GLOVE) ×4
IV CATH 18X1 1/4 SAFELET (CATHETERS) ×3
IV NS 500ML (IV SOLUTION) ×2
IV NS 500ML BAXH (IV SOLUTION) ×3 IMPLANT
KIT ROOM TURNOVER OR (KITS) ×5 IMPLANT
NAVIGATION MASK REG  ST (MISCELLANEOUS) ×5 IMPLANT
NEEDLE ANESTHESIA  27G X 3.5 (NEEDLE) ×2
NEEDLE ANESTHESIA 27G X 3.5 (NEEDLE) ×3 IMPLANT
NEEDLE SPNL 25GX3.5 QUINCKE BL (NEEDLE) ×5 IMPLANT
NS IRRIG 500ML POUR BTL (IV SOLUTION) ×5 IMPLANT
PACK DRAPE NASAL/ENT (PACKS) ×5 IMPLANT
PACKING NASAL EPIS 4X2.4 XEROG (MISCELLANEOUS) ×10 IMPLANT
PAD GROUND ADULT SPLIT (MISCELLANEOUS) ×5 IMPLANT
PATTIES SURGICAL .5 X3 (DISPOSABLE) ×5 IMPLANT
SHAVER DIEGO BLD STD TYPE A (BLADE) IMPLANT
SOL ANTI-FOG 6CC FOG-OUT (MISCELLANEOUS) ×3 IMPLANT
SOL FOG-OUT ANTI-FOG 6CC (MISCELLANEOUS) ×2
SPLINT NASAL POSISEP HE (MISCELLANEOUS)
SPLINT NASAL POSISEP X .6X2 (MISCELLANEOUS) IMPLANT
SPLINT NASAL SEPTAL BLV .50 ST (MISCELLANEOUS) ×5 IMPLANT
STRAP BODY AND KNEE 60X3 (MISCELLANEOUS) ×5 IMPLANT
SUT CHROMIC 3-0 (SUTURE)
SUT CHROMIC 3-0 KS 27XMFL CR (SUTURE)
SUT ETHILON 3-0 KS 30 BLK (SUTURE) ×5 IMPLANT
SUT PLAIN GUT 4-0 (SUTURE) ×5 IMPLANT
SUTURE CHRMC 3-0 KS 27XMFL CR (SUTURE) IMPLANT
SYR 3ML LL SCALE MARK (SYRINGE) ×5 IMPLANT
TOWEL OR 17X26 4PK STRL BLUE (TOWEL DISPOSABLE) ×5 IMPLANT
TUBING DECLOG (TUBING) IMPLANT
WATER STERILE IRR 250ML POUR (IV SOLUTION) ×5 IMPLANT

## 2016-08-29 NOTE — Anesthesia Procedure Notes (Signed)
Procedure Name: Intubation Date/Time: 08/29/2016 8:57 AM Performed by: Jimmy PicketAMYOT, Dee Paden Pre-anesthesia Checklist: Patient identified, Emergency Drugs available, Suction available, Patient being monitored and Timeout performed Patient Re-evaluated:Patient Re-evaluated prior to inductionOxygen Delivery Method: Circle system utilized Preoxygenation: Pre-oxygenation with 100% oxygen Intubation Type: IV induction Ventilation: Mask ventilation without difficulty Laryngoscope Size: Miller and 2 Grade View: Grade I Tube type: Oral Rae Tube size: 7.0 mm Number of attempts: 1 Placement Confirmation: ETT inserted through vocal cords under direct vision,  positive ETCO2 and breath sounds checked- equal and bilateral Tube secured with: Tape Dental Injury: Teeth and Oropharynx as per pre-operative assessment

## 2016-08-29 NOTE — Transfer of Care (Signed)
Immediate Anesthesia Transfer of Care Note  Patient: Katie [name]  Procedure(s) Performed: Procedure(s) with comments: IMAGE GUIDED SINUS SURGERY (Bilateral) - NEED DISK GAVE DISK TO CECE 1-23 KP SPHENOIDECTOMY  with removal content right (Right) SEPTOPLASTY (Bilateral)  Patient Location: PACU  Anesthesia Type: MAC  Level of Consciousness: awake, alert  and patient cooperative  Airway and Oxygen Therapy: Patient Spontanous Breathing and Patient connected to supplemental oxygen  Post-op Assessment: Post-op Vital signs reviewed, Patient's Cardiovascular Status Stable, Respiratory Function Stable, Patent Airway and No signs of Nausea or vomiting  Post-op Vital Signs: Reviewed and stable  Complications: No apparent anesthesia complications

## 2016-08-29 NOTE — Anesthesia Preprocedure Evaluation (Addendum)
Anesthesia Evaluation  Patient identified by MRN, date of birth, ID band Patient awake    Reviewed: Allergy & Precautions, H&P , NPO status , Patient's Chart, lab work & pertinent test results, reviewed documented beta blocker date and time   Airway Mallampati: II  TM Distance: >3 FB Neck ROM: full    Dental no notable dental hx.    Pulmonary neg pulmonary ROS,    Pulmonary exam normal breath sounds clear to auscultation       Cardiovascular Exercise Tolerance: Good negative cardio ROS Normal cardiovascular exam Rhythm:regular Rate:Normal     Neuro/Psych  Headaches, negative psych ROS   GI/Hepatic Neg liver ROS, GERD  Controlled,  Endo/Other  negative endocrine ROS  Renal/GU negative Renal ROS  negative genitourinary   Musculoskeletal   Abdominal   Peds  Hematology negative hematology ROS (+)   Anesthesia Other Findings   Reproductive/Obstetrics negative OB ROS                             Anesthesia Physical Anesthesia Plan  ASA: II  Anesthesia Plan: General   Post-op Pain Management:    Induction: Intravenous  Airway Management Planned: Oral ETT  Additional Equipment:   Intra-op Plan:   Post-operative Plan:   Informed Consent: I have reviewed the patients History and Physical, chart, labs and discussed the procedure including the risks, benefits and alternatives for the proposed anesthesia with the patient or authorized representative who has indicated his/her understanding and acceptance.   Dental Advisory Given  Plan Discussed with: CRNA  Anesthesia Plan Comments:        Anesthesia Quick Evaluation

## 2016-08-29 NOTE — H&P (Signed)
  H&P has been reviewed and no changes necessary. To be downloaded later. 

## 2016-08-29 NOTE — Anesthesia Postprocedure Evaluation (Signed)
Anesthesia Post Note  Patient: Reggy EyeBernice M Wann  Procedure(s) Performed: Procedure(s) (LRB): IMAGE GUIDED SINUS SURGERY (Bilateral) SPHENOIDECTOMY  with removal content right (Right) SEPTOPLASTY (Bilateral)  Patient location during evaluation: PACU Anesthesia Type: General Level of consciousness: awake and alert Pain management: pain level controlled Vital Signs Assessment: post-procedure vital signs reviewed and stable Respiratory status: spontaneous breathing, nonlabored ventilation, respiratory function stable and patient connected to nasal cannula oxygen Cardiovascular status: blood pressure returned to baseline and stable Postop Assessment: no signs of nausea or vomiting Anesthetic complications: no    Alta CorningBacon, Florita Nitsch S

## 2016-08-29 NOTE — Op Note (Signed)
08/29/2016  10:21 AM  [id number]   Pre-Op Dx: Chronic right sphenoid sinusitis, Deviated Nasal Septum, enlarged turbinates  Post-op Dx: Same  Proc: Right endoscopic sphenoid sinusotomy with removal of contents, Nasal Septoplasty, outfracture Inferior Turbinates   Surg:  Torryn Hudspeth H  Anes:  GOT  EBL:  100 mL  Comp:  None  Findings: Right ethmoid plate was buckled to the right side blocking the area medial to the right middle turbinate. The septum had to be repaired first to open up this area for visualization. The sphenoid sinus natural ostium could not be found. This could not be visualized on x-ray either. A small chisel was needed to fracture the anterior bone to open up into the sinus, was filled with creamy white fluid that was fairly thick.  Procedure: With the patient in a comfortable supine position,  general orotracheal anesthesia was induced without difficulty.     The patient received preoperative Afrin spray for topical decongestion and vasoconstriction.  Intravenous prophylactic antibiotics were administered.  At an appropriate level, the patient was placed in a semi-sitting position.  Nasal vibrissae were trimmed.   1% Xylocaine with 1:100,000 epinephrine, 6 cc's, was infiltrated into the anterior floor of the nose, into the nasal spine region, into the membranous columella, and finally into the submucoperichondrial plane of the septum on both sides.  Several minutes were allowed for this to take effect.  Cottoniod pledgetts soaked in Afrin and 4% Xylocaine were placed into both nasal cavities and left while the patient was prepped and draped in the standard fashion.  The materials were removed from the nose and observed to be intact and correct in number.  The nose was inspected with a headlight and the 0 scope with the findings as described above. The image guided system was brought in and the CT scan was downloaded from the disc. The template was applied the face and the  template was registered to the system. The suction instruments was then registered the system as well. There is 0.8 mm of variance. At first the suction instruments did not line up well and the patient had to be reregistered. This time there was 0.7 mm of variance and showed very good alignment of the suction instruments with the intranasal anatomy.  A left Killian incision was sharply executed and carried down to the quadrangular cartilage. The mucoperichondrium was elelvated along the quadrangular plate back to the bony-cartilaginous junction. The mucoperiostium was then elevated along the ethmoid plate and the vomer. The boney-catilaginous junction was then split with a freer elevator and the mucoperiosteum was elevated on the opposite side. The mucoperiosteum was then elevated along the maxillary crest as needed to expose the crooked bone of the crest.  Boney spurs of the vomer and maxillary crest were removed with Lenoria Chime forceps.  The cartilaginous plate was trimmed along its posterior and inferior borders of about 2 mm of cartilage to free it up inferiorly. Some of the deviated ethmoid plate was then fractured and removed with Takahashi forceps to free up the posterior border of the quadrangular plate and allow it to swing back to the midline. The main obstruction was the anterior portion of the ethmoid plate that was buckled to the right side. The mucosal flaps were placed back into their anatomic position to allow visualization of the airways. The septum now sat in the midline with an improved airway.  A 3-0 Chromic suture on a Keith needle in used to anchor the inferior septum at the nasal  spine with a through and through suture. The mucosal flaps are then sutured together using a through and through whip stitch of 4-0 Plain Gut with a mini-Keith needle. This was used to close the [place] incision as well.   The 0 scope was then used for visualization of the right airway. The middle turbinate  was infractured slightly to provide and up into the sphenoid groove to look for the opening to the sphenoid sinus. On the image guided system there was no opening seen and I could not find an opening here either. Used a 3 mm chisel to raise small opening on the anterior wall and rotated it to help fracture the wall slightly to open up more. You could see white creamy mucus rolling out of the sinus once was open. Hemostasis was suction away. A small sphenoid punch was then used to bite away at the opening and make it wider. It was left about a centimeter half in height and 1/2 cm in width. With the 0 and 30 scope she can see into the sinus and there is still creamy mucus that was inferior. I took the wall down a little more inferiorly second get this area and used curved suction to clean out the bottom the sphenoid sinus so that nothing was left collecting in the sinus. The sinus was now completely opened. Then filled the right sphenoid sinus and opening with PosiSep to create a liquid dressing and the sinus prevent blood clots and scabbing at the opening of the sinus. This will dissolve in 3-4 days  The inferior turbinates were then inspected. Were outfractured greatly little more room inferiorly in the nose, but no trimming was done. The left middle turbinate was visualized with the 0 scope and it seemed to be sitting pretty good position. This was large  on the CT scan with paradoxical curve. This did  not  seem to cause obstruction of the nasal airway even with the septum straight. PosiSep was then placed on both sides of the middle turbinates and soaked and any scar tissue.  The airways were then visualized and showed open passageways on both sides that were significantly improved compared to before surgery. There was no signifcant bleeding. Nasal splints were applied to both sides of the septum using Xomed 0.545mm regular sized splints that were trimmed, and then held in position with a 3-0 Nylon through  and through suture.  The patient was turned back over to anesthesia, and awakened, extubated, and taken to the PACU in satisfactory condition.  Dispo:   PACU to home  Plan: Ice, elevation, narcotic analgesia, steroid taper, and prophylactic antibiotics for the duration of indwelling nasal foreign bodies.  We will reevaluate the patient in the office in 6 days and remove the septal splints.  Return to work in 10 days, strenuous activities in two weeks.   Miyoshi Ligas H 08/29/2016 10:21 AM

## 2016-08-30 ENCOUNTER — Encounter: Payer: Self-pay | Admitting: Otolaryngology

## 2016-09-02 LAB — SURGICAL PATHOLOGY

## 2016-11-26 ENCOUNTER — Other Ambulatory Visit: Payer: Self-pay | Admitting: Gastroenterology

## 2016-11-26 DIAGNOSIS — R1084 Generalized abdominal pain: Secondary | ICD-10-CM

## 2016-12-09 ENCOUNTER — Ambulatory Visit
Admission: RE | Admit: 2016-12-09 | Discharge: 2016-12-09 | Disposition: A | Discharge status: Home / Self Care | Payer: Managed Care, Other (non HMO) | Source: Ambulatory Visit | Attending: Gastroenterology | Admitting: Gastroenterology

## 2016-12-09 DIAGNOSIS — R197 Diarrhea, unspecified: Secondary | ICD-10-CM | POA: Diagnosis not present

## 2016-12-09 DIAGNOSIS — Z90710 Acquired absence of both cervix and uterus: Secondary | ICD-10-CM | POA: Insufficient documentation

## 2016-12-09 DIAGNOSIS — R1084 Generalized abdominal pain: Secondary | ICD-10-CM | POA: Diagnosis not present

## 2017-01-30 ENCOUNTER — Other Ambulatory Visit: Payer: Self-pay | Admitting: Internal Medicine

## 2017-01-30 DIAGNOSIS — Z1231 Encounter for screening mammogram for malignant neoplasm of breast: Secondary | ICD-10-CM

## 2017-02-18 ENCOUNTER — Other Ambulatory Visit: Payer: Self-pay | Admitting: Gastroenterology

## 2017-02-18 DIAGNOSIS — R1011 Right upper quadrant pain: Secondary | ICD-10-CM

## 2017-03-05 ENCOUNTER — Ambulatory Visit
Admission: RE | Admit: 2017-03-05 | Discharge: 2017-03-05 | Disposition: A | Discharge status: Home / Self Care | Payer: Managed Care, Other (non HMO) | Source: Ambulatory Visit | Attending: Gastroenterology | Admitting: Gastroenterology

## 2017-03-05 DIAGNOSIS — R1011 Right upper quadrant pain: Secondary | ICD-10-CM | POA: Diagnosis not present

## 2017-03-05 DIAGNOSIS — G8929 Other chronic pain: Secondary | ICD-10-CM | POA: Diagnosis not present

## 2017-03-05 MED ORDER — TECHNETIUM TC 99M MEBROFENIN IV KIT
5.1010 | PACK | Freq: Once | INTRAVENOUS | Status: AC | PRN
  Administered 2017-03-05: 5.101 via INTRAVENOUS

## 2017-03-21 ENCOUNTER — Encounter: Payer: Self-pay | Admitting: Internal Medicine

## 2017-03-21 ENCOUNTER — Ambulatory Visit (INDEPENDENT_AMBULATORY_CARE_PROVIDER_SITE_OTHER): Payer: Managed Care, Other (non HMO) | Admitting: Internal Medicine

## 2017-03-21 VITALS — BP 134/68 | HR 85 | Temp 98.7°F | Resp 12 | Ht 66.0 in | Wt 221.6 lb

## 2017-03-21 DIAGNOSIS — R1013 Epigastric pain: Secondary | ICD-10-CM

## 2017-03-21 DIAGNOSIS — Z23 Encounter for immunization: Secondary | ICD-10-CM

## 2017-03-21 DIAGNOSIS — Z0000 Encounter for general adult medical examination without abnormal findings: Secondary | ICD-10-CM

## 2017-03-21 DIAGNOSIS — K219 Gastro-esophageal reflux disease without esophagitis: Secondary | ICD-10-CM

## 2017-03-21 DIAGNOSIS — R7989 Other specified abnormal findings of blood chemistry: Secondary | ICD-10-CM

## 2017-03-21 DIAGNOSIS — R350 Frequency of micturition: Secondary | ICD-10-CM

## 2017-03-21 DIAGNOSIS — R945 Abnormal results of liver function studies: Secondary | ICD-10-CM | POA: Diagnosis not present

## 2017-03-21 DIAGNOSIS — E0789 Other specified disorders of thyroid: Secondary | ICD-10-CM | POA: Diagnosis not present

## 2017-03-21 DIAGNOSIS — H8112 Benign paroxysmal vertigo, left ear: Secondary | ICD-10-CM

## 2017-03-21 DIAGNOSIS — Z8371 Family history of colonic polyps: Secondary | ICD-10-CM | POA: Diagnosis not present

## 2017-03-21 MED ORDER — PANTOPRAZOLE SODIUM 40 MG PO TBEC: DELAYED_RELEASE_TABLET | ORAL | 2 refills | Status: DC

## 2017-03-21 NOTE — Assessment & Plan Note (Addendum)
Physical today 03/21/17.  PAP 03/14/15 negative with negative HPV.  Mammogram 05/01/16 - Birads I.  Pt scheduled for f/u mammogram.

## 2017-03-21 NOTE — Progress Notes (Signed)
Subjective:    Patient ID: Katie [name], female    DOB: 04-25-1966, 51 y.o.   MRN: [id number]  HPI  Patient here for her physical exam.  She was seen here in the fall and diagnosed with BPPV.  Note reviewed.  Referred to vestibular rehab.  Also saw ENT for reoccurring sinus issues.  Is s/p sinus surgery - 08/2016.  Doing better.  No chest pain.  Breathing stable.  Has been having issues with RUQ and epigastric discomfort.  Saw GI.  Is s/p CT - unrevealing.  In review, in 2016 had EGD with erosive gastritis and reflux esophagitis.  Colonoscopy with three hyperplastic polyps.  Recommended f/u in 2021.  Diagnosed with IBSD.  Instructed to take citrucel and probiotic.  Also found to have elevated LFTs.  Labs obtained.  HIDA scan ordered - normal.  Has f/u soon.  The citracel and probiotic - helping some.  Still with some issues.  Plans to keep f/u with GI.     Past Medical History:  Diagnosis Date  . Endometriosis   . Kidney stones   . Migraines   . Ovarian cyst   . Positional vertigo    PT helped   Past Surgical History:  Procedure Laterality Date  . ABDOMINAL HYSTERECTOMY  2000   left oophorectomy, Dr Mia CreekBerliner  . APPENDECTOMY  2000  . CESAREAN SECTION    . IMAGE GUIDED SINUS SURGERY Bilateral 08/29/2016   Procedure: IMAGE GUIDED SINUS SURGERY;  Surgeon: Vernie MurdersPaul Juengel, MD;  [facility];  Service: ENT;  Laterality: Bilateral;  NEED DISK GAVE DISK TO CECE 1-23 KP  . RIGHT OOPHORECTOMY  2002  . SEPTOPLASTY Bilateral 08/29/2016   Procedure: SEPTOPLASTY;  Surgeon: Vernie MurdersPaul Juengel, MD;  [facility];  Service: ENT;  Laterality: Bilateral;  . SPHENOIDECTOMY Right 08/29/2016   Procedure: SPHENOIDECTOMY  with removal content right;  Surgeon: Vernie MurdersPaul Juengel, MD;  [facility];  Service: ENT;  Laterality: Right;  Marland Kitchen. VAGINAL BIRTH AFTER CESAREAN SECTION     Family History  Problem Relation Age of Onset  . Hypertension Mother   . Diabetes Mother   .  Thyroid cancer Mother   . Colon polyps Mother   . Thyroid cancer Maternal Aunt   . Colon polyps Maternal Aunt   . Breast cancer Neg Hx    Social History   Social History  . Marital status: Married    Spouse name: N/A  . Number of children: 2  . Years of education: N/A   Social History Main Topics  . Smoking status: Never Smoker  . Smokeless tobacco: Never Used  . Alcohol use No  . Drug use: No  . Sexual activity: Not Asked   Other Topics Concern  . None   Social History Narrative  . None    Outpatient Encounter Prescriptions as of 03/21/2017  Medication Sig  . [DISCONTINUED] fluticasone (FLONASE) 50 MCG/ACT nasal spray Place 2 sprays into both nostrils daily.  . pantoprazole (PROTONIX) 40 MG tablet Take one tablet 30 minutes before breakfast   No facility-administered encounter medications on file as of 03/21/2017.     Review of Systems  Constitutional: Negative for appetite change and unexpected weight change.  HENT: Negative for congestion and sinus pressure.   Eyes: Negative for pain and visual disturbance.  Respiratory: Negative for cough, chest tightness and shortness of breath.   Cardiovascular: Negative for chest pain, palpitations and leg swelling.  Gastrointestinal: Negative for nausea and vomiting.  Abdominal pain as outlined.  Bowels better.   Genitourinary: Negative for difficulty urinating and dysuria.  Musculoskeletal: Negative for back pain and joint swelling.  Skin: Negative for color change and rash.  Neurological: Negative for dizziness, light-headedness and headaches.  Hematological: Negative for adenopathy. Does not bruise/bleed easily.  Psychiatric/Behavioral: Negative for agitation and dysphoric mood.       Objective:    Physical Exam  Constitutional: She is oriented to person, place, and time. She appears well-developed and well-nourished. No distress.  HENT:  Nose: Nose normal.  Mouth/Throat: Oropharynx is clear and moist.  Eyes:  Right eye exhibits no discharge. Left eye exhibits no discharge. No scleral icterus.  Neck: Neck supple. No thyromegaly present.  Cardiovascular: Normal rate and regular rhythm.   Pulmonary/Chest: Breath sounds normal. No accessory muscle usage. No tachypnea. No respiratory distress. She has no decreased breath sounds. She has no wheezes. She has no rhonchi. Right breast exhibits no inverted nipple, no mass, no nipple discharge and no tenderness (no axillary adenopathy). Left breast exhibits no inverted nipple, no mass, no nipple discharge and no tenderness (no axilarry adenopathy).  Abdominal: Soft. Bowel sounds are normal. There is no tenderness.  Musculoskeletal: She exhibits no edema or tenderness.  Lymphadenopathy:    She has no cervical adenopathy.  Neurological: She is alert and oriented to person, place, and time.  Skin: Skin is warm. No rash noted. No erythema.  Psychiatric: She has a normal mood and affect. Her behavior is normal.    BP 134/68 (BP Location: Left Arm, Patient Position: Sitting, Cuff Size: Normal)   Pulse 85   Temp 98.7 F (37.1 C) (Oral)   Resp 12   Ht 5\' 6"  (1.676 m)   Wt 221 lb 9.6 oz (100.5 kg)   LMP 08/27/1998   SpO2 98%   BMI 35.77 kg/m  Wt Readings from Last 3 Encounters:  03/21/17 221 lb 9.6 oz (100.5 kg)  08/29/16 220 lb (99.8 kg)  04/23/16 225 lb 6 oz (102.2 kg)     Lab Results  Component Value Date   WBC 5.3 03/21/2017   HGB 11.1 03/21/2017   HCT 34.5 03/21/2017   PLT 190 03/21/2017   GLUCOSE 96 03/21/2017   CHOL 184 03/21/2017   TRIG 86 03/21/2017   HDL 48 03/21/2017   LDLCALC 119 (H) 03/21/2017   ALT 50 (H) 03/21/2017   AST 47 (H) 03/21/2017   NA 142 03/21/2017   K 4.8 03/21/2017   CL 106 03/21/2017   CREATININE 0.73 03/21/2017   BUN 12 03/21/2017   CO2 24 03/21/2017   TSH 1.590 03/21/2017   HGBA1C 5.0 03/21/2017    Nm Hepato W/eject Fract  Result Date: 03/05/2017 CLINICAL DATA:  Chronic abdominal pain EXAM: NUCLEAR  MEDICINE HEPATOBILIARY IMAGING WITH GALLBLADDER EF TECHNIQUE: Sequential images of the abdomen were obtained out to 60 minutes following intravenous administration of radiopharmaceutical. After oral ingestion of Ensure, gallbladder ejection fraction was determined. At 60 min, normal ejection fraction is greater than 33%. RADIOPHARMACEUTICALS:  5.1 mCi Tc-73m  Choletec IV COMPARISON:  12/09/2016 CT scan ; hepatobiliary scan from 05/28/2013 FINDINGS: Satisfactory uptake of radiopharmaceutical from the blood pool. Biliary and gallbladder activity by 13 minutes. Bowel activity by 42 minutes. No significant symptoms after drinking the Ensure meal. Calculated gallbladder ejection fraction is 73%. (Normal gallbladder ejection fraction with Ensure is greater than 33%.) IMPRESSION: Normal hepatobiliary scan with gallbladder ejection fraction of 73%. Electronically Signed   By: Annitta Needs.D.  On: 03/05/2017 11:23       Assessment & Plan:   Problem List Items Addressed This Visit    Abdominal pain, epigastric    Discussed with her today.  Had EGD and colonoscopy in 2016.  Recent CT scan unrevealing.  HIDA negative.  Start protonix.  Keep f/u with GI.        Relevant Orders   CBC with Differential/Platelet (Completed)   Basic metabolic panel (Completed)   Abnormal liver function tests    Found to have fatty liver and mild splenomegaly on previous ultrasound.  Recent CT - no enlarged spleen.    Followed by GI.        Relevant Orders   Hepatic function panel (Completed)   Lipid panel (Completed)   BPPV (benign paroxysmal positional vertigo), left    S/p vestibular rehab.  Doing well.  Also seeing ENT.  S/p sinus surgery.        Family history of colonic polyps    Colonoscopy 08/29/14 - polyps.  Recommended f/u colonoscopy in five years.        GERD (gastroesophageal reflux disease)    EGD 2016 as outlined.  Keep f/u with GI.  Start protonix and see if helps with the epigastric/upper  symptoms.        Relevant Medications   pantoprazole (PROTONIX) 40 MG tablet   Health care maintenance    Physical today 03/21/17.  PAP 03/14/15 negative with negative HPV.  Mammogram 05/01/16 - Birads I.  Pt scheduled for f/u mammogram.        Thyroid fullness    Had thyroid ultrasound 2015.  Normal.  No palpable nodule.  Recheck tsh.  She has declined further evaluation.        Relevant Orders   Hemoglobin A1c (Completed)   TSH (Completed)   Urinary frequency - Primary    Check urinalysis.        Relevant Orders   Urinalysis, Routine w reflex microscopic (Completed)    Other Visit Diagnoses    Need for influenza vaccination       Relevant Orders   Flu Vaccine QUAD 6+ mos PF IM (Fluarix Quad PF) (Completed)       Dale Grayling, MD

## 2017-03-21 NOTE — Assessment & Plan Note (Signed)
Colonoscopy 08/29/14 - polyps.  Recommended f/u colonoscopy in five years.

## 2017-03-22 LAB — HEPATIC FUNCTION PANEL
ALBUMIN: 4.3 g/dL (ref 3.5–5.5)
ALK PHOS: 124 IU/L — AB (ref 39–117)
ALT: 50 IU/L — ABNORMAL HIGH (ref 0–32)
AST: 47 IU/L — AB (ref 0–40)
BILIRUBIN, DIRECT: 0.12 mg/dL (ref 0.00–0.40)
Bilirubin Total: 0.5 mg/dL (ref 0.0–1.2)
Total Protein: 7.1 g/dL (ref 6.0–8.5)

## 2017-03-22 LAB — URINALYSIS, ROUTINE W REFLEX MICROSCOPIC
BILIRUBIN UA: NEGATIVE
GLUCOSE, UA: NEGATIVE
KETONES UA: NEGATIVE
Nitrite, UA: NEGATIVE
PH UA: 5.5 (ref 5.0–7.5)
PROTEIN UA: NEGATIVE
RBC UA: NEGATIVE
SPEC GRAV UA: 1.022 (ref 1.005–1.030)
UUROB: 0.2 mg/dL (ref 0.2–1.0)

## 2017-03-22 LAB — CBC WITH DIFFERENTIAL/PLATELET
BASOS: 0 %
Basophils Absolute: 0 10*3/uL (ref 0.0–0.2)
EOS (ABSOLUTE): 0.1 10*3/uL (ref 0.0–0.4)
Eos: 3 %
HEMATOCRIT: 34.5 % (ref 34.0–46.6)
HEMOGLOBIN: 11.1 g/dL (ref 11.1–15.9)
Immature Grans (Abs): 0 10*3/uL (ref 0.0–0.1)
Immature Granulocytes: 0 %
LYMPHS: 27 %
Lymphocytes Absolute: 1.4 10*3/uL (ref 0.7–3.1)
MCH: 24.9 pg — AB (ref 26.6–33.0)
MCHC: 32.2 g/dL (ref 31.5–35.7)
MCV: 78 fL — AB (ref 79–97)
MONOCYTES: 5 %
Monocytes Absolute: 0.3 10*3/uL (ref 0.1–0.9)
NEUTROS ABS: 3.4 10*3/uL (ref 1.4–7.0)
Neutrophils: 65 %
Platelets: 190 10*3/uL (ref 150–379)
RBC: 4.45 x10E6/uL (ref 3.77–5.28)
RDW: 16 % — ABNORMAL HIGH (ref 12.3–15.4)
WBC: 5.3 10*3/uL (ref 3.4–10.8)

## 2017-03-22 LAB — BASIC METABOLIC PANEL
BUN / CREAT RATIO: 16 (ref 9–23)
BUN: 12 mg/dL (ref 6–24)
CHLORIDE: 106 mmol/L (ref 96–106)
CO2: 24 mmol/L (ref 20–29)
Calcium: 9.2 mg/dL (ref 8.7–10.2)
Creatinine, Ser: 0.73 mg/dL (ref 0.57–1.00)
GFR calc non Af Amer: 97 mL/min/{1.73_m2} (ref >59)
GFR, EST AFRICAN AMERICAN: 112 mL/min/{1.73_m2} (ref >59)
GLUCOSE: 96 mg/dL (ref 65–99)
Potassium: 4.8 mmol/L (ref 3.5–5.2)
SODIUM: 142 mmol/L (ref 134–144)

## 2017-03-22 LAB — MICROSCOPIC EXAMINATION: CASTS: NONE SEEN /LPF

## 2017-03-22 LAB — LIPID PANEL
CHOL/HDL RATIO: 3.8 ratio (ref 0.0–4.4)
Cholesterol, Total: 184 mg/dL (ref 100–199)
HDL: 48 mg/dL (ref >39)
LDL CALC: 119 mg/dL — AB (ref 0–99)
TRIGLYCERIDES: 86 mg/dL (ref 0–149)
VLDL Cholesterol Cal: 17 mg/dL (ref 5–40)

## 2017-03-22 LAB — HEMOGLOBIN A1C
Est. average glucose Bld gHb Est-mCnc: 97 mg/dL
Hgb A1c MFr Bld: 5 % (ref 4.8–5.6)

## 2017-03-22 LAB — TSH: TSH: 1.59 u[IU]/mL (ref 0.450–4.500)

## 2017-03-23 ENCOUNTER — Encounter: Payer: Self-pay | Admitting: Internal Medicine

## 2017-03-23 NOTE — Assessment & Plan Note (Addendum)
Found to have fatty liver and mild splenomegaly on previous ultrasound.  Recent CT - no enlarged spleen.    Followed by GI.

## 2017-03-23 NOTE — Assessment & Plan Note (Signed)
EGD 2016 as outlined.  Keep f/u with GI.  Start protonix and see if helps with the epigastric/upper symptoms.

## 2017-03-23 NOTE — Assessment & Plan Note (Signed)
Had thyroid ultrasound 2015.  Normal.  No palpable nodule.  Recheck tsh.  She has declined further evaluation.

## 2017-03-23 NOTE — Assessment & Plan Note (Signed)
Check urinalysis. 

## 2017-03-23 NOTE — Assessment & Plan Note (Signed)
S/p vestibular rehab.  Doing well.  Also seeing ENT.  S/p sinus surgery.

## 2017-03-23 NOTE — Assessment & Plan Note (Signed)
Discussed with her today.  Had EGD and colonoscopy in 2016.  Recent CT scan unrevealing.  HIDA negative.  Start protonix.  Keep f/u with GI.

## 2017-03-24 ENCOUNTER — Other Ambulatory Visit: Payer: Self-pay | Admitting: Internal Medicine

## 2017-03-24 DIAGNOSIS — R718 Other abnormality of red blood cells: Secondary | ICD-10-CM

## 2017-03-24 DIAGNOSIS — R945 Abnormal results of liver function studies: Secondary | ICD-10-CM

## 2017-03-24 DIAGNOSIS — R7989 Other specified abnormal findings of blood chemistry: Secondary | ICD-10-CM

## 2017-03-24 NOTE — Progress Notes (Signed)
Order placed for f/u labs.  

## 2017-03-25 ENCOUNTER — Telehealth: Payer: Self-pay | Admitting: *Deleted

## 2017-03-25 ENCOUNTER — Telehealth: Payer: Self-pay

## 2017-03-25 DIAGNOSIS — R899 Unspecified abnormal finding in specimens from other organs, systems and tissues: Secondary | ICD-10-CM

## 2017-03-25 NOTE — Telephone Encounter (Signed)
See app info

## 2017-03-25 NOTE — Telephone Encounter (Signed)
Lab results have been sent to GI this am

## 2017-03-25 NOTE — Telephone Encounter (Signed)
FYI Pt stated that [facility] at 1:30 for GI

## 2017-03-25 NOTE — Telephone Encounter (Signed)
Noted.  Make sure GI aware of pts labs and need for evaluation as outlined in result note.  Thanks.

## 2017-04-15 LAB — CBC WITH DIFFERENTIAL/PLATELET
BASOS ABS: 0 10*3/uL (ref 0.0–0.2)
Basos: 0 %
EOS (ABSOLUTE): 0.2 10*3/uL (ref 0.0–0.4)
Eos: 2 %
Hematocrit: 34.1 % (ref 34.0–46.6)
Hemoglobin: 10.8 g/dL — ABNORMAL LOW (ref 11.1–15.9)
IMMATURE GRANS (ABS): 0 10*3/uL (ref 0.0–0.1)
IMMATURE GRANULOCYTES: 0 %
Lymphocytes Absolute: 1.8 10*3/uL (ref 0.7–3.1)
Lymphs: 26 %
MCH: 25.3 pg — ABNORMAL LOW (ref 26.6–33.0)
MCHC: 31.7 g/dL (ref 31.5–35.7)
MCV: 80 fL (ref 79–97)
MONOCYTES: 7 %
Monocytes Absolute: 0.5 10*3/uL (ref 0.1–0.9)
NEUTROS PCT: 65 %
Neutrophils Absolute: 4.5 10*3/uL (ref 1.4–7.0)
PLATELETS: 206 10*3/uL (ref 150–379)
RBC: 4.27 x10E6/uL (ref 3.77–5.28)
RDW: 16 % — ABNORMAL HIGH (ref 12.3–15.4)
WBC: 7 10*3/uL (ref 3.4–10.8)

## 2017-04-15 LAB — HEPATIC FUNCTION PANEL
ALBUMIN: 4.2 g/dL (ref 3.5–5.5)
ALT: 44 IU/L — AB (ref 0–32)
AST: 39 IU/L (ref 0–40)
Alkaline Phosphatase: 125 IU/L — ABNORMAL HIGH (ref 39–117)
BILIRUBIN TOTAL: 0.4 mg/dL (ref 0.0–1.2)
Bilirubin, Direct: 0.15 mg/dL (ref 0.00–0.40)
TOTAL PROTEIN: 6.8 g/dL (ref 6.0–8.5)

## 2017-04-15 LAB — FERRITIN: FERRITIN: 24 ng/mL (ref 15–150)

## 2017-04-16 NOTE — Telephone Encounter (Signed)
Error

## 2017-04-23 ENCOUNTER — Other Ambulatory Visit: Payer: Self-pay | Admitting: Internal Medicine

## 2017-04-23 DIAGNOSIS — D649 Anemia, unspecified: Secondary | ICD-10-CM

## 2017-04-23 DIAGNOSIS — R7989 Other specified abnormal findings of blood chemistry: Secondary | ICD-10-CM

## 2017-04-23 DIAGNOSIS — R945 Abnormal results of liver function studies: Secondary | ICD-10-CM

## 2017-04-23 DIAGNOSIS — Z862 Personal history of diseases of the blood and blood-forming organs and certain disorders involving the immune mechanism: Secondary | ICD-10-CM | POA: Insufficient documentation

## 2017-04-23 NOTE — Progress Notes (Signed)
Orders placed for f/u labs.  

## 2017-04-25 ENCOUNTER — Telehealth: Payer: Self-pay | Admitting: Internal Medicine

## 2017-04-25 DIAGNOSIS — R945 Abnormal results of liver function studies: Secondary | ICD-10-CM

## 2017-04-25 DIAGNOSIS — D649 Anemia, unspecified: Secondary | ICD-10-CM

## 2017-04-25 DIAGNOSIS — R7989 Other specified abnormal findings of blood chemistry: Secondary | ICD-10-CM

## 2017-04-25 NOTE — Telephone Encounter (Signed)
Orders placed for [facility] labs.  See result note.

## 2017-05-01 ENCOUNTER — Inpatient Hospital Stay: Admission: RE | Admit: 2017-05-01 | Payer: Managed Care, Other (non HMO) | Source: Ambulatory Visit

## 2017-05-16 LAB — CBC WITH DIFFERENTIAL/PLATELET
BASOS: 0 %
Basophils Absolute: 0 10*3/uL (ref 0.0–0.2)
EOS (ABSOLUTE): 0.1 10*3/uL (ref 0.0–0.4)
EOS: 2 %
HEMATOCRIT: 34.2 % (ref 34.0–46.6)
Hemoglobin: 11.4 g/dL (ref 11.1–15.9)
IMMATURE GRANULOCYTES: 0 %
Immature Grans (Abs): 0 10*3/uL (ref 0.0–0.1)
LYMPHS ABS: 1.8 10*3/uL (ref 0.7–3.1)
Lymphs: 25 %
MCH: 25.4 pg — AB (ref 26.6–33.0)
MCHC: 33.3 g/dL (ref 31.5–35.7)
MCV: 76 fL — AB (ref 79–97)
MONOS ABS: 0.6 10*3/uL (ref 0.1–0.9)
Monocytes: 8 %
NEUTROS ABS: 4.9 10*3/uL (ref 1.4–7.0)
NEUTROS PCT: 65 %
Platelets: 212 10*3/uL (ref 150–379)
RBC: 4.48 x10E6/uL (ref 3.77–5.28)
RDW: 16.3 % — AB (ref 12.3–15.4)
WBC: 7.5 10*3/uL (ref 3.4–10.8)

## 2017-05-16 LAB — HEPATIC FUNCTION PANEL
ALT: 40 IU/L — AB (ref 0–32)
AST: 40 IU/L (ref 0–40)
Albumin: 4.3 g/dL (ref 3.5–5.5)
Alkaline Phosphatase: 132 IU/L — ABNORMAL HIGH (ref 39–117)
BILIRUBIN TOTAL: 0.4 mg/dL (ref 0.0–1.2)
Bilirubin, Direct: 0.12 mg/dL (ref 0.00–0.40)
Total Protein: 7.2 g/dL (ref 6.0–8.5)

## 2017-05-16 LAB — IRON AND TIBC
IRON: 51 ug/dL (ref 27–159)
Iron Saturation: 13 % — ABNORMAL LOW (ref 15–55)
TIBC: 399 ug/dL (ref 250–450)
UIBC: 348 ug/dL (ref 131–425)

## 2017-05-16 LAB — VITAMIN B12: VITAMIN B 12: 423 pg/mL (ref 232–1245)

## 2017-05-20 ENCOUNTER — Ambulatory Visit
Admission: RE | Admit: 2017-05-20 | Discharge: 2017-05-20 | Disposition: A | Discharge status: Home / Self Care | Payer: Managed Care, Other (non HMO) | Source: Ambulatory Visit | Attending: Internal Medicine | Admitting: Internal Medicine

## 2017-05-20 DIAGNOSIS — Z1231 Encounter for screening mammogram for malignant neoplasm of breast: Secondary | ICD-10-CM | POA: Insufficient documentation

## 2017-05-22 ENCOUNTER — Other Ambulatory Visit: Payer: Self-pay | Admitting: Internal Medicine

## 2017-05-22 DIAGNOSIS — R928 Other abnormal and inconclusive findings on diagnostic imaging of breast: Secondary | ICD-10-CM

## 2017-05-22 NOTE — Progress Notes (Signed)
Order placed for f/u left breast mammogram and ultrasound.   

## 2017-05-23 ENCOUNTER — Ambulatory Visit
Admission: RE | Admit: 2017-05-23 | Discharge: 2017-05-23 | Disposition: A | Discharge status: Home / Self Care | Payer: Managed Care, Other (non HMO) | Source: Ambulatory Visit | Attending: Internal Medicine | Admitting: Internal Medicine

## 2017-05-23 DIAGNOSIS — N6320 Unspecified lump in the left breast, unspecified quadrant: Secondary | ICD-10-CM | POA: Insufficient documentation

## 2017-05-23 DIAGNOSIS — R928 Other abnormal and inconclusive findings on diagnostic imaging of breast: Secondary | ICD-10-CM | POA: Diagnosis not present

## 2017-06-24 ENCOUNTER — Encounter: Payer: Self-pay | Admitting: Internal Medicine

## 2017-06-24 ENCOUNTER — Ambulatory Visit (INDEPENDENT_AMBULATORY_CARE_PROVIDER_SITE_OTHER): Payer: Managed Care, Other (non HMO) | Admitting: Internal Medicine

## 2017-06-24 VITALS — BP 130/77 | HR 85 | Temp 97.8°F | Resp 18 | Ht 66.0 in | Wt 224.1 lb

## 2017-06-24 DIAGNOSIS — R7989 Other specified abnormal findings of blood chemistry: Secondary | ICD-10-CM

## 2017-06-24 DIAGNOSIS — D649 Anemia, unspecified: Secondary | ICD-10-CM

## 2017-06-24 DIAGNOSIS — R945 Abnormal results of liver function studies: Secondary | ICD-10-CM

## 2017-06-24 DIAGNOSIS — K219 Gastro-esophageal reflux disease without esophagitis: Secondary | ICD-10-CM

## 2017-06-24 NOTE — Progress Notes (Signed)
Patient ID: Katie [name], female   DOB: 04/15/1966, 51 y.o.   MRN: [id number]   Subjective:    Patient ID: Katie [name], female    DOB: 01-18-1966, 51 y.o.   MRN: [id number]  HPI  Patient here for a scheduled follow up.  She reports she is doing relatively well.  Saw GI 04/22/17.  F/u for erosive gastritis/GERD/IBS.  Recommended continuing protonix and probiotics.  Regarding the anemia, no iron deficiency.  Also had f/u for abnormal liver function tests.  Recommended diet and exercise and weight loss.  Recommended repeat liver panel today.  Overall doing ok.  No chest pain.  No sob.  No abdominal pain.  Bowels stable.     Past Medical History:  Diagnosis Date  . Endometriosis   . Kidney stones   . Migraines   . Ovarian cyst   . Positional vertigo    PT helped   Past Surgical History:  Procedure Laterality Date  . ABDOMINAL HYSTERECTOMY  2000   left oophorectomy, Dr Katie [name]  . APPENDECTOMY  2000  . CESAREAN SECTION    . IMAGE GUIDED SINUS SURGERY Bilateral 08/29/2016   Procedure: IMAGE GUIDED SINUS SURGERY;  Surgeon: Katie Murders, MD;  Location: [facility];  Service: ENT;  Laterality: Bilateral;  NEED DISK GAVE DISK TO Katie [name] 1-23 KP  . RIGHT OOPHORECTOMY  2002  . SEPTOPLASTY Bilateral 08/29/2016   Procedure: SEPTOPLASTY;  Surgeon: Katie Murders, MD;  [name];  Service: ENT;  Laterality: Bilateral;  . SPHENOIDECTOMY Right 08/29/2016   Procedure: SPHENOIDECTOMY  with removal content right;  Surgeon: Katie Murders, MD;  [name];  Service: ENT;  Laterality: Right;  Marland Kitchen VAGINAL BIRTH AFTER CESAREAN SECTION     Family History  Problem Relation Age of Onset  . Hypertension Mother   . Diabetes Mother   . Thyroid cancer Mother   . Colon polyps Mother   . Thyroid cancer Maternal Aunt   . Colon polyps Maternal Aunt   . Breast cancer Neg Hx    Social History   Socioeconomic History  . Marital status: Married    Spouse name: None  .  Number of children: 2  . Years of education: None  . Highest education level: None  Social Needs  . Financial resource strain: None  . Food insecurity - worry: None  . Food insecurity - inability: None  . Transportation needs - medical: None  . Transportation needs - non-medical: None  Occupational History  . None  Tobacco Use  . Smoking status: Never Smoker  . Smokeless tobacco: Never Used  Substance and Sexual Activity  . Alcohol use: No    Alcohol/week: 0.0 oz  . Drug use: No  . Sexual activity: None  Other Topics Concern  . None  Social History Narrative  . None    Outpatient Encounter Medications as of 06/24/2017  Medication Sig  . pantoprazole (PROTONIX) 40 MG tablet Take one tablet 30 minutes before breakfast   No facility-administered encounter medications on file as of 06/24/2017.     Review of Systems  Constitutional: Negative for appetite change and unexpected weight change.  HENT: Negative for congestion and sinus pressure.   Respiratory: Negative for cough, chest tightness and shortness of breath.   Cardiovascular: Negative for chest pain, palpitations and leg swelling.  Gastrointestinal: Negative for abdominal pain, diarrhea, nausea and vomiting.  Genitourinary: Negative for difficulty urinating and dysuria.  Musculoskeletal: Negative for back pain and joint swelling.  Skin: Negative for color change and rash.  Neurological: Negative for dizziness, light-headedness and headaches.  Psychiatric/Behavioral: Negative for agitation and dysphoric mood.       Objective:    Physical Exam  Constitutional: She appears well-developed and well-nourished. No distress.  HENT:  Nose: Nose normal.  Mouth/Throat: Oropharynx is clear and moist.  Neck: Neck supple. No thyromegaly present.  Cardiovascular: Normal rate and regular rhythm.  Pulmonary/Chest: Breath sounds normal. No respiratory distress. She has no wheezes.  Abdominal: Soft. Bowel sounds are normal. There  is no tenderness.  Musculoskeletal: She exhibits no edema or tenderness.  Lymphadenopathy:    She has no cervical adenopathy.  Skin: No rash noted. No erythema.  Psychiatric: She has a normal mood and affect. Her behavior is normal.    BP 130/77   Pulse 85   Temp 97.8 F (36.6 C)   Resp 18   Ht 5\' 6"  (1.676 m)   Wt 224 lb 2 oz (101.7 kg)   LMP 08/27/1998   SpO2 96%   BMI 36.17 kg/m  Wt Readings from Last 3 Encounters:  06/24/17 224 lb 2 oz (101.7 kg)  03/21/17 221 lb 9.6 oz (100.5 kg)  08/29/16 220 lb (99.8 kg)     Lab Results  Component Value Date   WBC 7.5 05/14/2017   HGB 11.4 05/14/2017   HCT 34.2 05/14/2017   PLT 212 05/14/2017   GLUCOSE 96 03/21/2017   CHOL 184 03/21/2017   TRIG 86 03/21/2017   HDL 48 03/21/2017   LDLCALC 119 (H) 03/21/2017   ALT 41 (H) 06/24/2017   AST 34 06/24/2017   NA 142 03/21/2017   K 4.8 03/21/2017   CL 106 03/21/2017   CREATININE 0.73 03/21/2017   BUN 12 03/21/2017   CO2 24 03/21/2017   TSH 1.590 03/21/2017   HGBA1C 5.0 03/21/2017    [place] Breast Ltd Uni Left Inc Axilla  Result Date: 05/23/2017 CLINICAL DATA:  Callback from screening mammogram for possible mass left breast EXAM: 2D DIGITAL DIAGNOSTIC LEFT MAMMOGRAM WITH ADJUNCT TOMO ULTRASOUND LEFT BREAST COMPARISON:  Previous exam(s). ACR Breast Density Category b: There are scattered areas of fibroglandular density. FINDINGS: Spot compression CC and MLO views of the left breast are submitted. Previously questioned mass in the retroareolar slight lateral left breast persists on additional views. Targeted ultrasound is performed, showing a small cluster of cysts measuring 0.5 cm in the left breast retroareolar 1 o'clock position correlating to the mammographic finding. IMPRESSION: Benign findings. RECOMMENDATION: Routine screening mammogram back on schedule. I have discussed the findings and recommendations with the patient. Results were also provided in writing at the conclusion of the  visit. If applicable, a reminder letter will be sent to the patient regarding the next appointment. BI-RADS CATEGORY  2: Benign. Electronically Signed   By: Sherian ReinWei-Chen  Lin M.D.   On: 05/23/2017 13:23   Mm Diag Breast Tomo Uni Left  Result Date: 05/23/2017 CLINICAL DATA:  Callback from screening mammogram for possible mass left breast EXAM: 2D DIGITAL DIAGNOSTIC LEFT MAMMOGRAM WITH ADJUNCT TOMO ULTRASOUND LEFT BREAST COMPARISON:  Previous exam(s). ACR Breast Density Category b: There are scattered areas of fibroglandular density. FINDINGS: Spot compression CC and MLO views of the left breast are submitted. Previously questioned mass in the retroareolar slight lateral left breast persists on additional views. Targeted ultrasound is performed, showing a small cluster of cysts measuring 0.5 cm in the left breast retroareolar 1 o'clock position correlating to the mammographic finding. IMPRESSION: Benign findings.  RECOMMENDATION: Routine screening mammogram back on schedule. I have discussed the findings and recommendations with the patient. Results were also provided in writing at the conclusion of the visit. If applicable, a reminder letter will be sent to the patient regarding the next appointment. BI-RADS CATEGORY  2: Benign. Electronically Signed   By: Sherian ReinWei-Chen  Lin M.D.   On: 05/23/2017 13:22       Assessment & Plan:   Problem List Items Addressed This Visit    Abnormal liver function tests - Primary    Found to have fatty liver.  Discussed diet, exercise and weight loss.  Continue f/u with GI.  Recheck liver panel today.        Relevant Orders   Hepatic function panel (Completed)   Anemia    Saw GI.  Had previous endoscopies.  Last hgb 11.4.  Follow cbc.       GERD (gastroesophageal reflux disease)    On protonix.  Stable.  Seeing GI.            Dale DurhamSCOTT, Yolanda Huffstetler, MD

## 2017-06-26 LAB — HEPATIC FUNCTION PANEL
ALBUMIN: 4.4 g/dL (ref 3.5–5.5)
ALT: 41 IU/L — ABNORMAL HIGH (ref 0–32)
AST: 34 IU/L (ref 0–40)
Alkaline Phosphatase: 130 IU/L — ABNORMAL HIGH (ref 39–117)
Bilirubin Total: 0.4 mg/dL (ref 0.0–1.2)
Bilirubin, Direct: 0.13 mg/dL (ref 0.00–0.40)
Total Protein: 7.2 g/dL (ref 6.0–8.5)

## 2017-06-28 ENCOUNTER — Encounter: Payer: Self-pay | Admitting: Internal Medicine

## 2017-06-28 NOTE — Assessment & Plan Note (Signed)
Found to have fatty liver.  Discussed diet, exercise and weight loss.  Continue f/u with GI.  Recheck liver panel today.

## 2017-06-28 NOTE — Assessment & Plan Note (Signed)
Saw GI.  Had previous endoscopies.  Last hgb 11.4.  Follow cbc.

## 2017-06-28 NOTE — Assessment & Plan Note (Signed)
On protonix.  Stable.  Seeing GI.

## 2017-07-31 ENCOUNTER — Ambulatory Visit: Payer: Managed Care, Other (non HMO) | Admitting: Internal Medicine

## 2017-07-31 ENCOUNTER — Encounter: Payer: Self-pay | Admitting: Internal Medicine

## 2017-07-31 VITALS — BP 128/82 | HR 74 | Temp 98.3°F | Wt 223.8 lb

## 2017-07-31 DIAGNOSIS — J019 Acute sinusitis, unspecified: Secondary | ICD-10-CM | POA: Diagnosis not present

## 2017-07-31 MED ORDER — CEFUROXIME AXETIL 500 MG PO TABS: 500.0000 mg | ORAL_TABLET | Freq: Two times a day (BID) | ORAL | 1 refills | Status: DC

## 2017-07-31 NOTE — Assessment & Plan Note (Signed)
Sick for almost 3 weeks Recurrent sinusitis in the past Likely bacterial Multiple allergies--has tolerated cephalosporins so will try ceftin

## 2017-07-31 NOTE — Progress Notes (Signed)
Subjective:    Patient ID: Katie [name], female    DOB: 06-18-1966, 52 y.o.   MRN: [id number]  HPI Here due to sinus symptoms  Has had a cold since 12/20 Has tried a number of OTC meds without much help Hasn't improved Now with dry hacking cough--worst at night Lots of maxillary pressure and post nasal drip  No fever Some SOB when climbing stair---no asthma history No wheezing Some sore throat and voice off--started with this and some persistence Some popping in right ear with blowing nose  Sinus surgery last February/septal repair Irving Shows(Juengal) Uses nasacort regularly  Current Outpatient Medications on File Prior to Visit  Medication Sig Dispense Refill  . pantoprazole (PROTONIX) 40 MG tablet Take one tablet 30 minutes before breakfast 30 tablet 2   No current facility-administered medications on file prior to visit.     Allergies  Allergen Reactions  . Doxycycline Diarrhea and Nausea And Vomiting       . Levaquin [Levofloxacin]     Joint pain  . Shellfish Allergy Nausea And Vomiting  . Amoxicillin Rash  . Codeine Other (See Comments)    Makes her feel disoriented  . Contrast Media [Iodinated Diagnostic Agents] Rash  . Penicillins Rash    Past Medical History:  Diagnosis Date  . Endometriosis   . Kidney stones   . Migraines   . Ovarian cyst   . Positional vertigo    PT helped    Past Surgical History:  Procedure Laterality Date  . ABDOMINAL HYSTERECTOMY  2000   left oophorectomy, Dr Mia CreekBerliner  . APPENDECTOMY  2000  . CESAREAN SECTION    . IMAGE GUIDED SINUS SURGERY Bilateral 08/29/2016   Procedure: IMAGE GUIDED SINUS SURGERY;  Surgeon: Vernie MurdersPaul Juengel, MD;  [facility];  Service: ENT;  Laterality: Bilateral;  NEED DISK GAVE DISK TO CECE 1-23 KP  . RIGHT OOPHORECTOMY  2002  . SEPTOPLASTY Bilateral 08/29/2016   Procedure: SEPTOPLASTY;  Surgeon: Vernie MurdersPaul Juengel, MD;  [facility];  Service: ENT;  Laterality: Bilateral;  .  SPHENOIDECTOMY Right 08/29/2016   Procedure: SPHENOIDECTOMY  with removal content right;  Surgeon: Vernie MurdersPaul Juengel, MD;  [facility];  Service: ENT;  Laterality: Right;  Marland Kitchen. VAGINAL BIRTH AFTER CESAREAN SECTION      Family History  Problem Relation Age of Onset  . Hypertension Mother   . Diabetes Mother   . Thyroid cancer Mother   . Colon polyps Mother   . Thyroid cancer Maternal Aunt   . Colon polyps Maternal Aunt   . Breast cancer Neg Hx     Social History   Socioeconomic History  . Marital status: Married    Spouse name: Not on file  . Number of children: 2  . Years of education: Not on file  . Highest education level: Not on file  Social Needs  . Financial resource strain: Not on file  . Food insecurity - worry: Not on file  . Food insecurity - inability: Not on file  . Transportation needs - medical: Not on file  . Transportation needs - non-medical: Not on file  Occupational History  . Not on file  Tobacco Use  . Smoking status: Never Smoker  . Smokeless tobacco: Never Used  Substance and Sexual Activity  . Alcohol use: No    Alcohol/week: 0.0 oz  . Drug use: No  . Sexual activity: Not on file  Other Topics Concern  . Not on file  Social  History Narrative  . Not on file   Review of Systems  No rash No vomiting or diarrhea Appetite is okay Not sleeping great--?from the meds     Objective:   Physical Exam  Constitutional: She appears well-developed. No distress.  HENT:  Mouth/Throat: Oropharynx is clear and moist. No oropharyngeal exudate.  Maxillary tenderness Mild nasal inflammation TMs normal  Neck: No thyromegaly present.  Pulmonary/Chest: Effort normal and breath sounds normal. No respiratory distress. She has no wheezes. She has no rales.  Lymphadenopathy:    She has no cervical adenopathy.          Assessment & Plan:

## 2017-09-30 ENCOUNTER — Emergency Department
Admission: EM | Admit: 2017-09-30 | Discharge: 2017-09-30 | Disposition: A | Discharge status: Home / Self Care | Payer: Managed Care, Other (non HMO) | Attending: Emergency Medicine | Admitting: Emergency Medicine

## 2017-09-30 ENCOUNTER — Emergency Department: Payer: Managed Care, Other (non HMO)

## 2017-09-30 ENCOUNTER — Other Ambulatory Visit: Payer: Self-pay

## 2017-09-30 ENCOUNTER — Encounter: Payer: Self-pay | Admitting: Emergency Medicine

## 2017-09-30 DIAGNOSIS — R1013 Epigastric pain: Secondary | ICD-10-CM | POA: Insufficient documentation

## 2017-09-30 DIAGNOSIS — R1012 Left upper quadrant pain: Secondary | ICD-10-CM | POA: Diagnosis not present

## 2017-09-30 DIAGNOSIS — R1010 Upper abdominal pain, unspecified: Secondary | ICD-10-CM

## 2017-09-30 LAB — COMPREHENSIVE METABOLIC PANEL
ALBUMIN: 4.2 g/dL (ref 3.5–5.0)
ALK PHOS: 129 U/L — AB (ref 38–126)
ALT: 38 U/L (ref 14–54)
AST: 36 U/L (ref 15–41)
Anion gap: 8 (ref 5–15)
BUN: 14 mg/dL (ref 6–20)
CALCIUM: 9.4 mg/dL (ref 8.9–10.3)
CO2: 25 mmol/L (ref 22–32)
CREATININE: 0.61 mg/dL (ref 0.44–1.00)
Chloride: 105 mmol/L (ref 101–111)
GFR calc non Af Amer: >60 mL/min (ref >60)
GLUCOSE: 118 mg/dL — AB (ref 65–99)
Potassium: 4 mmol/L (ref 3.5–5.1)
SODIUM: 138 mmol/L (ref 135–145)
Total Bilirubin: 0.8 mg/dL (ref 0.3–1.2)
Total Protein: 7.9 g/dL (ref 6.5–8.1)

## 2017-09-30 LAB — URINALYSIS, COMPLETE (UACMP) WITH MICROSCOPIC
Bacteria, UA: NONE SEEN
Bilirubin Urine: NEGATIVE
GLUCOSE, UA: NEGATIVE mg/dL
Ketones, ur: NEGATIVE mg/dL
Leukocytes, UA: NEGATIVE
Nitrite: NEGATIVE
PH: 6 (ref 5.0–8.0)
PROTEIN: NEGATIVE mg/dL
Specific Gravity, Urine: 1.017 (ref 1.005–1.030)

## 2017-09-30 LAB — CBC
HCT: 40 % (ref 35.0–47.0)
Hemoglobin: 13.2 g/dL (ref 12.0–16.0)
MCH: 27.1 pg (ref 26.0–34.0)
MCHC: 33 g/dL (ref 32.0–36.0)
MCV: 82.1 fL (ref 80.0–100.0)
PLATELETS: 183 10*3/uL (ref 150–440)
RBC: 4.87 MIL/uL (ref 3.80–5.20)
RDW: 14.7 % — ABNORMAL HIGH (ref 11.5–14.5)
WBC: 6.7 10*3/uL (ref 3.6–11.0)

## 2017-09-30 LAB — LIPASE, BLOOD: Lipase: 23 U/L (ref 11–51)

## 2017-09-30 LAB — TROPONIN I: Troponin I: <0.03 ng/mL (ref <0.03)

## 2017-09-30 MED ORDER — SUCRALFATE 1 G PO TABS: 1.0000 g | ORAL_TABLET | Freq: Four times a day (QID) | ORAL | 1 refills | Status: DC

## 2017-09-30 MED ORDER — ONDANSETRON 4 MG PO TBDP
4.0000 mg | ORAL_TABLET | Freq: Once | ORAL | Status: AC
Start: 2017-09-30 — End: 2017-09-30
  Administered 2017-09-30: 4 mg via ORAL
  Filled 2017-09-30: qty 1

## 2017-09-30 MED ORDER — GI COCKTAIL ~~LOC~~
30.0000 mL | Freq: Once | ORAL | Status: AC
  Administered 2017-09-30: 30 mL via ORAL
  Filled 2017-09-30: qty 30

## 2017-09-30 NOTE — ED Triage Notes (Signed)
Says upper mid abd pain --230am took tums which helped.  Then started having loose stools and feeling shakey.

## 2017-09-30 NOTE — ED Notes (Signed)
Pt ambulatory to POV. VSS. NAD. Discharge instructions, RX and follow up discussed with pt. All questions answered.

## 2017-09-30 NOTE — ED Provider Notes (Signed)
[facility] Emergency Department Provider Note       Time seen: ----------------------------------------- 9:48 AM on 09/30/2017 -----------------------------------------   I have reviewed the triage vital signs and the nursing notes.  HISTORY   Chief Complaint Abdominal Pain    HPI Katie [name] is a 52 y.o. female with a history of endometriosis, kidney stones, migraines, IBS who presents to the ED for epigastric pain and left upper quadrant pain that started around 2:30 AM.  Patient took Tums which helped somewhat.  She does have a history of IBS and reports frequent abdominal pain.  She also started having loose stools but no overt diarrhea.  She does feel shaky and weak as well.  He denies fevers, chills or other complaints.  Past Medical History:  Diagnosis Date  . Endometriosis   . Kidney stones   . Migraines   . Ovarian cyst   . Positional vertigo    PT helped    Patient Active Problem List   Diagnosis Date Noted  . Anemia 04/23/2017  . BPPV (benign paroxysmal positional vertigo), left 04/23/2016  . Chest pressure 07/02/2015  . Health care maintenance 03/19/2015  . GERD (gastroesophageal reflux disease) 02/12/2015  . History of colonic polyps 02/12/2015  . Urinary frequency 01/29/2015  . Abdominal pain, epigastric 06/23/2014  . Pain in the chest 06/23/2014  . Sinusitis, acute 05/16/2014  . Abnormal liver function tests 03/06/2014  . Thyroid fullness 03/06/2014  . Menopausal symptoms 03/04/2013  . Edema of both legs 02/03/2013  . Sinusitis 11/02/2012  . Family history of colonic polyps 08/27/2012  . Migraine headache 08/27/2012  . Nephrolithiasis 08/27/2012  . Endometriosis 08/27/2012    Past Surgical History:  Procedure Laterality Date  . ABDOMINAL HYSTERECTOMY  2000   left oophorectomy, Dr Mia CreekBerliner  . APPENDECTOMY  2000  . CESAREAN SECTION    . IMAGE GUIDED SINUS SURGERY Bilateral 08/29/2016   Procedure: IMAGE GUIDED SINUS  SURGERY;  Surgeon: Vernie MurdersPaul Juengel, MD;  [facility];  Service: ENT;  Laterality: Bilateral;  NEED DISK GAVE DISK TO CECE 1-23 KP  . RIGHT OOPHORECTOMY  2002  . SEPTOPLASTY Bilateral 08/29/2016   Procedure: SEPTOPLASTY;  Surgeon: Vernie MurdersPaul Juengel, MD;  [facility];  Service: ENT;  Laterality: Bilateral;  . SPHENOIDECTOMY Right 08/29/2016   Procedure: SPHENOIDECTOMY  with removal content right;  Surgeon: Vernie MurdersPaul Juengel, MD;  [facility];  Service: ENT;  Laterality: Right;  Marland Kitchen. VAGINAL BIRTH AFTER CESAREAN SECTION      Allergies Doxycycline; Levaquin [levofloxacin]; Shellfish allergy; Amoxicillin; Codeine; Contrast media [iodinated diagnostic agents]; and Penicillins  Social History Social History   Tobacco Use  . Smoking status: Never Smoker  . Smokeless tobacco: Never Used  Substance Use Topics  . Alcohol use: No    Alcohol/week: 0.0 oz  . Drug use: No    Review of Systems Constitutional: Negative for fever. Cardiovascular: Negative for chest pain. Respiratory: Negative for shortness of breath. Gastrointestinal: Positive for abdominal pain Musculoskeletal: Negative for back pain. Skin: Negative for rash. Neurological: Negative for headaches, positive for generalized weakness  All systems negative/normal/unremarkable except as stated in the HPI  ____________________________________________   PHYSICAL EXAM:  VITAL SIGNS: ED Triage Vitals [09/30/17 0818]  Enc Vitals Group     BP (!) 170/97     Pulse Rate (!) 104     Resp 18     Temp 98.4 F (36.9 C)     Temp Source Oral  SpO2 99 %     Weight 223 lb (101.2 kg)     Height 5\' 7"  (1.702 m)     Head Circumference      Peak Flow      Pain Score      Pain Loc      Pain Edu?      Excl. in GC?    Constitutional: Alert and oriented. Well appearing and in no distress. Eyes: Conjunctivae are normal. Normal extraocular movements. ENT   Head: Normocephalic and atraumatic.    Nose: No congestion/rhinnorhea.   Mouth/Throat: Mucous membranes are moist.   Neck: No stridor. Cardiovascular: Normal rate, regular rhythm. No murmurs, rubs, or gallops. Respiratory: Normal respiratory effort without tachypnea nor retractions. Breath sounds are clear and equal bilaterally. No wheezes/rales/rhonchi. Gastrointestinal: Mild epigastric tenderness, no rebound or guarding.  Normal bowel sounds. Musculoskeletal: Nontender with normal range of motion in extremities. No lower extremity tenderness nor edema. Neurologic:  Normal speech and language. No gross focal neurologic deficits are appreciated.  Skin:  Skin is warm, dry and intact. No rash noted. Psychiatric: Mood and affect are normal. Speech and behavior are normal.  ____________________________________________  EKG: Interpreted by me.  Sinus rhythm rate 90 bpm, normal PR interval, normal QRS, normal QT  ____________________________________________  ED COURSE:  As part of my medical decision making, I reviewed the following data within the electronic MEDICAL RECORD NUMBER History obtained from family if available, nursing notes, old chart and ekg, as well as notes from prior ED visits. Patient presented for abdominal pain, we will assess with labs and imaging as indicated at this time.   Procedures ____________________________________________   LABS (pertinent positives/negatives)  Labs Reviewed  COMPREHENSIVE METABOLIC PANEL - Abnormal; Notable for the following components:      Result Value   Glucose, Bld 118 (*)    Alkaline Phosphatase 129 (*)    All other components within normal limits  CBC - Abnormal; Notable for the following components:   RDW 14.7 (*)    All other components within normal limits  URINALYSIS, COMPLETE (UACMP) WITH MICROSCOPIC - Abnormal; Notable for the following components:   Color, Urine YELLOW (*)    APPearance CLEAR (*)    Hgb urine dipstick MODERATE (*)    Squamous Epithelial / LPF  0-5 (*)    All other components within normal limits  LIPASE, BLOOD  TROPONIN I  POC URINE PREG, ED    RADIOLOGY  Abdomen 2 view  IMPRESSION: No evidence of bowel distention or free air. Stool noted throughout the colon. Mild thoracolumbar spine degenerative change. ____________________________________________  DIFFERENTIAL DIAGNOSIS   GERD, peptic ulcer disease, IBS, constipation  FINAL ASSESSMENT AND PLAN  Epigastric pain   Plan: The patient had presented for epigastric pain. Patient's labs not reveal any focal abnormality. Patient's imaging was also negative.  She will be encouraged to take antacids as scheduled.  I will also write for Carafate.  She is cleared for outpatient follow-up.   Ulice Dash, MD   Note: This note was generated in part or whole with voice recognition software. Voice recognition is usually quite accurate but there are transcription errors that can and very often do occur. I apologize for any typographical errors that were not detected and corrected.     Emily Filbert, MD 09/30/17 [phone]

## 2017-11-12 ENCOUNTER — Telehealth: Payer: Self-pay

## 2017-11-12 NOTE — Telephone Encounter (Signed)
error 

## 2017-11-12 NOTE — Telephone Encounter (Signed)
Received referral for EUS for duodenal bulb submucosal nodule. Voicemail mail left with Ms. Katie [name] to return call for scheduling. Oncology Nurse Navigator Documentation  Navigator Location: [facility] (11/12/17 1600)   )Navigator Encounter Type: Telephone (11/12/17 1600) Telephone: Outgoing Call (11/12/17 1600)                       Barriers/Navigation Needs: Coordination of Care (11/12/17 1600)   Interventions: Coordination of Care (11/12/17 1600)   Coordination of Care: EUS (11/12/17 1600)                  Time Spent with Patient: 15 (11/12/17 1600)

## 2017-11-14 ENCOUNTER — Telehealth: Payer: Self-pay

## 2017-11-14 NOTE — Telephone Encounter (Signed)
EUS has been scheduled for 5/16 with Dr. Shana ChuteBurbridge. Went over instructions and copy mailed to home address. Provided contact information for any future questions. Denies taking any anticoagulants, denies diabetes.  INSTRUCTIONS FOR ENDOSCOPIC ULTRASOUND -Your procedure has been scheduled for May 16th with Dr. [facility]uteBurbridge at [facility]. -The hospital may contact you to pre-register over the phone.  -To get your scheduled arrival time, please call the Endoscopy unit at  [phone] between 1-3 p.m. on:  May 15th    -ON THE DAY OF YOU PROCEDURE:   1. If you are scheduled for a morning procedure, nothing to drink after midnight  -If you are scheduled for an afternoon procedure, you may have clear liquids until 5 hours prior  to the procedure but no carbonated drinks or broth  2. NO FOOD THE DAY OF YOUR PROCEDURE  3. You may take your heart, seizure, blood pressure, Parkinson's or breathing medications at  6am with just enough water to get your pills down  4. Do not take any oral Diabetic medications the morning of your procedure.  5. If you are a diabetic and are using insulin, please notify your prescribing physician of this  procedure as your dose may need to be altered related to not being able to eat or drink.   5. Do not take vitamins, iron, or fish oil for 5 days before your procedure     -On the day of your procedure, come to the [facility] Admitting/Registration desk (First desk on the right) at the scheduled arrival time. You MUST have someone drive you home from your procedure. You must have a responsible adult with a valid driver's license who is on site throughout your entire procedure and who can stay with you for several hours after your procedure. You may not go home alone in a taxi, shuttle [place] or bus, as the drivers will not be responsible for you.  --If you have any questions please call me at the above contact  Oncology Nurse Navigator  Documentation  Navigator Location: [facility] (11/14/17 1400)   )Navigator Encounter Type: Telephone (11/14/17 1400) Telephone: Kathrin Pennerutgoing Call;Appt Confirmation/Clarification (11/14/17 1400)                       Barriers/Navigation Needs: Coordination of Care (11/14/17 1400)   Interventions: Coordination of Care (11/14/17 1400)   Coordination of Care: EUS (11/14/17 1400)                  Time Spent with Patient: 30 (11/14/17 1400)

## 2017-12-03 ENCOUNTER — Encounter: Payer: Self-pay | Admitting: *Deleted

## 2017-12-04 ENCOUNTER — Ambulatory Visit: Payer: Managed Care, Other (non HMO) | Admitting: Anesthesiology

## 2017-12-04 ENCOUNTER — Encounter: Payer: Self-pay | Admitting: *Deleted

## 2017-12-04 ENCOUNTER — Ambulatory Visit
Admission: RE | Admit: 2017-12-04 | Discharge: 2017-12-04 | Disposition: A | Discharge status: Home / Self Care | Payer: Managed Care, Other (non HMO) | Source: Ambulatory Visit | Attending: Internal Medicine | Admitting: Internal Medicine

## 2017-12-04 ENCOUNTER — Encounter
Admission: RE | Disposition: A | Discharge status: Home / Self Care | Payer: Self-pay | Source: Ambulatory Visit | Attending: Internal Medicine

## 2017-12-04 DIAGNOSIS — K219 Gastro-esophageal reflux disease without esophagitis: Secondary | ICD-10-CM | POA: Insufficient documentation

## 2017-12-04 DIAGNOSIS — K589 Irritable bowel syndrome without diarrhea: Secondary | ICD-10-CM | POA: Diagnosis not present

## 2017-12-04 DIAGNOSIS — K279 Peptic ulcer, site unspecified, unspecified as acute or chronic, without hemorrhage or perforation: Secondary | ICD-10-CM | POA: Diagnosis not present

## 2017-12-04 DIAGNOSIS — K3189 Other diseases of stomach and duodenum: Secondary | ICD-10-CM | POA: Diagnosis not present

## 2017-12-04 DIAGNOSIS — K760 Fatty (change of) liver, not elsewhere classified: Secondary | ICD-10-CM | POA: Diagnosis not present

## 2017-12-04 DIAGNOSIS — Z87442 Personal history of urinary calculi: Secondary | ICD-10-CM | POA: Insufficient documentation

## 2017-12-04 DIAGNOSIS — G43909 Migraine, unspecified, not intractable, without status migrainosus: Secondary | ICD-10-CM | POA: Insufficient documentation

## 2017-12-04 DIAGNOSIS — N83209 Unspecified ovarian cyst, unspecified side: Secondary | ICD-10-CM | POA: Diagnosis not present

## 2017-12-04 DIAGNOSIS — K317 Polyp of stomach and duodenum: Secondary | ICD-10-CM | POA: Diagnosis present

## 2017-12-04 HISTORY — PX: EUS: SHX5427

## 2017-12-04 HISTORY — DX: Anemia, unspecified: D64.9

## 2017-12-04 HISTORY — DX: Other gastritis without bleeding: K29.60

## 2017-12-04 HISTORY — DX: Personal history of urinary calculi: Z87.442

## 2017-12-04 HISTORY — DX: Polyp of colon: K63.5

## 2017-12-04 HISTORY — DX: Irritable bowel syndrome, unspecified: K58.9

## 2017-12-04 HISTORY — DX: Fatty (change of) liver, not elsewhere classified: K76.0

## 2017-12-04 HISTORY — DX: Gastro-esophageal reflux disease without esophagitis: K21.9

## 2017-12-04 SURGERY — ESOPHAGEAL ENDOSCOPIC ULTRASOUND (EUS) RADIAL
Anesthesia: General

## 2017-12-04 MED ORDER — FENTANYL CITRATE (PF) 100 MCG/2ML IJ SOLN
INTRAMUSCULAR | Status: DC | PRN
  Administered 2017-12-04 (×2): 25 ug via INTRAVENOUS
  Administered 2017-12-04: 50 ug via INTRAVENOUS

## 2017-12-04 MED ORDER — FENTANYL CITRATE (PF) 100 MCG/2ML IJ SOLN
INTRAMUSCULAR | Status: AC
  Filled 2017-12-04: qty 2

## 2017-12-04 MED ORDER — PROPOFOL 10 MG/ML IV BOLUS
INTRAVENOUS | Status: DC | PRN
  Administered 2017-12-04: 50 mg via INTRAVENOUS
  Administered 2017-12-04: 100 mg via INTRAVENOUS

## 2017-12-04 MED ORDER — PROPOFOL 500 MG/50ML IV EMUL
INTRAVENOUS | Status: DC | PRN
  Administered 2017-12-04: 100 ug/kg/min via INTRAVENOUS

## 2017-12-04 MED ORDER — SODIUM CHLORIDE 0.9 % IV SOLN
INTRAVENOUS | Status: DC
  Administered 2017-12-04: 13:00:00 via INTRAVENOUS

## 2017-12-04 MED ORDER — PROPOFOL 10 MG/ML IV BOLUS
INTRAVENOUS | Status: AC
  Filled 2017-12-04: qty 20

## 2017-12-04 MED ORDER — LIDOCAINE HCL (CARDIAC) PF 100 MG/5ML IV SOSY
PREFILLED_SYRINGE | INTRAVENOUS | Status: DC | PRN
  Administered 2017-12-04: 80 mg via INTRAVENOUS

## 2017-12-04 MED ORDER — PROPOFOL 10 MG/ML IV BOLUS
INTRAVENOUS | Status: AC
  Filled 2017-12-04: qty 40

## 2017-12-04 NOTE — Anesthesia Preprocedure Evaluation (Signed)
Anesthesia Evaluation  Patient identified by MRN, date of birth, ID band Patient awake    Reviewed: Allergy & Precautions, H&P , NPO status , Patient's Chart, lab work & pertinent test results, reviewed documented beta blocker date and time   History of Anesthesia Complications Negative for: history of anesthetic complications  Airway Mallampati: II  TM Distance: >3 FB Neck ROM: full    Dental  (+) Dental Advidsory Given, Teeth Intact   Pulmonary neg pulmonary ROS,           Cardiovascular Exercise Tolerance: Good negative cardio ROS       Neuro/Psych  Headaches, negative psych ROS   GI/Hepatic PUD, GERD  Controlled,NAFLD   Endo/Other  negative endocrine ROS  Renal/GU Renal disease (kidney stones)  negative genitourinary   Musculoskeletal   Abdominal   Peds  Hematology negative hematology ROS (+)   Anesthesia Other Findings Past Medical History: No date: Anemia No date: Endometriosis No date: Erosive gastritis No date: GERD (gastroesophageal reflux disease) No date: History of kidney stones No date: Hyperplastic colon polyp No date: Irritable bowel syndrome No date: Kidney stones No date: Migraines No date: Nonalcoholic fatty liver disease No date: Ovarian cyst No date: Positional vertigo     Comment:  PT helped   Reproductive/Obstetrics negative OB ROS                             Anesthesia Physical  Anesthesia Plan  ASA: II  Anesthesia Plan: General   Post-op Pain Management:    Induction: Intravenous  PONV Risk Score and Plan: 3 and Propofol infusion  Airway Management Planned: Nasal Cannula and Natural Airway  Additional Equipment:   Intra-op Plan:   Post-operative Plan:   Informed Consent: I have reviewed the patients History and Physical, chart, labs and discussed the procedure including the risks, benefits and alternatives for the proposed anesthesia  with the patient or authorized representative who has indicated his/her understanding and acceptance.   Dental Advisory Given  Plan Discussed with: CRNA  Anesthesia Plan Comments:         Anesthesia Quick Evaluation

## 2017-12-04 NOTE — Anesthesia Postprocedure Evaluation (Signed)
Anesthesia Post Note  Patient: Katie [name]  Procedure(s) Performed: ESOPHAGEAL ENDOSCOPIC ULTRASOUND (EUS) RADIAL (N/A )  Patient location during evaluation: Endoscopy Anesthesia Type: General Level of consciousness: awake and alert Pain management: pain level controlled Vital Signs Assessment: post-procedure vital signs reviewed and stable Respiratory status: spontaneous breathing, nonlabored ventilation, respiratory function stable and patient connected to nasal cannula oxygen Cardiovascular status: blood pressure returned to baseline and stable Postop Assessment: no apparent nausea or vomiting Anesthetic complications: no     Last Vitals:  Vitals:   12/04/17 1351 12/04/17 1401  BP: 122/69   Pulse: 78 72  Resp: 14 15  Temp:    SpO2: 100% 100%    Last Pain:  Vitals:   12/04/17 1401  TempSrc:   PainSc: 0-No pain                 Lenard Simmer

## 2017-12-04 NOTE — Transfer of Care (Signed)
Immediate Anesthesia Transfer of Care Note  Patient: JERI JEANBAPTISTE  Procedure(s) Performed: ESOPHAGEAL ENDOSCOPIC ULTRASOUND (EUS) RADIAL (N/A )  Patient Location: PACU  Anesthesia Type:General  Level of Consciousness: awake and sedated  Airway & Oxygen Therapy: Patient Spontanous Breathing and Patient connected to nasal cannula oxygen  Post-op Assessment: Report given to RN and Post -op Vital signs reviewed and stable  Post vital signs: Reviewed and stable  Last Vitals:  Vitals Value Taken Time  BP    Temp    Pulse    Resp 18 12/04/2017  1:31 PM  SpO2    Vitals shown include unvalidated device data.  Last Pain:  Vitals:   12/04/17 1234  TempSrc: Tympanic  PainSc: 0-No pain         Complications: No apparent anesthesia complications

## 2017-12-04 NOTE — Op Note (Signed)
[facility] Patient Name: Katie [name] Procedure Date: 12/04/2017 12:22 PM MRN: [id number] Account #: [id number] Date of Birth: 05-Aug-1965 Admit Type: Outpatient Age: 52 Room: [facility] ENDO ROOM 3 Gender: Female Note Status: Finalized Procedure:            Upper EUS Indications:          Duodenal mucosal mass/polyp found on endoscopy Patient Profile:      Refer to note in patient chart for documentation of                        history and physical. Providers:            Murray Hodgkins. Costa Jha Referring MD:         Einar Pheasant, MD (Referring MD), Lollie Sails,                        MD (Referring MD) Medicines:            Propofol per Anesthesia Complications:        No immediate complications. Procedure:            Pre-Anesthesia Assessment:                       Prior to the procedure, a History and Physical was                        performed, and patient medications and allergies were                        reviewed. The patient is competent. The risks and                        benefits of the procedure and the sedation options and                        risks were discussed with the patient. All questions                        were answered and informed consent was obtained.                        Patient identification and proposed procedure were                        verified by the physician, the nurse and the                        anesthetist in the pre-procedure area. Mental Status                        Examination: alert and oriented. Airway Examination:                        normal oropharyngeal airway and neck mobility.                        Respiratory Examination: clear to auscultation. CV                        Examination: normal. Prophylactic Antibiotics: The  patient does not require prophylactic antibiotics.                        Prior Anticoagulants: The patient has taken no previous                   anticoagulant or antiplatelet agents. ASA Grade                        Assessment: II - A patient with mild systemic disease.                        After reviewing the risks and benefits, the patient was                        deemed in satisfactory condition to undergo the                        procedure. The anesthesia plan was to use monitored                        anesthesia care (MAC). Immediately prior to                        administration of medications, the patient was                        re-assessed for adequacy to receive sedatives. The                        heart rate, respiratory rate, oxygen saturations, blood                        pressure, adequacy of pulmonary ventilation, and                        response to care were monitored throughout the                        procedure. The physical status of the patient was                        re-assessed after the procedure.                       After obtaining informed consent, the endoscope was                        passed under direct vision. Throughout the procedure,                        the patient's blood pressure, pulse, and oxygen                        saturations were monitored continuously. The Endoscope                        was introduced through the mouth, and advanced to the                        second part of duodenum. The EUS  GI Radial Array                        [id number] was introduced through the mouth, and advanced                        to the duodenum for ultrasound examination from the                        stomach and duodenum. The upper EUS was accomplished                        without difficulty. The patient tolerated the procedure                        well. Findings:      ENDOSCOPIC FINDING: :      The examined esophagus was endoscopically normal.      Multiple small sessile polyps were found in the gastric fundus and in       the gastric body.      The exam  of the stomach was otherwise normal.      A single 10 mm semi-sessile polyp was found in the duodenal bulb.       Biopsies were taken with a cold forceps for histology.      The second portion of the duodenum was normal.      ENDOSONOGRAPHIC FINDING: :      An oval intramural (subepithelial) lesion was found in the duodenal       bulb. The lesion was isoechoic. Endosonographically, the lesion appeared       to originate from within the luminal interface/superficial mucosa (Layer       1) and deep mucosa (Layer 2). The lesion also appeared to involve the       following wall layer(s): submucosa (Layer 3). The lesion measured 14 mm       (in maximum thickness). The lesion also measured 9 mm in diameter. The       outer margins were well defined.      No lymphadenopathy seen.      There was no sign of significant endosonographic abnormality in the       gallbladder.      Endosonographic imaging in the left lobe of the liver showed no       abnormalities.      The region of the celiac plexus and celiac ganglia was visualized and       showed no sign of significant endosonographic abnormality. Impression:           EGD Impressions:                       - Normal esophagus.                       - Multiple gastric polyps. Consistent in appearance                        with benign fundic gland polyps.                       - A single duodenal polyp in the bulb. Biopsied.  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                       - Normal second portion of the duodenum.                       EUS Impressions:                       - An intramural (subepithelial) lesion was found in the                        duodenal bulb. The lesion appeared to originate from                        within the luminal interface/superficial mucosa (Layer                        1) and deep mucosa (Layer 2). Differential includes a                        Brunner's gland,  pancreatic rest, lipoma. Less likely a                        neuroendocrine tumor given appearance.                       - There was no sign of significant pathology in the                        gallbladder.                       - No lymphadenopathy seen.                       - Normal visualized portions of the liver.                       - Normal celiac region. Recommendation:       - Discharge patient to home (ambulatory).                       - Await path results.                       - No further surveillance procedures necessary if                        pathology confirms a benign Brunner's gland (or other                        benign etiology).                       - Repeat the EUS at [facility] in 6 months for surveillance                        if biopsies are nondiagnostic. May consider EMR at that                        time.                       -  The findings and recommendations were discussed with                        the patient and her family.                       - The findings and recommendations were discussed with                        the referring physician. Procedure Code(s):    --- Professional ---                       [phone], Esophagogastroduodenoscopy, flexible, transoral;                        with endoscopic ultrasound examination, including the                        esophagus, stomach, and either the duodenum or a                        surgically altered stomach where the jejunum is                        examined distal to the anastomosis                       43239, 102, Esophagogastroduodenoscopy, flexible,                        transoral; with biopsy, single or multiple Diagnosis Code(s):    --- Professional ---                       K31.89, Other diseases of stomach and duodenum                       K31.7, Polyp of stomach and duodenum CPT copyright 2017 American Medical Association. All rights reserved. The codes documented in this report  are preliminary and upon coder review may  be revised to meet current compliance requirements. Attending Participation:      I personally performed the entire procedure without the assistance of a       fellow, resident or surgical assistant. [place],  12/04/2017 1:34:16 PM This report has been signed electronically. Number of Addenda: 0 Note Initiated On: 12/04/2017 12:22 PM Estimated Blood Loss: Estimated blood loss: none.      [facility]

## 2017-12-04 NOTE — H&P (Signed)
Patient presented for planned upper EUS of a suspected submucosal lesion in the duodenal bulb seen on recent EGD.  No changes in her history since time of recent EGD.  Examination is unchanged. Patient is agreeable to proceeding with an EUS for further evaluation.

## 2017-12-04 NOTE — Anesthesia Post-op Follow-up Note (Signed)
Anesthesia QCDR form completed.        

## 2017-12-05 LAB — SURGICAL PATHOLOGY

## 2017-12-08 ENCOUNTER — Encounter: Payer: Self-pay | Admitting: Internal Medicine

## 2017-12-09 NOTE — Progress Notes (Signed)
EUS and pathology routed to Dr. Marva Panda for follow up Oncology Nurse Navigator Documentation  Navigator Location: [facility] (12/09/17 1400)   )                                                     Time Spent with Patient: 15 (12/09/17 1400)

## 2018-01-30 ENCOUNTER — Other Ambulatory Visit: Payer: Self-pay | Admitting: Gastroenterology

## 2018-01-30 DIAGNOSIS — K760 Fatty (change of) liver, not elsewhere classified: Secondary | ICD-10-CM

## 2018-02-03 ENCOUNTER — Ambulatory Visit: Payer: Managed Care, Other (non HMO)

## 2018-02-06 ENCOUNTER — Ambulatory Visit
Admission: RE | Admit: 2018-02-06 | Discharge: 2018-02-06 | Disposition: A | Discharge status: Home / Self Care | Payer: Managed Care, Other (non HMO) | Source: Ambulatory Visit | Attending: Gastroenterology | Admitting: Gastroenterology

## 2018-02-06 DIAGNOSIS — K760 Fatty (change of) liver, not elsewhere classified: Secondary | ICD-10-CM | POA: Diagnosis not present

## 2018-02-27 ENCOUNTER — Telehealth: Payer: Self-pay | Admitting: *Deleted

## 2018-02-27 NOTE — Telephone Encounter (Signed)
Copied from CRM [phone]. Topic: Appointment Scheduling - Scheduling Inquiry for Clinic >> Feb 27, 2018  7:51 AM Oneal GroutSebastian, Jennifer S wrote: Reason for CRM:  Patient scheduled for CPE in [place], needs prior to oct 1 due to insurance, no appts available. Please advise if able to work in

## 2018-03-04 NOTE — Telephone Encounter (Signed)
Pt rescheduled

## 2018-03-04 NOTE — Telephone Encounter (Signed)
Left message to call back  

## 2018-03-16 ENCOUNTER — Ambulatory Visit: Payer: Managed Care, Other (non HMO) | Admitting: Dietician

## 2018-03-17 ENCOUNTER — Encounter: Payer: Self-pay | Admitting: Dietician

## 2018-03-24 ENCOUNTER — Encounter: Payer: Managed Care, Other (non HMO) | Admitting: Internal Medicine

## 2018-03-26 ENCOUNTER — Other Ambulatory Visit: Payer: Self-pay | Admitting: Internal Medicine

## 2018-03-26 DIAGNOSIS — Z1231 Encounter for screening mammogram for malignant neoplasm of breast: Secondary | ICD-10-CM

## 2018-03-27 ENCOUNTER — Ambulatory Visit (INDEPENDENT_AMBULATORY_CARE_PROVIDER_SITE_OTHER): Payer: Managed Care, Other (non HMO) | Admitting: Internal Medicine

## 2018-03-27 ENCOUNTER — Encounter: Payer: Self-pay | Admitting: Internal Medicine

## 2018-03-27 ENCOUNTER — Other Ambulatory Visit: Payer: Self-pay | Admitting: Internal Medicine

## 2018-03-27 VITALS — BP 132/78 | HR 73 | Temp 98.7°F | Resp 18 | Ht 66.0 in | Wt 231.2 lb

## 2018-03-27 DIAGNOSIS — R945 Abnormal results of liver function studies: Secondary | ICD-10-CM

## 2018-03-27 DIAGNOSIS — Z23 Encounter for immunization: Secondary | ICD-10-CM | POA: Diagnosis not present

## 2018-03-27 DIAGNOSIS — Z13220 Encounter for screening for lipoid disorders: Secondary | ICD-10-CM

## 2018-03-27 DIAGNOSIS — Z0000 Encounter for general adult medical examination without abnormal findings: Secondary | ICD-10-CM | POA: Diagnosis not present

## 2018-03-27 DIAGNOSIS — D649 Anemia, unspecified: Secondary | ICD-10-CM

## 2018-03-27 DIAGNOSIS — K219 Gastro-esophageal reflux disease without esophagitis: Secondary | ICD-10-CM

## 2018-03-27 DIAGNOSIS — R7989 Other specified abnormal findings of blood chemistry: Secondary | ICD-10-CM

## 2018-03-27 DIAGNOSIS — R1013 Epigastric pain: Secondary | ICD-10-CM

## 2018-03-27 DIAGNOSIS — Z124 Encounter for screening for malignant neoplasm of cervix: Secondary | ICD-10-CM

## 2018-03-27 DIAGNOSIS — N809 Endometriosis, unspecified: Secondary | ICD-10-CM

## 2018-03-27 DIAGNOSIS — R1010 Upper abdominal pain, unspecified: Secondary | ICD-10-CM

## 2018-03-27 DIAGNOSIS — R109 Unspecified abdominal pain: Secondary | ICD-10-CM | POA: Insufficient documentation

## 2018-03-27 NOTE — Assessment & Plan Note (Addendum)
Physical today 03/27/18.  PAP 03/14/15 - negative with negative HPV.  Mammogram 05/21/18 - scheduled.  Colonoscopy 2016.  Recommended f/u in 5 years.

## 2018-03-27 NOTE — Progress Notes (Signed)
Patient ID: Katie [name], female   DOB: 18-Dec-1965, 52 y.o.   MRN: [id number]   Subjective:    Patient ID: Katie [name], female    DOB: 1965/10/22, 52 y.o.   MRN: [id number]  HPI  Patient here for her physical exam. She saw GI 01/30/18 for f/u of NAFLD and chronic epigastric pain and dyspepsia.  Was given levsin to take prn.  Has not used.  Overall she feels symptoms are stable.  Was referred to a nutritionist.  Eating and drinking.  No nausea or vomiting.  No chest pain.  Breathing stable. Bowels moving.  Some drainage.  Discussed using nasacort.    Past Medical History:  Diagnosis Date  . Anemia   . Endometriosis   . Erosive gastritis   . GERD (gastroesophageal reflux disease)   . History of kidney stones   . Hyperplastic colon polyp   . Irritable bowel syndrome   . Kidney stones   . Migraines   . Nonalcoholic fatty liver disease   . Ovarian cyst   . Positional vertigo    PT helped   Past Surgical History:  Procedure Laterality Date  . ABDOMINAL HYSTERECTOMY  2000   left oophorectomy, Dr Mia Creek  . APPENDECTOMY  2000  . CESAREAN SECTION    . COLONOSCOPY    . ESOPHAGOGASTRODUODENOSCOPY    . EUS N/A 12/04/2017   Procedure: ESOPHAGEAL ENDOSCOPIC ULTRASOUND (EUS) RADIAL;  Surgeon: Bearl Mulberry, MD;  Location: [facility] ENDOSCOPY;  Service: Gastroenterology;  Laterality: N/A;  . EXPLORATORY LAPAROTOMY    . IMAGE GUIDED SINUS SURGERY Bilateral 08/29/2016   Procedure: IMAGE GUIDED SINUS SURGERY;  Surgeon: Vernie Murders, MD;  Location: [facility] SURGERY CNTR;  Service: ENT;  Laterality: Bilateral;  NEED DISK GAVE DISK TO CECE 1-23 KP  . LITHOTRIPSY    . RIGHT OOPHORECTOMY  2002  . SEPTOPLASTY Bilateral 08/29/2016   Procedure: SEPTOPLASTY;  Surgeon: Vernie Murders, MD;  Location: [facility] SURGERY CNTR;  Service: ENT;  Laterality: Bilateral;  . SPHENOIDECTOMY Right 08/29/2016   Procedure: SPHENOIDECTOMY  with removal content right;  Surgeon: Vernie Murders, MD;  Location: [facility] SURGERY  CNTR;  Service: ENT;  Laterality: Right;  Marland Kitchen VAGINAL BIRTH AFTER CESAREAN SECTION     Family History  Problem Relation Age of Onset  . Hypertension Mother   . Diabetes Mother   . Thyroid cancer Mother   . Colon polyps Mother   . Thyroid cancer Maternal Aunt   . Colon polyps Maternal Aunt   . Kidney cancer Maternal Uncle   . Breast cancer Neg Hx    Social History   Socioeconomic History  . Marital status: Married    Spouse name: Not on file  . Number of children: 2  . Years of education: Not on file  . Highest education level: Not on file  Occupational History  . Not on file  Social Needs  . Financial resource strain: Not on file  . Food insecurity:    Worry: Not on file    Inability: Not on file  . Transportation needs:    Medical: Not on file    Non-medical: Not on file  Tobacco Use  . Smoking status: Never Smoker  . Smokeless tobacco: Never Used  Substance and Sexual Activity  . Alcohol use: No    Alcohol/week: 0.0 standard drinks  . Drug use: No  . Sexual activity: Not on file    Comment: Married   Lifestyle  . Physical activity:    Days  per week: Not on file    Minutes per session: Not on file  . Stress: Not on file  Relationships  . Social connections:    Talks on phone: Not on file    Gets together: Not on file    Attends religious service: Not on file    Active member of club or organization: Not on file    Attends meetings of clubs or organizations: Not on file    Relationship status: Not on file  Other Topics Concern  . Not on file  Social History Narrative  . Not on file    Outpatient Encounter Medications as of 03/27/2018  Medication Sig  . pantoprazole (PROTONIX) 40 MG tablet Take one tablet 30 minutes before breakfast  . Probiotic Product (PHILLIPS COLON HEALTH PO) Take by mouth.  . [DISCONTINUED] cefUROXime (CEFTIN) 500 MG tablet Take 1 tablet (500 mg total) by mouth 2 (two) times daily with a meal. (Patient not taking: Reported on 09/30/2017)   . [DISCONTINUED] sucralfate (CARAFATE) 1 g tablet Take 1 tablet (1 g total) by mouth 4 (four) times daily. (Patient not taking: Reported on 12/04/2017)   No facility-administered encounter medications on file as of 03/27/2018.     Review of Systems  Constitutional: Negative for appetite change and unexpected weight change.  HENT: Positive for postnasal drip. Negative for congestion and sinus pressure.   Eyes: Negative for pain and visual disturbance.  Respiratory: Negative for cough, chest tightness and shortness of breath.   Cardiovascular: Negative for chest pain, palpitations and leg swelling.  Gastrointestinal: Negative for diarrhea, nausea and vomiting.  Genitourinary: Negative for difficulty urinating and dysuria.  Musculoskeletal: Negative for joint swelling and myalgias.  Skin: Negative for color change and rash.  Neurological: Negative for dizziness, light-headedness and headaches.  Hematological: Negative for adenopathy. Does not bruise/bleed easily.  Psychiatric/Behavioral: Negative for agitation and dysphoric mood.       Objective:     Blood pressure rechecked by me:  122/80  Physical Exam  Constitutional: She is oriented to person, place, and time. She appears well-developed and well-nourished. No distress.  HENT:  Nose: Nose normal.  Mouth/Throat: Oropharynx is clear and moist.  Eyes: Right [name] exhibits no discharge. Left [name] exhibits no discharge. No scleral icterus.  Neck: Neck supple. No thyromegaly present.  Cardiovascular: Normal rate and regular rhythm.  Pulmonary/Chest: Breath sounds normal. No accessory muscle usage. No tachypnea. No respiratory distress. She has no decreased breath sounds. She has no wheezes. She has no rhonchi. Right breast exhibits no inverted nipple, no mass, no nipple discharge and no tenderness (no axillary adenopathy). Left breast exhibits no inverted nipple, no mass, no nipple discharge and no tenderness (no axilarry adenopathy).    Abdominal: Soft. Bowel sounds are normal. There is no tenderness.  Genitourinary:  Genitourinary Comments: Normal external genitalia.  Vaginal vault without lesions.  S/p hysterectomy.  Pap smear performed.  Could not appreciate any adnexal masses or tenderness.    Musculoskeletal: She exhibits no edema or tenderness.  Lymphadenopathy:    She has no cervical adenopathy.  Neurological: She is alert and oriented to person, place, and time.  Skin: No rash noted. No erythema.  Psychiatric: She has a normal mood and affect. Her behavior is normal.    BP 132/78 (BP Location: Left Arm, Patient Position: Sitting, Cuff Size: Normal)   Pulse 73   Temp 98.7 F (37.1 C) (Oral)   Resp 18   Ht 5\' 6"  (1.676 m)   Wt  231 lb 3.2 oz (104.9 kg)   LMP 08/27/1998   SpO2 98%   BMI 37.32 kg/m  Wt Readings from Last 3 Encounters:  03/27/18 231 lb 3.2 oz (104.9 kg)  12/04/17 223 lb (101.2 kg)  09/30/17 223 lb (101.2 kg)     Lab Results  Component Value Date   WBC 6.2 03/27/2018   HGB 12.5 03/27/2018   HCT 35.9 03/27/2018   PLT 204 03/27/2018   GLUCOSE 95 03/27/2018   CHOL 205 (H) 03/27/2018   TRIG 107 03/27/2018   HDL 52 03/27/2018   LDLCALC 132 (H) 03/27/2018   ALT 39 (H) 03/27/2018   AST 34 03/27/2018   NA 143 03/27/2018   K 5.0 03/27/2018   CL 103 03/27/2018   CREATININE 0.77 03/27/2018   BUN 13 03/27/2018   CO2 24 03/27/2018   TSH 1.660 03/27/2018   HGBA1C 5.0 03/27/2018    US Abdomen Complete W/elastography  Result Date: 02/06/2018 CLINICAL DATA:  Nonalcoholic fatty liver disease. EXAM: ULTRASOUND ABDOMEN ULTRASOUND HEPATIC ELASTOGRAPHY TECHNIQUE: Sonography of the upper abdomen was performed. In addition, ultrasound elastography evaluation of the liver was performed. A region of interest was placed within the right lobe of the liver. Following application of a compressive sonographic pulse, shear waves were detected in the adjacent hepatic tissue and the shear wave velocity was  calculated. Multiple assessments were performed at the selected site. Median shear wave velocity is correlated to a Metavir fibrosis score. COMPARISON:  Noncontrast CT on 12/09/2016 FINDINGS: ULTRASOUND ABDOMEN Gallbladder: No gallstones or wall thickening visualized. No sonographic Murphy sign noted by sonographer. Common bile duct: Diameter: 4 mm, within normal limits. Liver: Diffusely increased echogenicity of the hepatic parenchyma, consistent with hepatic steatosis. No focal mass lesion identified. Portal vein is patent on color Doppler imaging with normal direction of blood flow towards the liver. IVC: No abnormality visualized. Pancreas: Visualized portion unremarkable. Spleen: Size and appearance within normal limits. Right Kidney: Length: 11.4 cm. Echogenicity within normal limits. No mass or hydronephrosis visualized. Left Kidney: Length: 12.0 cm. Echogenicity within normal limits. No mass or hydronephrosis visualized. Abdominal aorta: No aneurysm visualized. Other findings: None. ULTRASOUND HEPATIC ELASTOGRAPHY Device: Siemens Helix VTQ Patient position: Supine Transducer 4V1 Number of measurements: 10 Hepatic segment:  8 Median velocity:   1.21 m/sec IQR: 0.11 IQR/Median velocity ratio: 0.09 Corresponding Metavir fibrosis score:  F2 + some F3 Risk of fibrosis: Moderate Limitations of exam: None Please note that abnormal shear wave velocities may also be identified in clinical settings other than with hepatic fibrosis, such as: acute hepatitis, elevated right heart and central venous pressures including use of beta blockers, veno-occlusive disease (Budd-Chiari), infiltrative processes such as mastocytosis/amyloidosis/infiltrative tumor, extrahepatic cholestasis, in the post-prandial state, and liver transplantation. Correlation with patient history, laboratory data, and clinical condition recommended. IMPRESSION: ULTRASOUND ABDOMEN: Diffuse hepatic steatosis.  Otherwise unremarkable exam. ULTRASOUND  HEPATIC ELASTOGRAPHY: Median hepatic shear wave velocity is calculated at 1.21 m/sec. Corresponding Metavir fibrosis score is F2 + some F3. Risk of fibrosis is Moderate. Follow-up: Additional testing appropriate Electronically Signed   By: Myles Rosenthal M.D.   On: 02/06/2018 11:13       Assessment & Plan:   Problem List Items Addressed This Visit    Abdominal pain   Relevant Orders   Basic metabolic panel (Completed)   Abdominal pain, epigastric    Has been seeing GI.  Had EGD and colonoscopy.  CT scan unrevealing.  HIDA negative.  On protonix.  Has levsin if  needed. Overall she feels symptoms are stable.  Continue f/u with GI.       Abnormal liver function tests    Diagnosed with NAFLD.  Have discussed diet and exercise and weight loss.  Follow liver function tests.        Relevant Orders   Hepatic function panel (Completed)   VITAMIN D 25 Hydroxy (Vit-D Deficiency, Fractures) (Completed)   Hemoglobin A1c (Completed)   Anemia    Has seen GI as outlined.  Had previous endoscopies.  Follow cbc and ferritin.        Relevant Orders   CBC with Differential/Platelet (Completed)   TSH (Completed)   Ferritin (Completed)   Endometriosis    S/p hysterectomy.  PAP today.       GERD (gastroesophageal reflux disease)    On protonix.  Stable.       Relevant Medications   Probiotic Product (PHILLIPS COLON HEALTH PO)   Health care maintenance    Physical today 03/27/18.  PAP 03/14/15 - negative with negative HPV.  Mammogram 05/21/18 - scheduled.  Colonoscopy 2016.  Recommended f/u in 5 years.         Other Visit Diagnoses    Need for influenza vaccination    -  Primary   Relevant Orders   Flu Vaccine QUAD 6+ mos PF IM (Fluarix Quad PF) (Completed)   Screening cholesterol level       Relevant Orders   Lipid panel (Completed)   Cervical cancer screening       Relevant Orders   Cytology - PAP       Dale Bethesda, MD

## 2018-03-28 ENCOUNTER — Encounter: Payer: Self-pay | Admitting: Internal Medicine

## 2018-03-28 LAB — BASIC METABOLIC PANEL
BUN / CREAT RATIO: 17 (ref 9–23)
BUN: 13 mg/dL (ref 6–24)
CHLORIDE: 103 mmol/L (ref 96–106)
CO2: 24 mmol/L (ref 20–29)
Calcium: 9.5 mg/dL (ref 8.7–10.2)
Creatinine, Ser: 0.77 mg/dL (ref 0.57–1.00)
GFR calc Af Amer: 104 mL/min/{1.73_m2} (ref >59)
GFR calc non Af Amer: 90 mL/min/{1.73_m2} (ref >59)
GLUCOSE: 95 mg/dL (ref 65–99)
Potassium: 5 mmol/L (ref 3.5–5.2)
SODIUM: 143 mmol/L (ref 134–144)

## 2018-03-28 LAB — CBC WITH DIFFERENTIAL/PLATELET
BASOS: 0 %
Basophils Absolute: 0 10*3/uL (ref 0.0–0.2)
EOS (ABSOLUTE): 0.1 10*3/uL (ref 0.0–0.4)
Eos: 2 %
HEMOGLOBIN: 12.5 g/dL (ref 11.1–15.9)
Hematocrit: 35.9 % (ref 34.0–46.6)
Immature Grans (Abs): 0 10*3/uL (ref 0.0–0.1)
Immature Granulocytes: 0 %
LYMPHS: 25 %
Lymphocytes Absolute: 1.5 10*3/uL (ref 0.7–3.1)
MCH: 29 pg (ref 26.6–33.0)
MCHC: 34.8 g/dL (ref 31.5–35.7)
MCV: 83 fL (ref 79–97)
MONOCYTES: 7 %
Monocytes Absolute: 0.4 10*3/uL (ref 0.1–0.9)
NEUTROS ABS: 4.1 10*3/uL (ref 1.4–7.0)
Neutrophils: 66 %
Platelets: 204 10*3/uL (ref 150–450)
RBC: 4.31 x10E6/uL (ref 3.77–5.28)
RDW: 13.9 % (ref 12.3–15.4)
WBC: 6.2 10*3/uL (ref 3.4–10.8)

## 2018-03-28 LAB — FERRITIN: FERRITIN: 54 ng/mL (ref 15–150)

## 2018-03-28 LAB — HEPATIC FUNCTION PANEL
ALK PHOS: 141 IU/L — AB (ref 39–117)
ALT: 39 IU/L — AB (ref 0–32)
AST: 34 IU/L (ref 0–40)
Albumin: 4.5 g/dL (ref 3.5–5.5)
BILIRUBIN, DIRECT: 0.2 mg/dL (ref 0.00–0.40)
Bilirubin Total: 0.6 mg/dL (ref 0.0–1.2)
Total Protein: 7.1 g/dL (ref 6.0–8.5)

## 2018-03-28 LAB — LIPID PANEL
CHOLESTEROL TOTAL: 205 mg/dL — AB (ref 100–199)
Chol/HDL Ratio: 3.9 ratio (ref 0.0–4.4)
HDL: 52 mg/dL (ref >39)
LDL Calculated: 132 mg/dL — ABNORMAL HIGH (ref 0–99)
Triglycerides: 107 mg/dL (ref 0–149)
VLDL CHOLESTEROL CAL: 21 mg/dL (ref 5–40)

## 2018-03-28 LAB — TSH: TSH: 1.66 u[IU]/mL (ref 0.450–4.500)

## 2018-03-28 LAB — VITAMIN D 25 HYDROXY (VIT D DEFICIENCY, FRACTURES): Vit D, 25-Hydroxy: 24.8 ng/mL — ABNORMAL LOW (ref 30.0–100.0)

## 2018-03-28 LAB — HEMOGLOBIN A1C
Est. average glucose Bld gHb Est-mCnc: 97 mg/dL
Hgb A1c MFr Bld: 5 % (ref 4.8–5.6)

## 2018-03-28 NOTE — Assessment & Plan Note (Signed)
Has been seeing GI.  Had EGD and colonoscopy.  CT scan unrevealing.  HIDA negative.  On protonix.  Has levsin if needed. Overall she feels symptoms are stable.  Continue f/u with GI.

## 2018-03-28 NOTE — Assessment & Plan Note (Signed)
On protonix.  Stable.  

## 2018-03-28 NOTE — Assessment & Plan Note (Signed)
Diagnosed with NAFLD.  Have discussed diet and exercise and weight loss.  Follow liver function tests.

## 2018-03-28 NOTE — Assessment & Plan Note (Signed)
S/p hysterectomy. PAP today.  

## 2018-03-28 NOTE — Assessment & Plan Note (Signed)
Has seen GI as outlined.  Had previous endoscopies.  Follow cbc and ferritin.

## 2018-03-30 ENCOUNTER — Other Ambulatory Visit: Payer: Self-pay

## 2018-03-30 ENCOUNTER — Telehealth: Payer: Self-pay

## 2018-03-30 NOTE — Telephone Encounter (Signed)
Copied from CRM [id number]. Topic: General - Other >> Mar 30, 2018  3:43 PM Angela Nevin wrote: CRM for notification. See Telephone encounter for: 03/30/18.  Kim from [employer] called stating they need to verify a test # for gyn source pap smear.   Selena Batten is requesting a call back at [phone]

## 2018-03-30 NOTE — Progress Notes (Unsigned)
lab

## 2018-03-30 NOTE — Telephone Encounter (Signed)
Test # and gyn source given to [facility]

## 2018-04-03 ENCOUNTER — Telehealth: Payer: Self-pay | Admitting: Internal Medicine

## 2018-04-03 LAB — PAP LB AND HPV HIGH-RISK
HPV, high-risk: NEGATIVE
PAP SMEAR COMMENT: 0

## 2018-04-03 NOTE — Telephone Encounter (Unsigned)
Copied from CRM [phone]. Topic: General - Other >> Apr 03, 2018  2:55 PM Mcneil, Ja-Kwan wrote: Reason for CRM: Pt returned call for lab results. Pt requests call back after 4 pm.

## 2018-04-03 NOTE — Telephone Encounter (Signed)
See result note.  

## 2018-04-07 ENCOUNTER — Telehealth: Payer: Self-pay

## 2018-04-07 NOTE — Telephone Encounter (Signed)
Patient would prefers to [name] clinic, if possibel any day but Tuesday s or Thursdays.

## 2018-04-07 NOTE — Telephone Encounter (Signed)
See pap smear result note and recommendation for referral to gyn.

## 2018-04-07 NOTE — Telephone Encounter (Signed)
[facility] faxed pap results. They are in your folder for review

## 2018-04-07 NOTE — Telephone Encounter (Signed)
Copied from CRM [phone]. Topic: Quick Communication - Office Called Patient >> Apr 07, 2018 11:03 AM Katie [name], Norma J wrote: Reason for CRM: pt said office called her. Pt is waiting on her pap smear result

## 2018-04-08 ENCOUNTER — Other Ambulatory Visit: Payer: Self-pay | Admitting: Internal Medicine

## 2018-04-08 DIAGNOSIS — R87629 Unspecified abnormal cytological findings in specimens from vagina: Secondary | ICD-10-CM

## 2018-04-08 NOTE — Progress Notes (Signed)
Order placed for referral to gyn.  

## 2018-04-08 NOTE — Telephone Encounter (Signed)
Order placed for referral to gyn.  

## 2018-05-11 LAB — HM COLONOSCOPY

## 2018-05-21 ENCOUNTER — Ambulatory Visit
Admission: RE | Admit: 2018-05-21 | Discharge: 2018-05-21 | Disposition: A | Discharge status: Home / Self Care | Payer: Managed Care, Other (non HMO) | Source: Ambulatory Visit | Attending: Internal Medicine | Admitting: Internal Medicine

## 2018-05-21 DIAGNOSIS — Z1231 Encounter for screening mammogram for malignant neoplasm of breast: Secondary | ICD-10-CM

## 2018-06-08 ENCOUNTER — Encounter: Payer: Managed Care, Other (non HMO) | Admitting: Internal Medicine

## 2018-09-25 ENCOUNTER — Ambulatory Visit: Payer: No Typology Code available for payment source | Admitting: Internal Medicine

## 2018-09-25 ENCOUNTER — Encounter: Payer: Self-pay | Admitting: Internal Medicine

## 2018-09-25 VITALS — BP 118/70 | HR 84 | Temp 98.4°F | Resp 16 | Wt 233.6 lb

## 2018-09-25 DIAGNOSIS — N809 Endometriosis, unspecified: Secondary | ICD-10-CM

## 2018-09-25 DIAGNOSIS — R7989 Other specified abnormal findings of blood chemistry: Secondary | ICD-10-CM

## 2018-09-25 DIAGNOSIS — R945 Abnormal results of liver function studies: Secondary | ICD-10-CM | POA: Diagnosis not present

## 2018-09-25 DIAGNOSIS — K219 Gastro-esophageal reflux disease without esophagitis: Secondary | ICD-10-CM

## 2018-09-25 DIAGNOSIS — Z13220 Encounter for screening for lipoid disorders: Secondary | ICD-10-CM | POA: Diagnosis not present

## 2018-09-25 DIAGNOSIS — R0602 Shortness of breath: Secondary | ICD-10-CM | POA: Diagnosis not present

## 2018-09-25 DIAGNOSIS — D649 Anemia, unspecified: Secondary | ICD-10-CM | POA: Diagnosis not present

## 2018-09-25 DIAGNOSIS — Z8371 Family history of colonic polyps: Secondary | ICD-10-CM

## 2018-09-25 NOTE — Progress Notes (Signed)
Patient ID: Katie [name], female   DOB: [birth year], 53 y.o.   MRN: [id number]   Subjective:    Patient ID: Katie [name], female    DOB: 27-Mar-1966, 53 y.o.   MRN: [id number]  HPI  Patient here for a scheduled follow up.  She reports she is doing relatively well.  Increased stress with family medical issues and other family issues.  Overall feels she is handling things relatively well.  Reports some sinus pressure.  States has not had a lot of issues since her sinus surgery, but lately has noticed an increase.  Plans to f/u with ENT since the flushes, etc not helping.  No chest pain.  Does report sob with exertion - specifically stairs.  Was questioning if just related to being out of shape, but has noticed some increased sob with stairs. No acid reflux.  Taking protonix.  Controls.  Seeing GI.  They are following liver as well.  No changes made last visit.  No nausea or vomiting.  No abdominal pain.  Bowels stable.  Discussed diet and exercise.  Planning to meet with a nutritionist (with her husband).     Past Medical History:  Diagnosis Date  . Anemia   . Endometriosis   . Erosive gastritis   . GERD (gastroesophageal reflux disease)   . History of kidney stones   . Hyperplastic colon polyp   . Irritable bowel syndrome   . Kidney stones   . Migraines   . Nonalcoholic fatty liver disease   . Ovarian cyst   . Positional vertigo    PT helped   Past Surgical History:  Procedure Laterality Date  . ABDOMINAL HYSTERECTOMY  2000   left oophorectomy, Dr Mia Creek  . APPENDECTOMY  2000  . CESAREAN SECTION    . COLONOSCOPY    . ESOPHAGOGASTRODUODENOSCOPY    . EUS N/A 12/04/2017   Procedure: ESOPHAGEAL ENDOSCOPIC ULTRASOUND (EUS) RADIAL;  Surgeon: Bearl Mulberry, MD;  Location: [facility] ENDOSCOPY;  Service: Gastroenterology;  Laterality: N/A;  . EXPLORATORY LAPAROTOMY    . IMAGE GUIDED SINUS SURGERY Bilateral 08/29/2016   Procedure: IMAGE GUIDED SINUS SURGERY;  Surgeon: Vernie Murders, MD;   Location: [name] SURGERY CNTR;  Service: ENT;  Laterality: Bilateral;  NEED DISK GAVE DISK TO CECE 1-23 KP  . LITHOTRIPSY    . RIGHT OOPHORECTOMY  2002  . SEPTOPLASTY Bilateral 08/29/2016   Procedure: SEPTOPLASTY;  Surgeon: Vernie Murders, MD;  Location: [facility] SURGERY CNTR;  Service: ENT;  Laterality: Bilateral;  . SPHENOIDECTOMY Right 08/29/2016   Procedure: SPHENOIDECTOMY  with removal content right;  Surgeon: Vernie Murders, MD;  Location: [facility] SURGERY CNTR;  Service: ENT;  Laterality: Right;  Marland Kitchen VAGINAL BIRTH AFTER CESAREAN SECTION     Family History  Problem Relation Age of Onset  . Hypertension Mother   . Diabetes Mother   . Thyroid cancer Mother   . Colon polyps Mother   . Thyroid cancer Maternal Aunt   . Colon polyps Maternal Aunt   . Kidney cancer Maternal Uncle   . Breast cancer Neg Hx    Social History   Socioeconomic History  . Marital status: Married    Spouse name: Not on file  . Number of children: 2  . Years of education: Not on file  . Highest education level: Not on file  Occupational History  . Not on file  Social Needs  . Financial resource strain: Not on file  . Food insecurity:    Worry: Not on  file    Inability: Not on file  . Transportation needs:    Medical: Not on file    Non-medical: Not on file  Tobacco Use  . Smoking status: Never Smoker  . Smokeless tobacco: Never Used  Substance and Sexual Activity  . Alcohol use: No    Alcohol/week: 0.0 standard drinks  . Drug use: No  . Sexual activity: Not on file    Comment: Married   Lifestyle  . Physical activity:    Days per week: Not on file    Minutes per session: Not on file  . Stress: Not on file  Relationships  . Social connections:    Talks on phone: Not on file    Gets together: Not on file    Attends religious service: Not on file    Active member of club or organization: Not on file    Attends meetings of clubs or organizations: Not on file    Relationship status: Not on file  Other  Topics Concern  . Not on file  Social History Narrative  . Not on file    Outpatient Encounter Medications as of 09/25/2018  Medication Sig  . pantoprazole (PROTONIX) 40 MG tablet Take one tablet 30 minutes before breakfast  . Probiotic Product (PHILLIPS COLON HEALTH PO) Take by mouth.   No facility-administered encounter medications on file as of 09/25/2018.     Review of Systems  Constitutional: Negative for appetite change and unexpected weight change.  HENT: Positive for congestion and sinus pressure.   Respiratory: Positive for shortness of breath. Negative for cough and chest tightness.   Cardiovascular: Negative for chest pain, palpitations and leg swelling.  Gastrointestinal: Negative for abdominal pain, diarrhea, nausea and vomiting.  Genitourinary: Negative for difficulty urinating and dysuria.  Musculoskeletal: Negative for joint swelling and myalgias.  Skin: Negative for color change and rash.  Neurological: Negative for dizziness, light-headedness and headaches.  Psychiatric/Behavioral: Negative for agitation and dysphoric mood.       Increased stress as outlined.        Objective:    Physical Exam Constitutional:      General: She is not in acute distress.    Appearance: Normal appearance.  HENT:     Nose: Congestion present.     Mouth/Throat:     Pharynx: No oropharyngeal exudate or posterior oropharyngeal erythema.  Neck:     Musculoskeletal: Neck supple. No muscular tenderness.     Thyroid: No thyromegaly.  Cardiovascular:     Rate and Rhythm: Normal rate and regular rhythm.  Pulmonary:     Effort: No respiratory distress.     Breath sounds: Normal breath sounds. No wheezing.  Abdominal:     General: Bowel sounds are normal.     Palpations: Abdomen is soft.     Tenderness: There is no abdominal tenderness.  Musculoskeletal:        General: No swelling or tenderness.  Lymphadenopathy:     Cervical: No cervical adenopathy.  Skin:    Findings: No  erythema or rash.  Neurological:     Mental Status: She is alert.  Psychiatric:        Mood and Affect: Mood normal.        Behavior: Behavior normal.     BP 118/70   Pulse 84   Temp 98.4 F (36.9 C) (Oral)   Resp 16   Wt 233 lb 9.6 oz (106 kg)   LMP 08/27/1998   SpO2 96%   BMI  37.70 kg/m  Wt Readings from Last 3 Encounters:  09/25/18 233 lb 9.6 oz (106 kg)  03/27/18 231 lb 3.2 oz (104.9 kg)  12/04/17 223 lb (101.2 kg)     Lab Results  Component Value Date   WBC 6.2 09/25/2018   HGB 12.4 09/25/2018   HCT 35.7 09/25/2018   PLT 200 09/25/2018   GLUCOSE 104 (H) 09/25/2018   CHOL 191 09/25/2018   TRIG 77 09/25/2018   HDL 49 09/25/2018   LDLCALC 127 (H) 09/25/2018   ALT 53 (H) 09/25/2018   AST 45 (H) 09/25/2018   NA 142 09/25/2018   K 5.0 09/25/2018   CL 104 09/25/2018   CREATININE 0.73 09/25/2018   BUN 12 09/25/2018   CO2 24 09/25/2018   TSH 1.660 03/27/2018   HGBA1C 5.0 03/27/2018    Mm 3d Screen Breast Bilateral  Result Date: 05/22/2018 CLINICAL DATA:  Screening. EXAM: DIGITAL SCREENING BILATERAL MAMMOGRAM WITH TOMO AND CAD COMPARISON:  Previous exam(s). ACR Breast Density Category b: There are scattered areas of fibroglandular density. FINDINGS: There are no findings suspicious for malignancy. Images were processed with CAD. IMPRESSION: No mammographic evidence of malignancy. A result letter of this screening mammogram will be mailed directly to the patient. RECOMMENDATION: Screening mammogram in one year. (Code:SM-B-01Y) BI-RADS CATEGORY  1: Negative. Electronically Signed   By: Britta Mccreedy M.D.   On: 05/22/2018 10:58       Assessment & Plan:   Problem List Items Addressed This Visit    Abnormal liver function tests    Diagnosed with NAFLD.  Seeing GI.  See their note for details.  Recheck liver panel.  Discussed diet, exercise and weight loss.        Anemia    Has seen GI.  Recheck cbc.        Endometriosis    S/p hysterectomy.  Previous pap with  [facility].  Saw Dr Elesa Massed 04/2018.  S/p colposcopy.  Obtain results.        Family history of colonic polyps    Colonoscopy 08/2014.  Recommended f/u in 5 years.       GERD (gastroesophageal reflux disease)    Controlled on protonix.       SOB (shortness of breath) on exertion - Primary    Has noticed increased sob with stairs.  See note.  EKG - SR with no acute ischemic changes.  TWI - III - non specific changes.  Discussed with her today,  Will refer to cardiology for further evaluation and question of need for further cardiac w/up (ex, stress testing, calcium scoring, etc).        Relevant Orders   EKG 12-Lead (Completed)   CBC with Differential/Platelet (Completed)   Hepatic function panel (Completed)   Basic metabolic panel (Completed)   Ambulatory referral to Cardiology    Other Visit Diagnoses    Screening cholesterol level       Relevant Orders   Lipid panel (Completed)       Dale Gadsden, MD

## 2018-09-26 ENCOUNTER — Encounter: Payer: Self-pay | Admitting: Internal Medicine

## 2018-09-26 DIAGNOSIS — R0602 Shortness of breath: Secondary | ICD-10-CM | POA: Insufficient documentation

## 2018-09-26 LAB — BASIC METABOLIC PANEL
BUN/Creatinine Ratio: 16 (ref 9–23)
BUN: 12 mg/dL (ref 6–24)
CHLORIDE: 104 mmol/L (ref 96–106)
CO2: 24 mmol/L (ref 20–29)
CREATININE: 0.73 mg/dL (ref 0.57–1.00)
Calcium: 9.2 mg/dL (ref 8.7–10.2)
GFR calc Af Amer: 111 mL/min/{1.73_m2} (ref >59)
GFR calc non Af Amer: 96 mL/min/{1.73_m2} (ref >59)
Glucose: 104 mg/dL — ABNORMAL HIGH (ref 65–99)
POTASSIUM: 5 mmol/L (ref 3.5–5.2)
SODIUM: 142 mmol/L (ref 134–144)

## 2018-09-26 LAB — LIPID PANEL
Chol/HDL Ratio: 3.9 ratio (ref 0.0–4.4)
Cholesterol, Total: 191 mg/dL (ref 100–199)
HDL: 49 mg/dL (ref >39)
LDL Calculated: 127 mg/dL — ABNORMAL HIGH (ref 0–99)
Triglycerides: 77 mg/dL (ref 0–149)
VLDL CHOLESTEROL CAL: 15 mg/dL (ref 5–40)

## 2018-09-26 LAB — CBC WITH DIFFERENTIAL/PLATELET
BASOS: 1 %
Basophils Absolute: 0 10*3/uL (ref 0.0–0.2)
EOS (ABSOLUTE): 0.1 10*3/uL (ref 0.0–0.4)
EOS: 2 %
HEMATOCRIT: 35.7 % (ref 34.0–46.6)
Hemoglobin: 12.4 g/dL (ref 11.1–15.9)
IMMATURE GRANULOCYTES: 0 %
Immature Grans (Abs): 0 10*3/uL (ref 0.0–0.1)
LYMPHS: 23 %
Lymphocytes Absolute: 1.4 10*3/uL (ref 0.7–3.1)
MCH: 28.5 pg (ref 26.6–33.0)
MCHC: 34.7 g/dL (ref 31.5–35.7)
MCV: 82 fL (ref 79–97)
Monocytes Absolute: 0.3 10*3/uL (ref 0.1–0.9)
Monocytes: 5 %
NEUTROS PCT: 69 %
Neutrophils Absolute: 4.3 10*3/uL (ref 1.4–7.0)
PLATELETS: 200 10*3/uL (ref 150–450)
RBC: 4.35 x10E6/uL (ref 3.77–5.28)
RDW: 13.6 % (ref 11.7–15.4)
WBC: 6.2 10*3/uL (ref 3.4–10.8)

## 2018-09-26 LAB — HEPATIC FUNCTION PANEL
ALT: 53 IU/L — AB (ref 0–32)
AST: 45 IU/L — ABNORMAL HIGH (ref 0–40)
Albumin: 4.3 g/dL (ref 3.8–4.8)
Alkaline Phosphatase: 141 IU/L — ABNORMAL HIGH (ref 39–117)
BILIRUBIN TOTAL: 0.7 mg/dL (ref 0.0–1.2)
BILIRUBIN, DIRECT: 0.16 mg/dL (ref 0.00–0.40)
Total Protein: 7.1 g/dL (ref 6.0–8.5)

## 2018-09-26 NOTE — Assessment & Plan Note (Signed)
S/p hysterectomy.  Previous pap with [facility].  Saw Dr Elesa Massed 04/2018.  S/p colposcopy.  Obtain results.

## 2018-09-26 NOTE — Assessment & Plan Note (Signed)
Controlled on protonix.   

## 2018-09-26 NOTE — Assessment & Plan Note (Signed)
Has seen GI.  Recheck cbc.   

## 2018-09-26 NOTE — Assessment & Plan Note (Signed)
Colonoscopy 08/2014.  Recommended f/u in 5 years.

## 2018-09-26 NOTE — Assessment & Plan Note (Signed)
Diagnosed with NAFLD.  Seeing GI.  See their note for details.  Recheck liver panel.  Discussed diet, exercise and weight loss.

## 2018-09-26 NOTE — Assessment & Plan Note (Signed)
Has noticed increased sob with stairs.  See note.  EKG - SR with no acute ischemic changes.  TWI - III - non specific changes.  Discussed with her today,  Will refer to cardiology for further evaluation and question of need for further cardiac w/up (ex, stress testing, calcium scoring, etc).

## 2018-09-28 ENCOUNTER — Telehealth: Payer: Self-pay | Admitting: Internal Medicine

## 2018-09-28 ENCOUNTER — Other Ambulatory Visit: Payer: Self-pay | Admitting: Internal Medicine

## 2018-09-28 DIAGNOSIS — R739 Hyperglycemia, unspecified: Secondary | ICD-10-CM

## 2018-09-28 DIAGNOSIS — E875 Hyperkalemia: Secondary | ICD-10-CM

## 2018-09-28 NOTE — Telephone Encounter (Signed)
Pt calling about status of cardiology referral.

## 2018-09-28 NOTE — Telephone Encounter (Signed)
Referral placed on Saturday March 7. Referral sent to [facility] on Monday March 9.

## 2018-09-28 NOTE — Telephone Encounter (Signed)
Sorry this pt is checking on cardiology referral.  Coralee North, CMA

## 2018-09-28 NOTE — Progress Notes (Signed)
Order placed for f/u labs.  

## 2018-09-29 ENCOUNTER — Other Ambulatory Visit (INDEPENDENT_AMBULATORY_CARE_PROVIDER_SITE_OTHER): Payer: No Typology Code available for payment source

## 2018-09-29 ENCOUNTER — Telehealth: Payer: Self-pay | Admitting: *Deleted

## 2018-09-29 DIAGNOSIS — R739 Hyperglycemia, unspecified: Secondary | ICD-10-CM

## 2018-09-29 DIAGNOSIS — E875 Hyperkalemia: Secondary | ICD-10-CM

## 2018-09-29 LAB — POTASSIUM: Potassium: 4.4 mmol/L (ref 3.5–5.2)

## 2018-09-29 NOTE — Telephone Encounter (Signed)
Called and spoke with pt and she stated [facility] just called her and scheduled.  Coralee North, CMA

## 2018-09-29 NOTE — Telephone Encounter (Signed)
Copied from CRM [id number]. Topic: General - Call Back - No Documentation >> Sep 29, 2018 12:51 PM Jolayne Haines L wrote: Reason for CRM: pt said she missed a call from the office at around 12:30. No voicemail left. Please advise

## 2018-09-30 NOTE — Telephone Encounter (Signed)
Yes, I spoke with the pt, she wanted to know about her referral I checked with [facility] and, when I called she stated cardiology had just  called her and scheduled her appt I put it in the notes.  Coralee North, CMA

## 2018-10-01 LAB — HEMOGLOBIN A1C
ESTIMATED AVERAGE GLUCOSE: 94 mg/dL
Hgb A1c MFr Bld: 4.9 % (ref 4.8–5.6)

## 2018-10-01 LAB — GLUCOSE, FASTING: Glucose, Plasma: 103 mg/dL — ABNORMAL HIGH (ref 65–99)

## 2018-12-22 ENCOUNTER — Telehealth: Payer: Self-pay

## 2018-12-22 NOTE — Telephone Encounter (Signed)
Copied from CRM [id number]. Topic: Appointment Scheduling - Scheduling Inquiry for Clinic >> Dec 22, 2018 11:13 AM Reggie Pile, NT wrote: Reason for CRM: Patient is calling for an appointment for a possible sinus infection. Call back is [phone].

## 2018-12-23 ENCOUNTER — Other Ambulatory Visit: Payer: Self-pay | Admitting: Internal Medicine

## 2018-12-23 ENCOUNTER — Ambulatory Visit (INDEPENDENT_AMBULATORY_CARE_PROVIDER_SITE_OTHER): Payer: No Typology Code available for payment source | Admitting: Family Medicine

## 2018-12-23 ENCOUNTER — Other Ambulatory Visit: Payer: No Typology Code available for payment source

## 2018-12-23 ENCOUNTER — Other Ambulatory Visit: Payer: Self-pay

## 2018-12-23 ENCOUNTER — Telehealth: Payer: Self-pay | Admitting: Family Medicine

## 2018-12-23 DIAGNOSIS — J019 Acute sinusitis, unspecified: Secondary | ICD-10-CM | POA: Diagnosis not present

## 2018-12-23 DIAGNOSIS — Z20822 Contact with and (suspected) exposure to covid-19: Secondary | ICD-10-CM

## 2018-12-23 DIAGNOSIS — J029 Acute pharyngitis, unspecified: Secondary | ICD-10-CM

## 2018-12-23 DIAGNOSIS — R059 Cough, unspecified: Secondary | ICD-10-CM

## 2018-12-23 DIAGNOSIS — R05 Cough: Secondary | ICD-10-CM | POA: Diagnosis not present

## 2018-12-23 DIAGNOSIS — R6889 Other general symptoms and signs: Secondary | ICD-10-CM | POA: Diagnosis not present

## 2018-12-23 MED ORDER — SULFAMETHOXAZOLE-TRIMETHOPRIM 800-160 MG PO TABS: 1.0000 | ORAL_TABLET | Freq: Two times a day (BID) | ORAL | 0 refills | Status: DC

## 2018-12-23 NOTE — Telephone Encounter (Signed)
Contacted pt to schedule testing; pt offered and accepted appointment at [facility] site 12/23/2018 at 0945; pt given address, directions, and instructions that she and all occupants of her vehicle should wear a mask; she verbalized understanding; orders placed per protocol.

## 2018-12-23 NOTE — Telephone Encounter (Signed)
FYI

## 2018-12-23 NOTE — Progress Notes (Signed)
Patient ID: Katie [name], female   DOB: June 13, 1966, 53 y.o.   MRN: [id number]    Virtual Visit via video Note  This visit type was conducted due to national recommendations for restrictions regarding the COVID-19 pandemic (e.g. social distancing).  This format is felt to be most appropriate for this patient at this time.  All issues noted in this document were discussed and addressed.  No physical exam was performed (except for noted visual exam findings with Video Visits).   I connected with [name] today at  8:20 AM EDT by a video enabled telemedicine application and verified that I am speaking with the correct person using two identifiers. Location patient: home Location provider: [facility] Persons participating in the virtual visit: patient, provider  I discussed the limitations, risks, security and privacy concerns of performing an evaluation and management service by video and the availability of in person appointments. I also discussed with the patient that there may be a patient responsible charge related to this service. The patient expressed understanding and agreed to proceed.   HPI: Patient and I connected via video to discuss sinus pain around teeth and above eyes; And congestion that has been building for 5 or 6 days.  Patient also reports she has developed a cough and some sore throat over the last 1 to 2 days.  Patient has been working from home and denies knowingly being exposed to anyone who is under investigation for who was tested positive for COVID-19.  Denies fever or chills.  Denies shortness of breath or wheezing.  Denies chest pain.  Denies GI or GU complaints.   ROS: See pertinent positives and negatives per HPI.  Past Medical History:  Diagnosis Date  . Anemia   . Endometriosis   . Erosive gastritis   . GERD (gastroesophageal reflux disease)   . History of kidney stones   . Hyperplastic colon polyp   . Irritable bowel syndrome   . Kidney stones    . Migraines   . Nonalcoholic fatty liver disease   . Ovarian cyst   . Positional vertigo    PT helped    Past Surgical History:  Procedure Laterality Date  . ABDOMINAL HYSTERECTOMY  2000   left oophorectomy, Dr Mia Creek  . APPENDECTOMY  2000  . CESAREAN SECTION    . COLONOSCOPY    . ESOPHAGOGASTRODUODENOSCOPY    . EUS N/A 12/04/2017   Procedure: ESOPHAGEAL ENDOSCOPIC ULTRASOUND (EUS) RADIAL;  Surgeon: Bearl Mulberry, MD;  Location: [facility] END[facility]COPY;  Service: Gastroenterology;  Laterality: N/A;  . EXPLORATORY LAPAROTOMY    . IMAGE GUIDED SINUS SURGERY Bilateral 08/29/2016   Procedure: IMAGE GUIDED SINUS SURGERY;  Surgeon: Vernie Murders, MD;  Location: [facility];  Service: ENT;  Laterality: Bilateral;  NEED DISK GAVE DISK TO CECE 1-23 KP  . LITHOTRIPSY    . RIGHT OOPHORECTOMY  2002  . SEPTOPLASTY Bilateral 08/29/2016   Procedure: SEPTOPLASTY;  Surgeon: Vernie Murders, MD;  Location: [facility];  Service: ENT;  Laterality: Bilateral;  . SPHENOIDECTOMY Right 08/29/2016   Procedure: SPHENOIDECTOMY  with removal content right;  Surgeon: Vernie Murders, MD;  Location: [facility];  Service: ENT;  Laterality: Right;  Marland Kitchen VAGINAL BIRTH AFTER CESAREAN SECTION      Family History  Problem Relation Age of Onset  . Hypertension Mother   . Diabetes Mother   . Thyroid cancer Mother   . Colon polyps Mother   . Thyroid cancer Maternal Aunt   .  Colon polyps Maternal Aunt   . Kidney cancer Maternal Uncle   . Breast cancer Neg Hx     Social History   Tobacco Use  . Smoking status: Never Smoker  . Smokeless tobacco: Never Used  Substance Use Topics  . Alcohol use: No    Alcohol/week: 0.0 standard drinks    Current Outpatient Medications:  .  pantoprazole (PROTONIX) 40 MG tablet, Take one tablet 30 minutes before breakfast, Disp: 30 tablet, Rfl: 2 .  Probiotic Product ([facility] [employer] PO), Take by mouth., Disp: , Rfl:   EXAM:  GENERAL: alert, oriented,  appears well and in no acute distress  HEENT: atraumatic, conjunttiva clear, no obvious abnormalities on inspection of external nose and ears  NECK: normal movements of the head and neck  LUNGS: on inspection no signs of respiratory distress, breathing rate appears normal, no obvious gross SOB, gasping or wheezing  CV: no obvious cyanosis  MS: moves all visible extremities without noticeable abnormality  PSYCH/NEURO: pleasant and cooperative, no obvious depression or anxiety, speech and thought processing grossly intact  ASSESSMENT AND PLAN:  Discussed the following assessment and plan:  Acute sinusitis, recurrence not specified, unspecified location - Plan: sulfamethoxazole-trimethoprim (BACTRIM DS) 800-160 MG tablet  Cough  Sore throat  Suspected Covid-19 Virus Infection  Patient symptoms do sound consistent with a sinusitis, we will treat her with Bactrim twice daily for 10 days.  She has been advised to use over-the-counter nasal spray such as a Flonase and saline nasal rinse as well as over-the-counter allergy medicine such as Claritin to help reduce congestion.  Due to development of cough and sore throat, patient potentially could also COVID-19.  We will set her up for testing.  Patient aware that she will be contacted to get appointment time to go to drive-through testing site.  Advised that she must remain home on a self quarantine until we have test results back.  Advised to keep up good fluid intake, use Tylenol if needed for any aches and pains, get plenty of rest and do good handwashing.  Also suggested she can use over-the-counter Robitussin or Mucinex to help reduce cough symptoms.   I discussed the assessment and treatment plan with the patient. The patient was provided an opportunity to ask questions and all were answered. The patient agreed with the plan and demonstrated an understanding of the instructions.   The patient was advised to call back or seek an in-person  evaluation if the symptoms worsen or if the condition fails to improve as anticipated.  Tracey HarriesLauren M Ivyanna Sibert, FNP

## 2018-12-23 NOTE — Telephone Encounter (Signed)
Katie [name]  25-Feb-1966  [id number]  Needs covid test due to sinus congestion, sore throat, cough  [employer], [employer] employee

## 2018-12-25 LAB — NOVEL CORONAVIRUS, NAA: SARS-CoV-2, NAA: NOT DETECTED

## 2018-12-25 LAB — SPECIMEN STATUS REPORT

## 2019-02-23 ENCOUNTER — Other Ambulatory Visit: Payer: Self-pay | Admitting: Gastroenterology

## 2019-02-23 DIAGNOSIS — K760 Fatty (change of) liver, not elsewhere classified: Secondary | ICD-10-CM

## 2019-02-23 DIAGNOSIS — R748 Abnormal levels of other serum enzymes: Secondary | ICD-10-CM

## 2019-03-09 ENCOUNTER — Ambulatory Visit: Payer: No Typology Code available for payment source

## 2019-03-19 ENCOUNTER — Ambulatory Visit
Admission: RE | Admit: 2019-03-19 | Discharge: 2019-03-19 | Disposition: A | Discharge status: Home / Self Care | Payer: PRIVATE HEALTH INSURANCE | Source: Ambulatory Visit | Attending: Gastroenterology | Admitting: Gastroenterology

## 2019-03-19 ENCOUNTER — Other Ambulatory Visit: Payer: Self-pay

## 2019-03-19 DIAGNOSIS — R748 Abnormal levels of other serum enzymes: Secondary | ICD-10-CM | POA: Insufficient documentation

## 2019-03-19 DIAGNOSIS — K760 Fatty (change of) liver, not elsewhere classified: Secondary | ICD-10-CM | POA: Insufficient documentation

## 2019-04-02 ENCOUNTER — Other Ambulatory Visit: Payer: Self-pay

## 2019-04-02 ENCOUNTER — Ambulatory Visit (INDEPENDENT_AMBULATORY_CARE_PROVIDER_SITE_OTHER): Payer: No Typology Code available for payment source | Admitting: Internal Medicine

## 2019-04-02 VITALS — BP 126/70 | HR 88 | Temp 97.9°F | Resp 16 | Wt 233.6 lb

## 2019-04-02 DIAGNOSIS — Z0000 Encounter for general adult medical examination without abnormal findings: Secondary | ICD-10-CM | POA: Diagnosis not present

## 2019-04-02 DIAGNOSIS — R0602 Shortness of breath: Secondary | ICD-10-CM

## 2019-04-02 DIAGNOSIS — R945 Abnormal results of liver function studies: Secondary | ICD-10-CM | POA: Diagnosis not present

## 2019-04-02 DIAGNOSIS — D649 Anemia, unspecified: Secondary | ICD-10-CM

## 2019-04-02 DIAGNOSIS — K219 Gastro-esophageal reflux disease without esophagitis: Secondary | ICD-10-CM

## 2019-04-02 DIAGNOSIS — N809 Endometriosis, unspecified: Secondary | ICD-10-CM

## 2019-04-02 DIAGNOSIS — R7989 Other specified abnormal findings of blood chemistry: Secondary | ICD-10-CM

## 2019-04-02 NOTE — Progress Notes (Signed)
Patient ID: Katie [name], female   DOB: [birth year], 53 y.o.   MRN: [id number]

## 2019-04-02 NOTE — Progress Notes (Signed)
Patient ID: Katie EyeBernice M [name], female   DOB: 09-24-1965, 53 y.o.   MRN: [id number]   Subjective:    Patient ID: Katie [name], female    DOB: 09-24-1965, 53 y.o.   MRN: [id number]  HPI  Patient here for her physical exam.  She reports she is doing relatively well.  Still with increased stress.  Discussed with her today.  Overall she feels she is handling things relatively well.  Is s/p sinus surgery.  Sinuses have done better after surgery.  Previous visit reported some increased sob with exertion - specifically with stairs.  Was referred to cardiology.  Did not schedule appt.  Discussed with her today. She does want to schedule.  Seeing GI.  Last visit 02/23/19.  On protonix.  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  Felt to be benign.  GI reaching out to Dr Shana ChuteBurbridge to confirm if needs f/u EUS now.  She also saw gyn 04/2018 - ASCUS - colposcopy.  Recommended no pap for 3 years.  No abdominal pain.  Bowels moving.     Past Medical History:  Diagnosis Date  . Anemia   . Endometriosis   . Erosive gastritis   . GERD (gastroesophageal reflux disease)   . History of kidney stones   . Hyperplastic colon polyp   . Irritable bowel syndrome   . Kidney stones   . Migraines   . Nonalcoholic fatty liver disease   . Ovarian cyst   . Positional vertigo    PT helped   Past Surgical History:  Procedure Laterality Date  . ABDOMINAL HYSTERECTOMY  2000   left oophorectomy, Dr Mia CreekBerliner  . APPENDECTOMY  2000  . CESAREAN SECTION    . COLONOSCOPY    . ESOPHAGOGASTRODUODENOSCOPY    . EUS N/A 12/04/2017   Procedure: ESOPHAGEAL ENDOSCOPIC ULTRASOUND (EUS) RADIAL;  Surgeon: Bearl MulberryBurbridge, Rebecca A, MD;  [facility];  Service: Gastroenterology;  Laterality: N/A;  . EXPLORATORY LAPAROTOMY    . IMAGE GUIDED SINUS SURGERY Bilateral 08/29/2016   Procedure: IMAGE GUIDED SINUS SURGERY;  Surgeon: Vernie MurdersPaul Juengel, MD;  [facility];  Service: ENT;  Laterality: Bilateral;   NEED DISK GAVE DISK TO CECE 1-23 KP  . LITHOTRIPSY    . RIGHT OOPHORECTOMY  2002  . SEPTOPLASTY Bilateral 08/29/2016   Procedure: SEPTOPLASTY;  Surgeon: Vernie MurdersPaul Juengel, MD;  [facility];  Service: ENT;  Laterality: Bilateral;  . SPHENOIDECTOMY Right 08/29/2016   Procedure: SPHENOIDECTOMY  with removal content right;  Surgeon: Vernie MurdersPaul Juengel, MD;  [facility];  Service: ENT;  Laterality: Right;  Marland Kitchen. VAGINAL BIRTH AFTER CESAREAN SECTION     Family History  Problem Relation Age of Onset  . Hypertension Mother   . Diabetes Mother   . Thyroid cancer Mother   . Colon polyps Mother   . Thyroid cancer Maternal Aunt   . Colon polyps Maternal Aunt   . Kidney cancer Maternal Uncle   . Breast cancer Neg Hx    Social History   Socioeconomic History  . Marital status: Married    Spouse name: Not on file  . Number of children: 2  . Years of education: Not on file  . Highest education level: Not on file  Occupational History  . Not on file  Social Needs  . Financial resource strain: Not on file  . Food insecurity    Worry: Not on file    Inability: Not on file  .  Transportation needs    Medical: Not on file    Non-medical: Not on file  Tobacco Use  . Smoking status: Never Smoker  . Smokeless tobacco: Never Used  Substance and Sexual Activity  . Alcohol use: No    Alcohol/week: 0.0 standard drinks  . Drug use: No  . Sexual activity: Not on file    Comment: Married   Lifestyle  . Physical activity    Days per week: Not on file    Minutes per session: Not on file  . Stress: Not on file  Relationships  . Social Herbalist on phone: Not on file    Gets together: Not on file    Attends religious service: Not on file    Active member of club or organization: Not on file    Attends meetings of clubs or organizations: Not on file    Relationship status: Not on file  Other Topics Concern  . Not on file  Social History Narrative  . Not on file     Outpatient Encounter Medications as of 04/02/2019  Medication Sig  . pantoprazole (PROTONIX) 40 MG tablet Take one tablet 30 minutes before breakfast  . Probiotic Product ([place]) Take by mouth.  . [DISCONTINUED] sulfamethoxazole-trimethoprim (BACTRIM DS) 800-160 MG tablet Take 1 tablet by mouth 2 (two) times daily.   No facility-administered encounter medications on file as of 04/02/2019.     Review of Systems  Constitutional: Negative for appetite change and unexpected weight change.  HENT: Negative for congestion and sinus pressure.   Eyes: Negative for pain and visual disturbance.  Respiratory: Negative for cough and chest tightness.        SOB with exertion - stairs.   Cardiovascular: Negative for chest pain, palpitations and leg swelling.  Gastrointestinal: Negative for abdominal pain, diarrhea, nausea and vomiting.  Genitourinary: Negative for difficulty urinating and dysuria.  Musculoskeletal: Negative for joint swelling and myalgias.  Skin: Negative for color change and rash.  Neurological: Negative for dizziness, light-headedness and headaches.  Hematological: Negative for adenopathy. Does not bruise/bleed easily.  Psychiatric/Behavioral: Negative for agitation and dysphoric mood.       Objective:    Physical Exam Constitutional:      General: She is not in acute distress.    Appearance: Normal appearance. She is well-developed.  HENT:     Right Ear: External ear normal.     Left Ear: External ear normal.  Eyes:     General: No scleral icterus.       Right eye: No discharge.        Left eye: No discharge.     Conjunctiva/sclera: Conjunctivae normal.  Neck:     Musculoskeletal: Neck supple. No muscular tenderness.     Thyroid: No thyromegaly.  Cardiovascular:     Rate and Rhythm: Normal rate and regular rhythm.  Pulmonary:     Effort: No tachypnea, accessory muscle usage or respiratory distress.     Breath sounds: Normal breath sounds. No  decreased breath sounds or wheezing.  Chest:     Breasts:        Right: No inverted nipple, mass, nipple discharge or tenderness (no axillary adenopathy).        Left: No inverted nipple, mass, nipple discharge or tenderness (no axilarry adenopathy).  Abdominal:     General: Bowel sounds are normal.     Palpations: Abdomen is soft.     Tenderness: There is no abdominal tenderness.  Musculoskeletal:        General: No swelling or tenderness.  Lymphadenopathy:     Cervical: No cervical adenopathy.  Skin:    Findings: No erythema or rash.  Neurological:     Mental Status: She is alert and oriented to person, place, and time.  Psychiatric:        Mood and Affect: Mood normal.        Behavior: Behavior normal.     BP 126/70   Pulse 88   Temp 97.9 F (36.6 C)   Resp 16   Wt 233 lb 9.6 oz (106 kg)   LMP 08/27/1998   SpO2 98%   BMI 37.70 kg/m  Wt Readings from Last 3 Encounters:  04/02/19 233 lb 9.6 oz (106 kg)  09/25/18 233 lb 9.6 oz (106 kg)  03/27/18 231 lb 3.2 oz (104.9 kg)     Lab Results  Component Value Date   WBC 6.2 09/25/2018   HGB 12.4 09/25/2018   HCT 35.7 09/25/2018   PLT 200 09/25/2018   GLUCOSE 104 (H) 09/25/2018   CHOL 191 09/25/2018   TRIG 77 09/25/2018   HDL 49 09/25/2018   LDLCALC 127 (H) 09/25/2018   ALT 53 (H) 09/25/2018   AST 45 (H) 09/25/2018   NA 142 09/25/2018   K 4.4 09/29/2018   CL 104 09/25/2018   CREATININE 0.73 09/25/2018   BUN 12 09/25/2018   CO2 24 09/25/2018   TSH 1.660 03/27/2018   HGBA1C 4.9 09/29/2018    [place] Abdomen Complete  Result Date: 03/19/2019 CLINICAL DATA:  Elevated liver enzymes EXAM: ABDOMEN ULTRASOUND COMPLETE COMPARISON:  February 06, 2018 FINDINGS: Gallbladder: No gallstones or wall thickening visualized. There is no pericholecystic fluid. No sonographic Murphy sign noted by sonographer. Common bile duct: Diameter: 3 mm. No intrahepatic, common hepatic, or common bile duct dilatation. Liver: No focal lesion  identified. Liver echogenicity is increased diffusely. Portal vein is patent on color Doppler imaging with normal direction of blood flow towards the liver. IVC: No abnormality visualized. Pancreas: Visualized portion unremarkable. Portions of pancreas obscured by gas. Spleen: Size and appearance within normal limits. Right Kidney: Length: 10.7 cm. Echogenicity within normal limits. No mass or hydronephrosis visualized. Left Kidney: Length: 11.9 cm. Echogenicity within normal limits. No mass or hydronephrosis visualized. Abdominal aorta: No aneurysm visualized. Other findings: No demonstrable ascites. IMPRESSION: 1. Diffuse increase in liver echogenicity, a finding felt to be indicative of hepatic steatosis. No focal liver lesions are evident. 2. Portions of pancreas obscured by gas. Visualized portions of pancreas appear unremarkable. 3.  Study otherwise unremarkable. Electronically Signed   By: Bretta BangWilliam  Woodruff III M.D.   On: 03/19/2019 09:49       Assessment & Plan:   Problem List Items Addressed This Visit    Abnormal liver function tests    Diagnosed with NAFLD.  Seeing GI.  See their note for details.  Continue f/u with GI.        Relevant Orders   Hepatic function panel   Lipid panel   Basic metabolic panel   Anemia    Follow cbc.       Endometriosis    S/p hysterectomy.  Previous pap with ASCUS.  Saw Dr Elesa MassedWard 04/2018.  S/p colposcopy.  Recommended pap - 3 years.        GERD (gastroesophageal reflux disease)    Controlled on protonix.        Health care maintenance    Physical today 04/02/19.  PAP 05/11/18 - through gyn. Per note colposcopy was satisfactory.  ECC was not done.  No biopsies taken.  Per note, recommended f/u pap in 3 years.  Mammogram 05/22/18 - Birads I.        SOB (shortness of breath) on exertion    Has noticed sob with stairs.  Previous EKG - SR with no acute ischemic changes.  TWI - III - non specific changes.  Discussed with her again today.  She was supposed  to see cardiology.  Not scheduled.  Will refer to cardiology for further evaluation and question of need for further w/up.        Relevant Orders   Ambulatory referral to Cardiology       Dale [place], MD

## 2019-04-04 ENCOUNTER — Encounter: Payer: Self-pay | Admitting: Internal Medicine

## 2019-04-04 NOTE — Assessment & Plan Note (Signed)
Physical today 04/02/19.  PAP 05/11/18 - through gyn. Per note colposcopy was satisfactory.  ECC was not done.  No biopsies taken.  Per note, recommended f/u pap in 3 years.  Mammogram 05/22/18 - Birads I.

## 2019-04-04 NOTE — Assessment & Plan Note (Signed)
Diagnosed with NAFLD.  Seeing GI.  See their note for details.  Continue f/u with GI.

## 2019-04-04 NOTE — Assessment & Plan Note (Signed)
Has noticed sob with stairs.  Previous EKG - SR with no acute ischemic changes.  TWI - III - non specific changes.  Discussed with her again today.  She was supposed to see cardiology.  Not scheduled.  Will refer to cardiology for further evaluation and question of need for further w/up.

## 2019-04-04 NOTE — Assessment & Plan Note (Signed)
Controlled on protonix.   

## 2019-04-04 NOTE — Assessment & Plan Note (Signed)
S/p hysterectomy.  Previous pap with ASCUS.  Saw Dr Leonides Schanz 04/2018.  S/p colposcopy.  Recommended pap - 3 years.

## 2019-04-04 NOTE — Assessment & Plan Note (Signed)
Follow cbc.  

## 2019-04-22 ENCOUNTER — Other Ambulatory Visit: Payer: Self-pay | Admitting: Internal Medicine

## 2019-04-22 DIAGNOSIS — Z1231 Encounter for screening mammogram for malignant neoplasm of breast: Secondary | ICD-10-CM

## 2019-05-24 ENCOUNTER — Other Ambulatory Visit: Payer: Self-pay

## 2019-05-24 ENCOUNTER — Ambulatory Visit
Admission: RE | Admit: 2019-05-24 | Discharge: 2019-05-24 | Disposition: A | Discharge status: Home / Self Care | Payer: PRIVATE HEALTH INSURANCE | Source: Ambulatory Visit | Attending: Internal Medicine | Admitting: Internal Medicine

## 2019-05-24 DIAGNOSIS — Z1231 Encounter for screening mammogram for malignant neoplasm of breast: Secondary | ICD-10-CM

## 2019-07-02 ENCOUNTER — Other Ambulatory Visit: Payer: Self-pay

## 2019-07-02 ENCOUNTER — Other Ambulatory Visit (INDEPENDENT_AMBULATORY_CARE_PROVIDER_SITE_OTHER): Payer: No Typology Code available for payment source

## 2019-07-02 DIAGNOSIS — R945 Abnormal results of liver function studies: Secondary | ICD-10-CM | POA: Diagnosis not present

## 2019-07-02 DIAGNOSIS — R7989 Other specified abnormal findings of blood chemistry: Secondary | ICD-10-CM

## 2019-07-02 NOTE — Addendum Note (Signed)
Addended by: Elpidio Galea T on: 07/02/2019 08:17 AM   Modules accepted: Orders

## 2019-07-02 NOTE — Addendum Note (Signed)
Addended by: Tameyah Koch T on: 07/02/2019 08:17 AM   Modules accepted: Orders  

## 2019-07-03 LAB — BASIC METABOLIC PANEL
BUN/Creatinine Ratio: 17 (ref 9–23)
BUN: 12 mg/dL (ref 6–24)
CO2: 21 mmol/L (ref 20–29)
Calcium: 8.7 mg/dL (ref 8.7–10.2)
Chloride: 106 mmol/L (ref 96–106)
Creatinine, Ser: 0.71 mg/dL (ref 0.57–1.00)
GFR calc Af Amer: 114 mL/min/{1.73_m2} (ref >59)
GFR calc non Af Amer: 99 mL/min/{1.73_m2} (ref >59)
Glucose: 102 mg/dL — ABNORMAL HIGH (ref 65–99)
Potassium: 4 mmol/L (ref 3.5–5.2)
Sodium: 140 mmol/L (ref 134–144)

## 2019-07-03 LAB — LIPID PANEL
Chol/HDL Ratio: 3.5 ratio (ref 0.0–4.4)
Cholesterol, Total: 181 mg/dL (ref 100–199)
HDL: 51 mg/dL (ref >39)
LDL Chol Calc (NIH): 113 mg/dL — ABNORMAL HIGH (ref 0–99)
Triglycerides: 91 mg/dL (ref 0–149)
VLDL Cholesterol Cal: 17 mg/dL (ref 5–40)

## 2019-07-03 LAB — HEPATIC FUNCTION PANEL
ALT: 57 IU/L — ABNORMAL HIGH (ref 0–32)
AST: 52 IU/L — ABNORMAL HIGH (ref 0–40)
Albumin: 4 g/dL (ref 3.8–4.9)
Alkaline Phosphatase: 127 IU/L — ABNORMAL HIGH (ref 39–117)
Bilirubin Total: 0.6 mg/dL (ref 0.0–1.2)
Bilirubin, Direct: 0.18 mg/dL (ref 0.00–0.40)
Total Protein: 6.6 g/dL (ref 6.0–8.5)

## 2019-07-05 ENCOUNTER — Encounter: Payer: Self-pay | Admitting: Internal Medicine

## 2019-11-18 ENCOUNTER — Other Ambulatory Visit: Payer: Self-pay | Admitting: Internal Medicine

## 2019-11-18 ENCOUNTER — Encounter: Payer: Self-pay | Admitting: Internal Medicine

## 2019-11-18 LAB — HM COLONOSCOPY

## 2019-11-22 LAB — SURGICAL PATHOLOGY

## 2019-12-23 ENCOUNTER — Other Ambulatory Visit: Payer: Self-pay | Admitting: Gastroenterology

## 2019-12-23 DIAGNOSIS — K760 Fatty (change of) liver, not elsewhere classified: Secondary | ICD-10-CM

## 2020-02-22 ENCOUNTER — Other Ambulatory Visit: Payer: Self-pay

## 2020-02-22 ENCOUNTER — Ambulatory Visit
Admission: RE | Admit: 2020-02-22 | Discharge: 2020-02-22 | Disposition: A | Discharge status: Home / Self Care | Payer: PRIVATE HEALTH INSURANCE | Source: Ambulatory Visit | Attending: Gastroenterology | Admitting: Gastroenterology

## 2020-02-22 DIAGNOSIS — K760 Fatty (change of) liver, not elsewhere classified: Secondary | ICD-10-CM | POA: Insufficient documentation

## 2020-03-03 ENCOUNTER — Encounter: Payer: Self-pay | Admitting: Dietician

## 2020-03-03 ENCOUNTER — Encounter: Payer: No Typology Code available for payment source | Attending: Gastroenterology | Admitting: Dietician

## 2020-03-03 ENCOUNTER — Other Ambulatory Visit: Payer: Self-pay

## 2020-03-03 VITALS — Ht 66.0 in | Wt 235.4 lb

## 2020-03-03 DIAGNOSIS — K760 Fatty (change of) liver, not elsewhere classified: Secondary | ICD-10-CM | POA: Diagnosis not present

## 2020-03-03 NOTE — Progress Notes (Signed)
Medical Nutrition Therapy: Visit start time: 1400  end time: 1530  Assessment:  Diagnosis: NAFLD Past medical history: IBS, GERD Psychosocial issues/ stress concerns: pt rates stress level as "low" and feels "ok" about self stress management  Preferred learning method:  . Auditory . Visual . Hands-on  Current weight: 235.4 lbs  Height: 5'6" Medications, supplements: reconciled in medical record   Progress and evaluation:   Pt enrolled in virtual program with personal trainer for month of July but did not continue due to repetitive meals and lack of accountability check-ins  Pt states she would like to learn how to eat healthy and still enjoy food  Pt states foods that cause IBS symptoms include dairy, corn, and sometimes nuts, lettuce, dark greens; allergic to shellfish  Pt states that she thought 'all carbs were bad'   Pt reports she does most of the cooking but now that it's just her and her husband at home, she wants to make simple meals   Pt states she does not consistently have breakfast   Physical activity: 30-min walking, 3 day/week  Dietary Intake:  Usual eating pattern includes 2-3 meals and 1-2 snacks per day. Dining out frequency: 4 meals per week.  Breakfast: 3 eggs and 2oz sausage; cheese/PB crackers;  Or skips  Snack: almonds, apple and PB, carrots with PB, celery with pimento cheese  Lunch: cheese sticks and sliced [place], craisins,  Supper: grilled/baked chicken, pork chops, green beans/brocoli/asparagus/zucchini or squash, pinto or BEPs; grilled chicken salad  Snack: chocolate, icecream 1 a week   Beverages: cherry pepsi/mountain dew 1 can/day, 16-24 oz water  Nutrition Care Education: Basic nutrition: basic food groups, appropriate nutrient balance, appropriate meal and snack schedule, general nutrition guidelines    Weight control: importance of low sugar and low fat choices, portion control strategies Advanced nutrition:  recipe  modification Mediterranean diet:  healthy and unhealthy fats, role of fiber, plant sterols, role of exercise, identifying high sodium foods Other lifestyle changes:  benefits of making changes, increasing motivation  Nutritional Diagnosis:  NB-1.1 Food and nutrition-related knowledge deficit As related to non-alcoholic fatty liver disease management.  As evidenced by lack of improvement in self management, pt shared concerns about confusion of which foods are healthy and unhealthy.  Intervention:  Pt seems to be eating good variety of foods, not eating frequently enough may be hindering weight loss.  Discussed importance of decreasing sugar-sweetened beverage intake.  Discussed ways to the make the healthy choice the easy choice by meal prepping on the weekends.   Increase fruit and vegetable intake   Incorporate vegetables at lunch and dinner   Continue to eat F/V at snacks  Do not skip breakfast, include whole fruit at breakfast    Decrease sugar-sweetened beverage consumption   Switch from sweet to unsweet tea with sugar substitute   Switch from regular to diet soda  Drink at least 64oz water daily (add crystal light, lemon, or mio as needed)    Stress management (at follow up)  Will mention pastoral care  Increase quality of sleep, at follow up discuss link between sleep and weight management   At follow up, discuss Mindful Eating handout    Increase physical activity   Gradually increase from 3 to 5 days a week   150 minutes per week is recommendation    Increase fiber intake   Eat at least 3 servings of whole grains a day  Increase fruit and vegetable intake  Education Materials given:  Marland Kitchen Mediterranean  diet handout  . [employer] mediterranean diet handout  . Recipes- [employer].org . Goals/ instructions  Learner/ who was taught:  . Patient   Level of understanding: Marland Kitchen Verbalizes/ demonstrates competency  Demonstrated degree of understanding via:   Teach  back Learning barriers: . None  Willingness to learn/ readiness for change: . Eager, change in progress  Monitoring and Evaluation:  Dietary intake, exercise, and body weight      follow up: October 1 at 2p

## 2020-03-03 NOTE — Patient Instructions (Addendum)
   Increase water intake to 4-6 cups/day  Try sparkling waters or adding sugar free flavor to your water   Include complex carbs in your diet (whole grain pastas/breads, beans, fruits/vegetables)  Try to eat breakfast everyday   Keep up your walking 3 times a week

## 2020-04-07 ENCOUNTER — Ambulatory Visit (INDEPENDENT_AMBULATORY_CARE_PROVIDER_SITE_OTHER): Payer: No Typology Code available for payment source | Admitting: Internal Medicine

## 2020-04-07 ENCOUNTER — Encounter: Payer: Self-pay | Admitting: Internal Medicine

## 2020-04-07 ENCOUNTER — Other Ambulatory Visit: Payer: Self-pay

## 2020-04-07 VITALS — BP 128/74 | HR 114 | Temp 98.5°F | Ht 65.98 in | Wt 236.4 lb

## 2020-04-07 DIAGNOSIS — R945 Abnormal results of liver function studies: Secondary | ICD-10-CM | POA: Diagnosis not present

## 2020-04-07 DIAGNOSIS — Z23 Encounter for immunization: Secondary | ICD-10-CM

## 2020-04-07 DIAGNOSIS — Z862 Personal history of diseases of the blood and blood-forming organs and certain disorders involving the immune mechanism: Secondary | ICD-10-CM | POA: Diagnosis not present

## 2020-04-07 DIAGNOSIS — Z0001 Encounter for general adult medical examination with abnormal findings: Secondary | ICD-10-CM

## 2020-04-07 DIAGNOSIS — Z0000 Encounter for general adult medical examination without abnormal findings: Secondary | ICD-10-CM

## 2020-04-07 DIAGNOSIS — Z86010 Personal history of colonic polyps: Secondary | ICD-10-CM

## 2020-04-07 DIAGNOSIS — K219 Gastro-esophageal reflux disease without esophagitis: Secondary | ICD-10-CM

## 2020-04-07 DIAGNOSIS — R7989 Other specified abnormal findings of blood chemistry: Secondary | ICD-10-CM

## 2020-04-07 NOTE — Progress Notes (Signed)
Patient ID: Katie [name], female   DOB: January 21, 1966, 54 y.o.   MRN: [id number]   Subjective:    Patient ID: Katie [name], female    DOB: January 21, 1966, 54 y.o.   MRN: [id number]  HPI This visit occurred during the SARS-CoV-2 public health emergency.  Safety protocols were in place, including screening questions prior to the visit, additional usage of staff PPE, and extensive cleaning of exam room while observing appropriate contact time as indicated for disinfecting solutions.  Patient here for her physical exam.  She reports she is doing relatively well.  Working from home.  Seeing a chiropractor now.  Helping.  Discussed getting up and moving around/exercise.  No chest pain or sob reported.  No abdominal pain or bowel change reported.  Seeing GI - f/u fatty liver.  Just evaluated 03/23/20. Stable.  Recommended f/u in 3 months.  Recently evaluated by cardiology for sob with exertion.  Stress ECHO 04/23/19 -  No evidence of ischemia.  EF 55%.  Recommended f/u in 6 months.  She feels things are stable.  No acute change or issues.     Past Medical History:  Diagnosis Date  . Anemia   . Endometriosis   . Erosive gastritis   . GERD (gastroesophageal reflux disease)   . History of kidney stones   . Hyperplastic colon polyp   . Irritable bowel syndrome   . Kidney stones   . Migraines   . Nonalcoholic fatty liver disease   . Ovarian cyst   . Positional vertigo    PT helped   Past Surgical History:  Procedure Laterality Date  . ABDOMINAL HYSTERECTOMY  2000   left oophorectomy, Dr Mia [place]  . APPENDECTOMY  2000  . CESAREAN SECTION    . COLONOSCOPY    . ESOPHAGOGASTRODUODENOSCOPY    . EUS N/A 12/04/2017   Procedure: ESOPHAGEAL ENDOSCOPIC ULTRASOUND (EUS) RADIAL;  Surgeon: Bearl MulberryBurbridge, Rebecca A, MD;  [facility];  Service: Gastroenterology;  Laterality: N/A;  . EXPLORATORY LAPAROTOMY    . IMAGE GUIDED SINUS SURGERY Bilateral 08/29/2016   Procedure: IMAGE GUIDED SINUS SURGERY;   Surgeon: Vernie MurdersPaul Juengel, MD;  [facility];  Service: ENT;  Laterality: Bilateral;  NEED DISK GAVE DISK TO CECE 1-23 KP  . LITHOTRIPSY    . RIGHT OOPHORECTOMY  2002  . SEPTOPLASTY Bilateral 08/29/2016   Procedure: SEPTOPLASTY;  Surgeon: Vernie MurdersPaul Juengel, MD;  [facility];  Service: ENT;  Laterality: Bilateral;  . SPHENOIDECTOMY Right 08/29/2016   Procedure: SPHENOIDECTOMY  with removal content right;  Surgeon: Vernie MurdersPaul Juengel, MD;  [facility];  Service: ENT;  Laterality: Right;  Marland Kitchen. VAGINAL BIRTH AFTER CESAREAN SECTION     Family History  Problem Relation Age of Onset  . Hypertension Mother   . Diabetes Mother   . Thyroid cancer Mother   . Colon polyps Mother   . Thyroid cancer Maternal Aunt   . Colon polyps Maternal Aunt   . Kidney cancer Maternal Uncle   . Breast cancer Neg Hx    Social History   Socioeconomic History  . Marital status: Married    Spouse name: Not on file  . Number of children: 2  . Years of education: Not on file  . Highest education level: Not on file  Occupational History  . Not on file  Tobacco Use  . Smoking status: Never Smoker  . Smokeless tobacco: Never Used  Vaping Use  . Vaping Use: Never used  Substance and  Sexual Activity  . Alcohol use: No    Alcohol/week: 0.0 standard drinks  . Drug use: No  . Sexual activity: Not on file    Comment: Married   Other Topics Concern  . Not on file  Social History Narrative  . Not on file   Social Determinants of Health   Financial Resource Strain:   . Difficulty of Paying Living Expenses: Not on file  Food Insecurity:   . Worried About Programme researcher, broadcasting/film/video in the Last Year: Not on file  . Ran Out of Food in the Last Year: Not on file  Transportation Needs:   . Lack of Transportation (Medical): Not on file  . Lack of Transportation (Non-Medical): Not on file  Physical Activity:   . Days of Exercise per Week: Not on file  . Minutes of Exercise per Session:  Not on file  Stress:   . Feeling of Stress : Not on file  Social Connections:   . Frequency of Communication with Friends and Family: Not on file  . Frequency of Social Gatherings with Friends and Family: Not on file  . Attends Religious Services: Not on file  . Active Member of Clubs or Organizations: Not on file  . Attends Banker Meetings: Not on file  . Marital Status: Not on file    Outpatient Encounter Medications as of 04/07/2020  Medication Sig  . Methylcellulose, Laxative, 500 MG TABS Take by mouth.  . Multiple Vitamins-Minerals (WOMENS MULTIVITAMIN PO) Take by mouth.  . pantoprazole (PROTONIX) 40 MG tablet Take one tablet 30 minutes before breakfast  . Probiotic Product (PHILLIPS COLON HEALTH PO) Take by mouth.   No facility-administered encounter medications on file as of 04/07/2020.    Review of Systems  Constitutional: Negative for appetite change and unexpected weight change.  HENT: Negative for congestion and sinus pressure.   Eyes: Negative for pain and visual disturbance.  Respiratory: Negative for cough, chest tightness and shortness of breath.   Cardiovascular: Negative for chest pain, palpitations and leg swelling.  Gastrointestinal: Negative for abdominal pain, diarrhea, nausea and vomiting.  Genitourinary: Negative for difficulty urinating and dysuria.  Musculoskeletal: Negative for joint swelling and myalgias.  Skin: Negative for color change and rash.  Neurological: Negative for dizziness, light-headedness and headaches.  Hematological: Negative for adenopathy. Does not bruise/bleed easily.  Psychiatric/Behavioral: Negative for agitation and dysphoric mood.       Objective:    Physical Exam Vitals reviewed.  Constitutional:      General: She is not in acute distress.    Appearance: Normal appearance. She is well-developed.  HENT:     Head: Normocephalic and atraumatic.     Right Ear: External ear normal.     Left Ear: External ear  normal.  Eyes:     General: No scleral icterus.       Right eye: No discharge.        Left eye: No discharge.     Conjunctiva/sclera: Conjunctivae normal.  Neck:     Thyroid: No thyromegaly.  Cardiovascular:     Rate and Rhythm: Normal rate and regular rhythm.  Pulmonary:     Effort: No tachypnea, accessory muscle usage or respiratory distress.     Breath sounds: Normal breath sounds. No decreased breath sounds or wheezing.  Chest:     Breasts:        Right: No inverted nipple, mass, nipple discharge or tenderness (no axillary adenopathy).  Left: No inverted nipple, mass, nipple discharge or tenderness (no axilarry adenopathy).  Abdominal:     General: Bowel sounds are normal.     Palpations: Abdomen is soft.     Tenderness: There is no abdominal tenderness.  Musculoskeletal:        General: No swelling or tenderness.     Cervical back: Neck supple. No tenderness.  Lymphadenopathy:     Cervical: No cervical adenopathy.  Skin:    Findings: No erythema or rash.  Neurological:     Mental Status: She is alert and oriented to person, place, and time.  Psychiatric:        Mood and Affect: Mood normal.        Behavior: Behavior normal.     BP 128/74 (BP Location: Left Arm, Patient Position: Sitting)   Pulse (!) 114   Temp 98.5 F (36.9 C)   Ht 5' 5.98" (1.676 m)   Wt 236 lb 6.4 oz (107.2 kg)   LMP 08/27/1998   SpO2 98%   BMI 38.17 kg/m  Wt Readings from Last 3 Encounters:  04/07/20 236 lb 6.4 oz (107.2 kg)  03/03/20 235 lb 6.4 oz (106.8 kg)  04/02/19 233 lb 9.6 oz (106 kg)     Lab Results  Component Value Date   WBC 8.0 04/07/2020   HGB 12.8 04/07/2020   HCT 37.8 04/07/2020   PLT 208 04/07/2020   GLUCOSE 78 04/07/2020   CHOL 198 04/07/2020   TRIG 105 04/07/2020   HDL 48 04/07/2020   LDLCALC 131 (H) 04/07/2020   ALT 55 (H) 04/07/2020   AST 50 (H) 04/07/2020   NA 142 04/07/2020   K 4.2 04/07/2020   CL 103 04/07/2020   CREATININE 0.74 04/07/2020   BUN  13 04/07/2020   CO2 24 04/07/2020   TSH 1.620 04/07/2020   HGBA1C 4.9 09/29/2018    US Abdomen Complete  Result Date: 02/22/2020 CLINICAL DATA:  Nonalcoholic fatty liver. EXAM: ABDOMEN ULTRASOUND COMPLETE COMPARISON:  March 19, 2019. FINDINGS: Gallbladder: No gallstones or wall thickening visualized. No sonographic Murphy sign noted by sonographer. Common bile duct: Diameter: 5 mm which is within normal limits. Liver: No focal lesion identified. Increased echogenicity of hepatic parenchyma is noted suggesting hepatic steatosis. Portal vein is patent on color Doppler imaging with normal direction of blood flow towards the liver. IVC: No abnormality visualized. Pancreas: Visualized portion unremarkable. Spleen: Size and appearance within normal limits. Right Kidney: Length: 10.2 cm. Echogenicity within normal limits. No mass or hydronephrosis visualized. Left Kidney: Length: 11.5 cm. Echogenicity within normal limits. No mass or hydronephrosis visualized. Abdominal aorta: No aneurysm visualized. Other findings: None. IMPRESSION: Increased echogenicity of hepatic parenchyma is noted suggesting hepatic steatosis. No other abnormality is noted in the abdomen. Electronically Signed   By: Lupita Raider M.D.   On: 02/22/2020 10:54       Assessment & Plan:   Problem List Items Addressed This Visit    History of colonic polyps    Colonoscopy 11/18/19 - recommended f/u colonoscopy in 5 years.       History of anemia    Follow cbc.       Relevant Orders   CBC with Differential/Platelet (Completed)   Health care maintenance    Physical today 04/07/20.  PAP 05/11/18 - through gyn.  Recommended f/u PAP in 3 years.  Mammogram 05/25/19 - Birads I.        GERD (gastroesophageal reflux disease)    No upper symptoms  reported.  Protonix.       Relevant Medications   Methylcellulose, Laxative, 500 MG TABS   Abnormal liver function tests    Diagnosed with NAFLD.  Seeing GI.  Diet, exercise and weight  loss.  Last evaluated 03/23/20.  Recheck liver panel to confirm stable.  Keep f/u with GI.       Relevant Orders   Comprehensive metabolic panel (Completed)   Lipid panel (Completed)   TSH (Completed)    Other Visit Diagnoses    Encounter for general adult medical examination with abnormal findings    -  Primary   Need for immunization against influenza       Relevant Orders   Flu Vaccine QUAD 36+ mos IM (Completed)       Dale Harvey, MD

## 2020-04-08 ENCOUNTER — Encounter: Payer: Self-pay | Admitting: Internal Medicine

## 2020-04-08 LAB — CBC WITH DIFFERENTIAL/PLATELET
Basophils Absolute: 0 10*3/uL (ref 0.0–0.2)
Basos: 1 %
EOS (ABSOLUTE): 0.2 10*3/uL (ref 0.0–0.4)
Eos: 2 %
Hematocrit: 37.8 % (ref 34.0–46.6)
Hemoglobin: 12.8 g/dL (ref 11.1–15.9)
Immature Grans (Abs): 0 10*3/uL (ref 0.0–0.1)
Immature Granulocytes: 0 %
Lymphocytes Absolute: 1.8 10*3/uL (ref 0.7–3.1)
Lymphs: 22 %
MCH: 28.9 pg (ref 26.6–33.0)
MCHC: 33.9 g/dL (ref 31.5–35.7)
MCV: 85 fL (ref 79–97)
Monocytes Absolute: 0.4 10*3/uL (ref 0.1–0.9)
Monocytes: 5 %
Neutrophils Absolute: 5.6 10*3/uL (ref 1.4–7.0)
Neutrophils: 70 %
Platelets: 208 10*3/uL (ref 150–450)
RBC: 4.43 x10E6/uL (ref 3.77–5.28)
RDW: 13.6 % (ref 11.7–15.4)
WBC: 8 10*3/uL (ref 3.4–10.8)

## 2020-04-08 LAB — COMPREHENSIVE METABOLIC PANEL
ALT: 55 IU/L — ABNORMAL HIGH (ref 0–32)
AST: 50 IU/L — ABNORMAL HIGH (ref 0–40)
Albumin/Globulin Ratio: 2 (ref 1.2–2.2)
Albumin: 4.7 g/dL (ref 3.8–4.9)
Alkaline Phosphatase: 131 IU/L — ABNORMAL HIGH (ref 44–121)
BUN/Creatinine Ratio: 18 (ref 9–23)
BUN: 13 mg/dL (ref 6–24)
Bilirubin Total: 0.6 mg/dL (ref 0.0–1.2)
CO2: 24 mmol/L (ref 20–29)
Calcium: 9.4 mg/dL (ref 8.7–10.2)
Chloride: 103 mmol/L (ref 96–106)
Creatinine, Ser: 0.74 mg/dL (ref 0.57–1.00)
GFR calc Af Amer: 108 mL/min/{1.73_m2} (ref >59)
GFR calc non Af Amer: 93 mL/min/{1.73_m2} (ref >59)
Globulin, Total: 2.3 g/dL (ref 1.5–4.5)
Glucose: 78 mg/dL (ref 65–99)
Potassium: 4.2 mmol/L (ref 3.5–5.2)
Sodium: 142 mmol/L (ref 134–144)
Total Protein: 7 g/dL (ref 6.0–8.5)

## 2020-04-08 LAB — LIPID PANEL
Chol/HDL Ratio: 4.1 ratio (ref 0.0–4.4)
Cholesterol, Total: 198 mg/dL (ref 100–199)
HDL: 48 mg/dL (ref >39)
LDL Chol Calc (NIH): 131 mg/dL — ABNORMAL HIGH (ref 0–99)
Triglycerides: 105 mg/dL (ref 0–149)
VLDL Cholesterol Cal: 19 mg/dL (ref 5–40)

## 2020-04-08 LAB — TSH: TSH: 1.62 u[IU]/mL (ref 0.450–4.500)

## 2020-04-08 NOTE — Assessment & Plan Note (Signed)
No upper symptoms reported.  Protonix.  

## 2020-04-08 NOTE — Assessment & Plan Note (Signed)
Physical today 04/07/20.  PAP 05/11/18 - through gyn.  Recommended f/u PAP in 3 years.  Mammogram 05/25/19 - Birads I.

## 2020-04-08 NOTE — Assessment & Plan Note (Signed)
Colonoscopy 11/18/19 - recommended f/u colonoscopy in 5 years.

## 2020-04-08 NOTE — Assessment & Plan Note (Signed)
Follow cbc.  

## 2020-04-08 NOTE — Assessment & Plan Note (Signed)
Diagnosed with NAFLD.  Seeing GI.  Diet, exercise and weight loss.  Last evaluated 03/23/20.  Recheck liver panel to confirm stable.  Keep f/u with GI.

## 2020-04-21 ENCOUNTER — Ambulatory Visit: Payer: No Typology Code available for payment source | Admitting: Dietician

## 2020-04-24 ENCOUNTER — Other Ambulatory Visit: Payer: Self-pay

## 2020-04-24 ENCOUNTER — Encounter: Payer: Self-pay | Admitting: Dietician

## 2020-04-24 ENCOUNTER — Encounter: Payer: No Typology Code available for payment source | Attending: Gastroenterology | Admitting: Dietician

## 2020-04-24 VITALS — Wt 238.0 lb

## 2020-04-24 DIAGNOSIS — K760 Fatty (change of) liver, not elsewhere classified: Secondary | ICD-10-CM | POA: Diagnosis present

## 2020-04-24 NOTE — Progress Notes (Signed)
Medical Nutrition Therapy: Visit start time: 1630  end time: 1715  Follow up:  Diagnosis: NAFLD Medical history changes: none Psychosocial issues/ stress concerns: none  Current weight: 238.0 lbs  Height: 5'6" Medications, supplement changes: reconciled in medical record   Progress and evaluation:  . Pt has made progress on some of the goals set in the initial visit o Eating breakfast 3-4 days a week; less sugar in oatmeal  o Drinking more water throughout the day, up to 2-3 bottles of water now  o Tried at least two different recipes from the websites and resources discussed  o Continuing to use less butter when cooking and no fried foods at home  . Pt has enrolled in [employer] (weight loss program)- program instructs clients to eat every two hours from their product line of bars, shakes, smoothies, snack foods; she has 30-day trial; includes 'maintenance phase' guidance o Pt thinks the structure of the plan will take the guess work out of what to eat    o Pt also states the accountability component will be very helpful for her; access to a health coach  . Pt reports using less butter and no fried foods at home . Pt states she would like to        Physical activity: pt states she has not been able to maintain her walking for 30 min a day, 3 days a week due to hot weather   Dietary Intake:  Usual eating pattern includes 3 meals and 0-1 snacks per day. Dining out frequency:  No change  Breakfast (3-4): [place] bacon; oatmeal  Snack: apples and PB, celery and PB,  Lunch: leftovers (3oz steak and half potato); 2 skewers of chicken and salad   Snack: same as above  Supper: multi-grain pasta wit chicken and [place] dressing; shredded chicken with brown rice; shredded chicken in tortilla   Beverages: 32 - 48 oz water/day  Nutrition Care Education: Basic nutrition: basic food groups, appropriate nutrient balance, appropriate meal and snack schedule, general nutrition guidelines    Weight  control: reviewed progress since previous visit, determining reasonable weight loss rate, portion control strategies, tracking food intake Advanced nutrition:  recipe modification, cooking techniques, dining out, food label reading Other lifestyle changes:  benefits of making changes  Nutritional Diagnosis: NB-1.1 Food and nutrition-related knowledge deficit As related to non-alcoholic fatty liver disease management.  As evidenced by lack of improvement in self management, pt shared concerns about confusion of which foods are healthy and unhealthy.  Intervention:  Discussion and instruction as noted above.  Discussed some of the pros and cons of programs like [employer].  Reviewed importance of stress management techniques especially when entering a structured program like [employer].  Reviewed markers of success in addition to tracking weight.  Offered pastoral care resource to patient; patient stated she has a women's group that she meets with every other week.    Increase fruit and vegetable intake   Incorporate vegetables at lunch and dinner   Continue to eat F/V at snacks  Do not skip breakfast, include whole fruit at breakfast   Decrease sugar-sweetened beverage consumption   Switch from sweet to unsweet tea with sugar substitute   Switch from regular to diet soda  Drink at least 64oz water daily (add crystal light, lemon, or mio as needed)   Stress management (at follow up)  Will mention pastoral care  Increase quality of sleep, at follow up discuss link between sleep and weight management   At follow  up, discuss Mindful Eating handout   Increase physical activity   Gradually increase from 3 to 5 days a week   150 minutes per week is recommendation  Increase fiber intake   Eat at least 3 servings of whole grains a day  Increase fruit and vegetable intake  Education Materials given:  Marland Kitchen Stress management handout   Learner/ who was taught:  . Patient   Level of  understanding: Marland Kitchen Verbalizes/ demonstrates competency  Demonstrated degree of understanding via:   Teach back Learning barriers: . None  Willingness to learn/ readiness for change: . Eager, change in progress  Monitoring and Evaluation:  Dietary intake, exercise, and body weight      follow up: prn

## 2020-05-11 ENCOUNTER — Other Ambulatory Visit: Payer: Self-pay | Admitting: Internal Medicine

## 2020-05-11 DIAGNOSIS — Z1231 Encounter for screening mammogram for malignant neoplasm of breast: Secondary | ICD-10-CM

## 2020-05-30 ENCOUNTER — Other Ambulatory Visit: Payer: Self-pay

## 2020-05-30 ENCOUNTER — Ambulatory Visit
Admission: RE | Admit: 2020-05-30 | Discharge: 2020-05-30 | Disposition: A | Discharge status: Home / Self Care | Payer: No Typology Code available for payment source | Source: Ambulatory Visit | Attending: Internal Medicine | Admitting: Internal Medicine

## 2020-05-30 DIAGNOSIS — Z1231 Encounter for screening mammogram for malignant neoplasm of breast: Secondary | ICD-10-CM

## 2020-06-19 ENCOUNTER — Other Ambulatory Visit: Payer: Self-pay

## 2020-06-19 ENCOUNTER — Telehealth (INDEPENDENT_AMBULATORY_CARE_PROVIDER_SITE_OTHER): Payer: No Typology Code available for payment source | Admitting: Internal Medicine

## 2020-06-19 VITALS — Temp 97.6°F | Wt 219.0 lb

## 2020-06-19 DIAGNOSIS — R0981 Nasal congestion: Secondary | ICD-10-CM | POA: Diagnosis not present

## 2020-06-19 NOTE — Progress Notes (Signed)
Patient ID: Katie [name], female   DOB: May 10, 1966, 54 y.o.   MRN: [id number]   Virtual Visit via video Note  This visit type was conducted due to national recommendations for restrictions regarding the COVID-19 pandemic (e.g. social distancing).  This format is felt to be most appropriate for this patient at this time.  All issues noted in this document were discussed and addressed.  No physical exam was performed (except for noted visual exam findings with Video Visits).   I connected with Katie [name] by a video enabled telemedicine application and verified that I am speaking with the correct person using two identifiers. Location patient: home Location provider: work  Persons participating in the virtual visit: patient, provider  The limitations, risks, security and privacy concerns of performing an evaluation and management service by video and the availability of in person appointments have been discussed.  It has also been discussed with the patient that there may be a patient responsible charge related to this service. The patient expressed understanding and agreed to proceed.   Reason for visit: work in appt.   HPI: Work in for sinus congestion, sinus pressure and sore throat.  Went into various shops recently.  Noticed some increased sneezing.  Also around smoke.  States symptoms started - sore throat.  Head has felt full.  Increased sinus pressure.  Some cough.  Has taken tylenol sinus/sudafed.  Works from home.  Daughter - covid negative.  Diagnosed with sinus infection.  Is supposed to take her mother for renal biopsy in a few days.  Has adjusted diet and lost weight.  No vomiting reported.    ROS: See pertinent positives and negatives per HPI.  Past Medical History:  Diagnosis Date  . Anemia   . Endometriosis   . Erosive gastritis   . GERD (gastroesophageal reflux disease)   . History of kidney stones   . Hyperplastic colon polyp   . Irritable bowel syndrome   . Kidney  stones   . Migraines   . Nonalcoholic fatty liver disease   . Ovarian cyst   . Positional vertigo    PT helped    Past Surgical History:  Procedure Laterality Date  . ABDOMINAL HYSTERECTOMY  2000   left oophorectomy, Dr Mia Creek  . APPENDECTOMY  2000  . CESAREAN SECTION    . COLONOSCOPY    . ESOPHAGOGASTRODUODENOSCOPY    . EUS N/A 12/04/2017   Procedure: ESOPHAGEAL ENDOSCOPIC ULTRASOUND (EUS) RADIAL;  Surgeon: Bearl Mulberry, MD;  Location: [facility] END[facility];  Service: Gastroenterology;  Laterality: N/A;  . EXPLORATORY LAPAROTOMY    . IMAGE GUIDED SINUS SURGERY Bilateral 08/29/2016   Procedure: IMAGE GUIDED SINUS SURGERY;  Surgeon: Vernie Murders, MD;  Location: [facility];  Service: ENT;  Laterality: Bilateral;  NEED DISK GAVE DISK TO CECE 1-23 KP  . LITHOTRIPSY    . RIGHT OOPHORECTOMY  2002  . SEPTOPLASTY Bilateral 08/29/2016   Procedure: SEPTOPLASTY;  Surgeon: Vernie Murders, MD;  Location: [facility];  Service: ENT;  Laterality: Bilateral;  . SPHENOIDECTOMY Right 08/29/2016   Procedure: SPHENOIDECTOMY  with removal content right;  Surgeon: Vernie Murders, MD;  Location: [facility];  Service: ENT;  Laterality: Right;  Marland Kitchen VAGINAL BIRTH AFTER CESAREAN SECTION      Family History  Problem Relation Age of Onset  . Hypertension Mother   . Diabetes Mother   . Thyroid cancer Mother   . Colon polyps Mother   . Thyroid cancer Maternal Aunt   .  Colon polyps Maternal Aunt   . Kidney cancer Maternal Uncle   . Breast cancer Neg Hx     SOCIAL HX: reviewed.    Current Outpatient Medications:  Marland Kitchen  Methylcellulose, Laxative, 500 MG TABS, Take by mouth., Disp: , Rfl:  .  Multiple Vitamins-Minerals (WOMENS MULTIVITAMIN PO), Take by mouth., Disp: , Rfl:  .  pantoprazole (PROTONIX) 40 MG tablet, Take one tablet 30 minutes before breakfast, Disp: 30 tablet, Rfl: 2 .  Probiotic Product (PHILLIPS COLON HEALTH PO), Take by mouth., Disp: , Rfl:   EXAM:  GENERAL: alert,  oriented, appears well and in no acute distress  HEENT: atraumatic, conjunttiva clear, no obvious abnormalities on inspection of external nose and ears  NECK: normal movements of the head and neck  LUNGS: on inspection no signs of respiratory distress, breathing rate appears normal, no obvious gross SOB, gasping or wheezing  CV: no obvious cyanosis  PSYCH/NEURO: pleasant and cooperative, no obvious depression or anxiety, speech and thought processing grossly intact  ASSESSMENT AND PLAN:  Discussed the following assessment and plan:  Problem List Items Addressed This Visit    Sinus congestion    Increased head congestion and sinus pressure.  Some cough.  Discussed covid testing.  She is agreeable.  Will arrange for her to come by the office tomorrow to be tested.  Steroid nasal spray and saline nasal spray as directed.  Can use robitussin DM if needed.  Follow closely. Send in update.         Other Visit Diagnoses    Nasal congestion    -  Primary   Relevant Orders   Novel Coronavirus, NAA ([facility]) (Completed)       I discussed the assessment and treatment plan with the patient. The patient was provided an opportunity to ask questions and all were answered. The patient agreed with the plan and demonstrated an understanding of the instructions.   The patient was advised to call back or seek an in-person evaluation if the symptoms worsen or if the condition fails to improve as anticipated.   Dale Santaquin, MD

## 2020-06-20 ENCOUNTER — Other Ambulatory Visit: Payer: PRIVATE HEALTH INSURANCE

## 2020-06-20 DIAGNOSIS — R0981 Nasal congestion: Secondary | ICD-10-CM

## 2020-06-22 LAB — SARS-COV-2, NAA 2 DAY TAT

## 2020-06-22 LAB — NOVEL CORONAVIRUS, NAA: SARS-CoV-2, NAA: NOT DETECTED

## 2020-07-02 ENCOUNTER — Encounter: Payer: Self-pay | Admitting: Internal Medicine

## 2020-07-02 DIAGNOSIS — R0981 Nasal congestion: Secondary | ICD-10-CM | POA: Insufficient documentation

## 2020-07-02 NOTE — Assessment & Plan Note (Signed)
Increased head congestion and sinus pressure.  Some cough.  Discussed covid testing.  She is agreeable.  Will arrange for her to come by the office tomorrow to be tested.  Steroid nasal spray and saline nasal spray as directed.  Can use robitussin DM if needed.  Follow closely. Send in update.

## 2020-09-21 ENCOUNTER — Other Ambulatory Visit: Payer: Self-pay | Admitting: Gastroenterology

## 2020-09-21 DIAGNOSIS — K760 Fatty (change of) liver, not elsewhere classified: Secondary | ICD-10-CM

## 2020-09-21 LAB — CBC AND DIFFERENTIAL
HCT: 39 (ref 36–46)
Hemoglobin: 12.9 (ref 12.0–16.0)
Neutrophils Absolute: 4.9
Platelets: 194 (ref 150–399)
WBC: 6.6

## 2020-09-21 LAB — HEPATIC FUNCTION PANEL
ALT: 28 (ref 7–35)
AST: 25 (ref 13–35)
Alkaline Phosphatase: 132 — AB (ref 25–125)
Bilirubin, Direct: 0.15 (ref 0.01–0.4)
Bilirubin, Total: 0.5

## 2020-09-21 LAB — COMPREHENSIVE METABOLIC PANEL
Albumin: 4.6 (ref 3.5–5.0)
Calcium: 9.2 (ref 8.7–10.7)

## 2020-09-21 LAB — BASIC METABOLIC PANEL
BUN: 13 (ref 4–21)
CO2: 20 (ref 13–22)
Chloride: 104 (ref 99–108)
Creatinine: 0.8 (ref 0.5–1.1)
Glucose: 94
Potassium: 4.4 (ref 3.4–5.3)
Sodium: 143 (ref 137–147)

## 2020-09-21 LAB — CBC: RBC: 4.55 (ref 3.87–5.11)

## 2020-09-21 LAB — TSH: TSH: 1.28 (ref 0.41–5.90)

## 2020-10-05 ENCOUNTER — Encounter: Payer: Self-pay | Admitting: Internal Medicine

## 2020-10-05 ENCOUNTER — Other Ambulatory Visit: Payer: Self-pay

## 2020-10-05 ENCOUNTER — Ambulatory Visit: Payer: No Typology Code available for payment source | Admitting: Internal Medicine

## 2020-10-05 DIAGNOSIS — R945 Abnormal results of liver function studies: Secondary | ICD-10-CM

## 2020-10-05 DIAGNOSIS — Z86010 Personal history of colonic polyps: Secondary | ICD-10-CM | POA: Diagnosis not present

## 2020-10-05 DIAGNOSIS — Z862 Personal history of diseases of the blood and blood-forming organs and certain disorders involving the immune mechanism: Secondary | ICD-10-CM

## 2020-10-05 DIAGNOSIS — K219 Gastro-esophageal reflux disease without esophagitis: Secondary | ICD-10-CM

## 2020-10-05 DIAGNOSIS — R7989 Other specified abnormal findings of blood chemistry: Secondary | ICD-10-CM

## 2020-10-05 NOTE — Assessment & Plan Note (Signed)
Colonoscopy 10/2019.  Recommended f/u colonoscopy in 5 years.  

## 2020-10-05 NOTE — Assessment & Plan Note (Signed)
Recent hgb 3/3 22 - wnl.

## 2020-10-05 NOTE — Assessment & Plan Note (Signed)
No significant upper symptoms.  On protonix.

## 2020-10-05 NOTE — Progress Notes (Signed)
Patient ID: BEAU VANDUZER, female   DOB: 02-12-1966, 55 y.o.   MRN: [id number]   Subjective:    Patient ID: Curlene Dolphin, female    DOB: Jan 22, 1966, 55 y.o.   MRN: [id number]  HPI This visit occurred during the SARS-CoV-2 public health emergency.  Safety protocols were in place, including screening questions prior to the visit, additional usage of staff PPE, and extensive cleaning of exam room while observing appropriate contact time as indicated for disinfecting solutions.  Patient here for a scheduled follow up.  Here to follow up regarding her fatty liver and reflux. She has adjusted her diet.  Lost weight.  Feels better.  Not exercising regularly.  Increased stress - taking care of her mother. Discussed.  Overall she feels she is handling stress relatively well.  Her brother is helping her.  No chest pain or sob reported.  No abdominal pain.  Bowels more regular.  Seeing GI.  Just had labs.  Liver function tests improved and near normal.  Scheduled for f/u with GI and f/u ultrasound planned - 02/2021 (per note).    Past Medical History:  Diagnosis Date  . Anemia   . Endometriosis   . Erosive gastritis   . GERD (gastroesophageal reflux disease)   . History of kidney stones   . Hyperplastic colon polyp   . Irritable bowel syndrome   . Kidney stones   . Migraines   . Nonalcoholic fatty liver disease   . Ovarian cyst   . Positional vertigo    PT helped   Past Surgical History:  Procedure Laterality Date  . ABDOMINAL HYSTERECTOMY  2000   left oophorectomy, Dr Burke Keels  . APPENDECTOMY  2000  . CESAREAN SECTION    . COLONOSCOPY    . ESOPHAGOGASTRODUODENOSCOPY    . EUS N/A 12/04/2017   Procedure: ESOPHAGEAL ENDOSCOPIC ULTRASOUND (EUS) RADIAL;  Surgeon: Holly Bodily, MD;  Location: [facility] ENDOSCOPY;  Service: Gastroenterology;  Laterality: N/A;  . EXPLORATORY LAPAROTOMY    . IMAGE GUIDED SINUS SURGERY Bilateral 08/29/2016   Procedure: IMAGE GUIDED SINUS SURGERY;  Surgeon: Margaretha Sheffield, MD;  Location: [place];  Service: ENT;  Laterality: Bilateral;  NEED DISK GAVE DISK TO CECE 1-23 KP  . LITHOTRIPSY    . RIGHT OOPHORECTOMY  2002  . SEPTOPLASTY Bilateral 08/29/2016   Procedure: SEPTOPLASTY;  Surgeon: Margaretha Sheffield, MD;  Location: [place];  Service: ENT;  Laterality: Bilateral;  . SPHENOIDECTOMY Right 08/29/2016   Procedure: SPHENOIDECTOMY  with removal content right;  Surgeon: Margaretha Sheffield, MD;  Location: [place];  Service: ENT;  Laterality: Right;  Marland Kitchen VAGINAL BIRTH AFTER CESAREAN SECTION     Family History  Problem Relation Age of Onset  . Hypertension Mother   . Diabetes Mother   . Thyroid cancer Mother   . Colon polyps Mother   . Thyroid cancer Maternal Aunt   . Colon polyps Maternal Aunt   . Kidney cancer Maternal Uncle   . Breast cancer Neg Hx    Social History   Socioeconomic History  . Marital status: Married    Spouse name: Not on file  . Number of children: 2  . Years of education: Not on file  . Highest education level: Not on file  Occupational History  . Not on file  Tobacco Use  . Smoking status: Never Smoker  . Smokeless tobacco: Never Used  Vaping Use  . Vaping Use: Never used  Substance and Sexual Activity  .  Alcohol use: No    Alcohol/week: 0.0 standard drinks  . Drug use: No  . Sexual activity: Not on file    Comment: Married   Other Topics Concern  . Not on file  Social History Narrative  . Not on file   Social Determinants of Health   Financial Resource Strain: Not on file  Food Insecurity: Not on file  Transportation Needs: Not on file  Physical Activity: Not on file  Stress: Not on file  Social Connections: Not on file    Outpatient Encounter Medications as of 10/05/2020  Medication Sig  . Multiple Vitamins-Minerals (WOMENS MULTIVITAMIN PO) Take by mouth.  . pantoprazole (PROTONIX) 40 MG tablet Take one tablet 30 minutes before breakfast  . Probiotic Product ([place]) Take by mouth.  . [DISCONTINUED] Methylcellulose, Laxative, 500 MG TABS Take by mouth.   No facility-administered encounter medications on file as of 10/05/2020.    Review of Systems  Constitutional: Negative for appetite change and unexpected weight change.  HENT: Negative for congestion and sinus pressure.   Respiratory: Negative for cough, chest tightness and shortness of breath.   Cardiovascular: Negative for chest pain, palpitations and leg swelling.  Gastrointestinal: Negative for abdominal pain, diarrhea, nausea and vomiting.  Genitourinary: Negative for difficulty urinating and dysuria.  Musculoskeletal: Negative for joint swelling and myalgias.  Skin: Negative for color change and rash.  Neurological: Negative for dizziness, light-headedness and headaches.  Psychiatric/Behavioral: Negative for agitation and dysphoric mood.       Objective:    Physical Exam Vitals reviewed.  Constitutional:      General: She is not in acute distress.    Appearance: Normal appearance.  HENT:     Head: Normocephalic and atraumatic.     Right Ear: External ear normal.     Left Ear: External ear normal.  Eyes:     General: No scleral icterus.       Right eye: No discharge.        Left eye: No discharge.     Conjunctiva/sclera: Conjunctivae normal.  Neck:     Thyroid: No thyromegaly.  Cardiovascular:     Rate and Rhythm: Normal rate and regular rhythm.  Pulmonary:     Effort: No respiratory distress.     Breath sounds: Normal breath sounds. No wheezing.  Abdominal:     General: Bowel sounds are normal.     Palpations: Abdomen is soft.     Tenderness: There is no abdominal tenderness.  Musculoskeletal:        General: No swelling or tenderness.     Cervical back: Neck supple. No tenderness.  Lymphadenopathy:     Cervical: No cervical adenopathy.  Skin:    Findings: No erythema.  Neurological:     Mental Status: She is alert.  Psychiatric:        Mood and Affect: Mood  normal.        Behavior: Behavior normal.     BP 122/70   Pulse 91   Temp 97.7 F (36.5 C) (Oral)   Resp 16   Ht 5' 6" (1.676 m)   Wt 210 lb (95.3 kg)   LMP 08/27/1998   SpO2 98%   BMI 33.89 kg/m  Wt Readings from Last 3 Encounters:  10/05/20 210 lb (95.3 kg)  06/19/20 219 lb (99.3 kg)  04/24/20 238 lb (108 kg)     Lab Results  Component Value Date   WBC 8.0 04/07/2020   HGB 12.8  04/07/2020   HCT 37.8 04/07/2020   PLT 208 04/07/2020   GLUCOSE 78 04/07/2020   CHOL 198 04/07/2020   TRIG 105 04/07/2020   HDL 48 04/07/2020   LDLCALC 131 (H) 04/07/2020   ALT 55 (H) 04/07/2020   AST 50 (H) 04/07/2020   NA 142 04/07/2020   K 4.2 04/07/2020   CL 103 04/07/2020   CREATININE 0.74 04/07/2020   BUN 13 04/07/2020   CO2 24 04/07/2020   TSH 1.620 04/07/2020   HGBA1C 4.9 09/29/2018    MM 3D SCREEN BREAST BILATERAL  Result Date: 05/31/2020 CLINICAL DATA:  Screening. EXAM: DIGITAL SCREENING BILATERAL MAMMOGRAM WITH TOMO AND CAD COMPARISON:  Previous exam(s). ACR Breast Density Category b: There are scattered areas of fibroglandular density. FINDINGS: There are no findings suspicious for malignancy. Images were processed with CAD. IMPRESSION: No mammographic evidence of malignancy. A result letter of this screening mammogram will be mailed directly to the patient. RECOMMENDATION: Screening mammogram in one year. (Code:SM-B-01Y) BI-RADS CATEGORY  1: Negative. Electronically Signed   By: Valentino Saxon MD   On: 05/31/2020 16:07       Assessment & Plan:   Problem List Items Addressed This Visit    Abnormal liver function tests    Diagnosed with NAFLD.  Followed by GI.  Has adjusted diet and lost weight.  Recent liver panel revealed slightly elevated alk phos.  Remainder of liver panel wnl.  Continue diet and exercise.  We will follow.  Has f/u planned with GI. Per note, f/u abdominal ultrasound in 02/2021.        GERD (gastroesophageal reflux disease)    No significant upper  symptoms.  On protonix.       History of anemia    Recent hgb 3/3 22 - wnl.       History of colonic polyps    Colonoscopy 10/2019.  Recommended f/u colonoscopy in 5 years.           Einar Pheasant, MD

## 2020-10-05 NOTE — Assessment & Plan Note (Signed)
Diagnosed with NAFLD.  Followed by GI.  Has adjusted diet and lost weight.  Recent liver panel revealed slightly elevated alk phos.  Remainder of liver panel wnl.  Continue diet and exercise.  We will follow.  Has f/u planned with GI. Per note, f/u abdominal ultrasound in 02/2021.

## 2020-12-11 ENCOUNTER — Other Ambulatory Visit: Payer: Self-pay | Admitting: Gastroenterology

## 2020-12-11 DIAGNOSIS — K760 Fatty (change of) liver, not elsewhere classified: Secondary | ICD-10-CM

## 2020-12-15 ENCOUNTER — Other Ambulatory Visit: Payer: Self-pay

## 2021-02-13 ENCOUNTER — Ambulatory Visit
Admission: RE | Admit: 2021-02-13 | Discharge: 2021-02-13 | Disposition: A | Discharge status: Home / Self Care | Payer: No Typology Code available for payment source | Source: Ambulatory Visit | Attending: Gastroenterology | Admitting: Gastroenterology

## 2021-02-13 ENCOUNTER — Other Ambulatory Visit: Payer: Self-pay

## 2021-02-13 DIAGNOSIS — K760 Fatty (change of) liver, not elsewhere classified: Secondary | ICD-10-CM | POA: Insufficient documentation

## 2021-04-13 ENCOUNTER — Other Ambulatory Visit: Payer: Self-pay

## 2021-04-13 ENCOUNTER — Ambulatory Visit (INDEPENDENT_AMBULATORY_CARE_PROVIDER_SITE_OTHER): Payer: No Typology Code available for payment source | Admitting: Internal Medicine

## 2021-04-13 ENCOUNTER — Encounter: Payer: Self-pay | Admitting: Internal Medicine

## 2021-04-13 ENCOUNTER — Other Ambulatory Visit (HOSPITAL_COMMUNITY)
Admission: RE | Admit: 2021-04-13 | Discharge: 2021-04-13 | Disposition: A | Discharge status: Home / Self Care | Payer: PRIVATE HEALTH INSURANCE | Source: Ambulatory Visit | Attending: Internal Medicine | Admitting: Internal Medicine

## 2021-04-13 VITALS — BP 126/70 | HR 90 | Temp 97.8°F | Resp 16 | Ht 66.0 in | Wt 220.8 lb

## 2021-04-13 DIAGNOSIS — Z124 Encounter for screening for malignant neoplasm of cervix: Secondary | ICD-10-CM | POA: Insufficient documentation

## 2021-04-13 DIAGNOSIS — F439 Reaction to severe stress, unspecified: Secondary | ICD-10-CM

## 2021-04-13 DIAGNOSIS — N951 Menopausal and female climacteric states: Secondary | ICD-10-CM

## 2021-04-13 DIAGNOSIS — R7989 Other specified abnormal findings of blood chemistry: Secondary | ICD-10-CM

## 2021-04-13 DIAGNOSIS — Z23 Encounter for immunization: Secondary | ICD-10-CM | POA: Diagnosis not present

## 2021-04-13 DIAGNOSIS — Z0000 Encounter for general adult medical examination without abnormal findings: Secondary | ICD-10-CM

## 2021-04-13 DIAGNOSIS — Z1231 Encounter for screening mammogram for malignant neoplasm of breast: Secondary | ICD-10-CM

## 2021-04-13 DIAGNOSIS — R945 Abnormal results of liver function studies: Secondary | ICD-10-CM | POA: Diagnosis not present

## 2021-04-13 DIAGNOSIS — N809 Endometriosis, unspecified: Secondary | ICD-10-CM

## 2021-04-13 DIAGNOSIS — Z13220 Encounter for screening for lipoid disorders: Secondary | ICD-10-CM | POA: Diagnosis not present

## 2021-04-13 DIAGNOSIS — Z862 Personal history of diseases of the blood and blood-forming organs and certain disorders involving the immune mechanism: Secondary | ICD-10-CM | POA: Diagnosis not present

## 2021-04-13 DIAGNOSIS — K219 Gastro-esophageal reflux disease without esophagitis: Secondary | ICD-10-CM

## 2021-04-13 DIAGNOSIS — Z86010 Personal history of colonic polyps: Secondary | ICD-10-CM

## 2021-04-13 MED ORDER — PANTOPRAZOLE SODIUM 40 MG PO TBEC: DELAYED_RELEASE_TABLET | ORAL | 2 refills | Status: DC

## 2021-04-13 NOTE — Assessment & Plan Note (Signed)
Physical 04/13/21.  PAP 04/13/21.  Mammogram 05/31/20 - Birads I.  Colonoscopy 10/2019 - f/u 5 years.

## 2021-04-13 NOTE — Progress Notes (Signed)
Patient ID: Katie [name], female   DOB: [birth year], 55 y.o.   MRN: [id number]   Subjective:    Patient ID: Katie [name], female    DOB: 1966-03-25, 55 y.o.   MRN: [id number]  This visit occurred during the SARS-CoV-2 public health emergency.  Safety protocols were in place, including screening questions prior to the visit, additional usage of staff PPE, and extensive cleaning of exam room while observing appropriate contact time as indicated for disinfecting solutions.   Patient here for her physical exam.   Chief Complaint  Patient presents with   Annual Exam   .   HPI Increased stress. Family stress.  Discussed.  Overall she feels she is handling things relatively well.  Does report hot flashes.  Discussed.  Discussed treatment options.  Wants to hold on medication at this time. No chest pain or sob reported.  No abdominal pain or bowel change reported.  Saw GI 03/20/21 - f/u NAFLD.  Discussed diet and exercise.     Past Medical History:  Diagnosis Date   Anemia    Endometriosis    Erosive gastritis    GERD (gastroesophageal reflux disease)    History of kidney stones    Hyperplastic colon polyp    Irritable bowel syndrome    Kidney stones    Migraines    Nonalcoholic fatty liver disease    Ovarian cyst    Positional vertigo    PT helped   Past Surgical History:  Procedure Laterality Date   ABDOMINAL HYSTERECTOMY  2000   left oophorectomy, Dr Mia Creek   APPENDECTOMY  2000   CESAREAN SECTION     COLONOSCOPY     ESOPHAGOGASTRODUODENOSCOPY     EUS N/A 12/04/2017   Procedure: ESOPHAGEAL ENDOSCOPIC ULTRASOUND (EUS) RADIAL;  Surgeon: Bearl Mulberry, MD;  Location: [facility] ENDOSCOPY;  Service: Gastroenterology;  Laterality: N/A;   EXPLORATORY LAPAROTOMY     IMAGE GUIDED SINUS SURGERY Bilateral 08/29/2016   Procedure: IMAGE GUIDED SINUS SURGERY;  Surgeon: Vernie Murders, MD;  Location: [facility];  Service: ENT;  Laterality: Bilateral;  NEED DISK GAVE DISK TO CECE  1-23 KP   LITHOTRIPSY     RIGHT OOPHORECTOMY  2002   SEPTOPLASTY Bilateral 08/29/2016   Procedure: SEPTOPLASTY;  Surgeon: Vernie Murders, MD;  Location: [facility];  Service: ENT;  Laterality: Bilateral;   SPHENOIDECTOMY Right 08/29/2016   Procedure: SPHENOIDECTOMY  with removal content right;  Surgeon: Vernie Murders, MD;  Location: [facility];  Service: ENT;  Laterality: Right;   VAGINAL BIRTH AFTER CESAREAN SECTION     Family History  Problem Relation Age of Onset   Hypertension Mother    Diabetes Mother    Thyroid cancer Mother    Colon polyps Mother    Thyroid cancer Maternal Aunt    Colon polyps Maternal Aunt    Kidney cancer Maternal Uncle    Breast cancer Neg Hx    Social History   Socioeconomic History   Marital status: Married    Spouse name: Not on file   Number of children: 2   Years of education: Not on file   Highest education level: Not on file  Occupational History   Not on file  Tobacco Use   Smoking status: Never   Smokeless tobacco: Never  Vaping Use   Vaping Use: Never used  Substance and Sexual Activity   Alcohol use: No    Alcohol/week: 0.0 standard drinks   Drug use: No  Sexual activity: Not on file    Comment: Married   Other Topics Concern   Not on file  Social History Narrative   Not on file   Social Determinants of Health   Financial Resource Strain: Not on file  Food Insecurity: Not on file  Transportation Needs: Not on file  Physical Activity: Not on file  Stress: Not on file  Social Connections: Not on file     Review of Systems  Constitutional:  Negative for appetite change and unexpected weight change.  HENT:  Negative for congestion, sinus pressure and sore throat.   Eyes:  Negative for pain and visual disturbance.  Respiratory:  Negative for cough, chest tightness and shortness of breath.   Cardiovascular:  Negative for chest pain, palpitations and leg swelling.  Gastrointestinal:  Negative for abdominal pain,  diarrhea, nausea and vomiting.  Genitourinary:  Negative for difficulty urinating and dysuria.  Musculoskeletal:  Negative for joint swelling and myalgias.  Skin:  Negative for color change and rash.  Neurological:  Negative for dizziness, light-headedness and headaches.  Hematological:  Negative for adenopathy. Does not bruise/bleed easily.  Psychiatric/Behavioral:  Negative for agitation and dysphoric mood.       Objective:     BP 126/70   Pulse 90   Temp 97.8 F (36.6 C)   Resp 16   Ht 5\' 6"  (1.676 m)   Wt 220 lb 12.8 oz (100.2 kg)   LMP 08/27/1998   SpO2 98%   BMI 35.64 kg/m  Wt Readings from Last 3 Encounters:  04/13/21 220 lb 12.8 oz (100.2 kg)  10/05/20 210 lb (95.3 kg)  06/19/20 219 lb (99.3 kg)    Physical Exam Vitals reviewed.  Constitutional:      General: She is not in acute distress.    Appearance: Normal appearance. She is well-developed.  HENT:     Head: Normocephalic and atraumatic.     Right Ear: External ear normal.     Left Ear: External ear normal.  Eyes:     General: No scleral icterus.       Right [name]: No discharge.        Left [name]: No discharge.     Conjunctiva/sclera: Conjunctivae normal.  Neck:     Thyroid: No thyromegaly.  Cardiovascular:     Rate and Rhythm: Normal rate and regular rhythm.  Pulmonary:     Effort: No tachypnea, accessory muscle usage or respiratory distress.     Breath sounds: Normal breath sounds. No decreased breath sounds, wheezing or rhonchi.  Chest:  Breasts:    Right: No inverted nipple, mass, nipple discharge or tenderness (no axillary adenopathy).     Left: No inverted nipple, mass, nipple discharge or tenderness (no axilarry adenopathy).  Abdominal:     General: Bowel sounds are normal.     Palpations: Abdomen is soft.     Tenderness: There is no abdominal tenderness.  Genitourinary:    Comments: Normal external genitalia.  Vaginal vault without lesions.  Could not appreciate any adnexal masses or  tenderness.   Musculoskeletal:        General: No swelling or tenderness.     Cervical back: Neck supple.  Lymphadenopathy:     Cervical: No cervical adenopathy.  Skin:    General: Skin is warm.     Findings: No erythema or rash.  Neurological:     Mental Status: She is alert and oriented to person, place, and time.  Psychiatric:  Mood and Affect: Mood normal.        Behavior: Behavior normal.     Outpatient Encounter Medications as of 04/13/2021  Medication Sig   Multiple Vitamins-Minerals (WOMENS MULTIVITAMIN PO) Take by mouth.   Probiotic Product ([facility] PO) Take by mouth.   [DISCONTINUED] pantoprazole (PROTONIX) 40 MG tablet Take one tablet 30 minutes before breakfast   pantoprazole (PROTONIX) 40 MG tablet Take one tablet 30 minutes before breakfast   No facility-administered encounter medications on file as of 04/13/2021.     Lab Results  Component Value Date   WBC 6.6 09/21/2020   HGB 12.9 09/21/2020   HCT 39 09/21/2020   PLT 194 09/21/2020   GLUCOSE 89 04/13/2021   CHOL 205 (H) 04/13/2021   TRIG 109 04/13/2021   HDL 57 04/13/2021   LDLCALC 129 (H) 04/13/2021   ALT 19 04/13/2021   AST 20 04/13/2021   NA 143 04/13/2021   K 4.3 04/13/2021   CL 104 04/13/2021   CREATININE 0.66 04/13/2021   BUN 14 04/13/2021   CO2 23 04/13/2021   TSH 1.28 09/21/2020   HGBA1C 4.9 09/29/2018    US Abdomen Complete  Result Date: 02/13/2021 CLINICAL DATA:  Nonalcoholic fatty liver disease EXAM: ABDOMEN ULTRASOUND COMPLETE COMPARISON:  02/22/2020 FINDINGS: Gallbladder: No gallstones or wall thickening visualized. No sonographic Murphy sign noted by sonographer. Common bile duct: Diameter: 4 mm Liver: No focal lesion. Diffusely increased parenchymal echogenicity. Portal vein is patent on color Doppler imaging with normal direction of blood flow towards the liver. IVC: No abnormality visualized. Pancreas: Visualized portion unremarkable. Spleen: Size and appearance  within normal limits. Right Kidney: Length: 10.6 cm. Echogenicity within normal limits. No mass or hydronephrosis visualized. Left Kidney: Length: 10.9 cm. Echogenicity within normal limits. No mass or hydronephrosis visualized. Abdominal aorta: No aneurysm visualized. Other findings: None. IMPRESSION: Diffuse increased echogenicity of the hepatic parenchyma is a nonspecific indicator of hepatocellular dysfunction, most commonly steatosis. Electronically Signed   By: Acquanetta Belling M.D.   On: 02/13/2021 14:35       Assessment & Plan:   Problem List Items Addressed This Visit     Abnormal liver function tests    Diagnosed with NAFLD.  Followed by GI. Continue diet and exercise.  We will follow.  Continue f/u with GI.       Relevant Orders   Hepatic function panel (Completed)   Endometriosis    S/p hysterectomy.  Previous pap with ASCUS.  Saw Dr Elesa Massed.  S/p colposcopy.  Recommended pap in 3 years.  PAP today.       GERD (gastroesophageal reflux disease)    No symptoms.  Protonix.       Relevant Medications   pantoprazole (PROTONIX) 40 MG tablet   Health care maintenance    Physical 04/13/21.  PAP 04/13/21.  Mammogram 05/31/20 - Birads I.  Colonoscopy 10/2019 - f/u 5 years.        History of anemia   Relevant Orders   Basic metabolic panel (Completed)   History of colonic polyps    Colonoscopy 10/2019.  Recommended f/u colonoscopy in 5 years.       Menopausal symptoms    Hot flashes as outlined.  Discussed treatment options.  Will notify me if desires anything more.       Stress    Increased stress as outlined.  Will notify me if feels needs anything more.       Other Visit Diagnoses  Routine general medical examination at a health care facility    -  Primary   Screening cholesterol level       Relevant Orders   Lipid panel (Completed)   Visit for screening mammogram       Relevant Orders   MM 3D SCREEN BREAST BILATERAL   Need for immunization against influenza        Relevant Orders   Flu Vaccine QUAD 81mo+IM (Fluarix, Fluzone & Alfiuria Quad PF) (Completed)   Cervical cancer screening       Relevant Orders   Cytology - PAP( [place])        Dale [place], MD

## 2021-04-14 LAB — HEPATIC FUNCTION PANEL
ALT: 19 IU/L (ref 0–32)
AST: 20 IU/L (ref 0–40)
Albumin: 4.6 g/dL (ref 3.8–4.9)
Alkaline Phosphatase: 137 IU/L — ABNORMAL HIGH (ref 44–121)
Bilirubin Total: 0.5 mg/dL (ref 0.0–1.2)
Bilirubin, Direct: 0.13 mg/dL (ref 0.00–0.40)
Total Protein: 7.2 g/dL (ref 6.0–8.5)

## 2021-04-14 LAB — BASIC METABOLIC PANEL
BUN/Creatinine Ratio: 21 (ref 9–23)
BUN: 14 mg/dL (ref 6–24)
CO2: 23 mmol/L (ref 20–29)
Calcium: 9 mg/dL (ref 8.7–10.2)
Chloride: 104 mmol/L (ref 96–106)
Creatinine, Ser: 0.66 mg/dL (ref 0.57–1.00)
Glucose: 89 mg/dL (ref 65–99)
Potassium: 4.3 mmol/L (ref 3.5–5.2)
Sodium: 143 mmol/L (ref 134–144)
eGFR: 105 mL/min/{1.73_m2} (ref >59)

## 2021-04-14 LAB — LIPID PANEL
Chol/HDL Ratio: 3.6 ratio (ref 0.0–4.4)
Cholesterol, Total: 205 mg/dL — ABNORMAL HIGH (ref 100–199)
HDL: 57 mg/dL (ref >39)
LDL Chol Calc (NIH): 129 mg/dL — ABNORMAL HIGH (ref 0–99)
Triglycerides: 109 mg/dL (ref 0–149)
VLDL Cholesterol Cal: 19 mg/dL (ref 5–40)

## 2021-04-16 ENCOUNTER — Other Ambulatory Visit: Payer: Self-pay | Admitting: Internal Medicine

## 2021-04-16 ENCOUNTER — Encounter: Payer: Self-pay | Admitting: Internal Medicine

## 2021-04-16 DIAGNOSIS — R748 Abnormal levels of other serum enzymes: Secondary | ICD-10-CM

## 2021-04-16 DIAGNOSIS — F439 Reaction to severe stress, unspecified: Secondary | ICD-10-CM | POA: Insufficient documentation

## 2021-04-16 LAB — CYTOLOGY - PAP
Adequacy: ABSENT
Comment: NEGATIVE
Diagnosis: NEGATIVE
High risk HPV: NEGATIVE

## 2021-04-16 NOTE — Progress Notes (Signed)
Orders placed for labs

## 2021-04-16 NOTE — Assessment & Plan Note (Signed)
Diagnosed with NAFLD.  Followed by GI. Continue diet and exercise.  We will follow.  Continue f/u with GI.

## 2021-04-16 NOTE — Assessment & Plan Note (Signed)
Colonoscopy 10/2019.  Recommended f/u colonoscopy in 5 years.  

## 2021-04-16 NOTE — Assessment & Plan Note (Signed)
S/p hysterectomy.  Previous pap with ASCUS.  Saw Dr Elesa Massed.  S/p colposcopy.  Recommended pap in 3 years.  PAP today.

## 2021-04-16 NOTE — Assessment & Plan Note (Signed)
No symptoms.  Protonix.  °

## 2021-04-16 NOTE — Assessment & Plan Note (Signed)
Hot flashes as outlined.  Discussed treatment options.  Will notify me if desires anything more.

## 2021-04-16 NOTE — Assessment & Plan Note (Signed)
Increased stress as outlined.  Will notify me if feels needs anything more.

## 2021-05-21 ENCOUNTER — Other Ambulatory Visit (INDEPENDENT_AMBULATORY_CARE_PROVIDER_SITE_OTHER): Payer: No Typology Code available for payment source

## 2021-05-21 ENCOUNTER — Other Ambulatory Visit: Payer: Self-pay

## 2021-05-21 DIAGNOSIS — R748 Abnormal levels of other serum enzymes: Secondary | ICD-10-CM | POA: Diagnosis not present

## 2021-05-22 LAB — VITAMIN D 25 HYDROXY (VIT D DEFICIENCY, FRACTURES): Vit D, 25-Hydroxy: 27.9 ng/mL — ABNORMAL LOW (ref 30.0–100.0)

## 2021-05-22 LAB — GAMMA GT: GGT: 20 IU/L (ref 0–60)

## 2021-05-24 ENCOUNTER — Telehealth: Payer: No Typology Code available for payment source | Admitting: Family Medicine

## 2021-05-24 DIAGNOSIS — H938X1 Other specified disorders of right ear: Secondary | ICD-10-CM | POA: Diagnosis not present

## 2021-05-24 MED ORDER — TRIAMCINOLONE ACETONIDE 55 MCG/ACT NA AERO: 2.0000 | INHALATION_SPRAY | Freq: Every day | NASAL | 12 refills | Status: DC

## 2021-05-24 NOTE — Progress Notes (Signed)
Virtual Visit Consent   TIFFNEY HAUGHTON, you are scheduled for a virtual visit with a [facility] provider today.     Just as with appointments in the office, your consent must be obtained to participate.  Your consent will be active for this visit and any virtual visit you may have with one of our providers in the next 365 days.     If you have a MyChart account, a copy of this consent can be sent to you electronically.  All virtual visits are billed to your insurance company just like a traditional visit in the office.    As this is a virtual visit, video technology does not allow for your provider to perform a traditional examination.  This may limit your provider's ability to fully assess your condition.  If your provider identifies any concerns that need to be evaluated in person or the need to arrange testing (such as labs, EKG, etc.), we will make arrangements to do so.     Although advances in technology are sophisticated, we cannot ensure that it will always work on either your end or our end.  If the connection with a video visit is poor, the visit may have to be switched to a telephone visit.  With either a video or telephone visit, we are not always able to ensure that we have a secure connection.     I need to obtain your verbal consent now.   Are you willing to proceed with your visit today?    CAMREIGH MICHIE has provided verbal consent on 05/24/2021 for a virtual visit (video or telephone).   Freddy Finner, NP   Date: 05/24/2021 10:34 AM   Virtual Visit via Video Note   I, Freddy Finner, connected with  KENISHIA PLACK  ([id number], 12/20/1965) on 05/24/21 at 10:45 AM EDT by a video-enabled telemedicine application and verified that I am speaking with the correct person using two identifiers.  Location: Patient: Virtual Visit Location Patient: Home Provider: Virtual Visit Location Provider: Home Office   I discussed the limitations of evaluation and management by  telemedicine and the availability of in person appointments. The patient expressed understanding and agreed to proceed.    History of Present Illness: Katie [name] is a 55 y.o. who identifies as a female who was assigned female at birth, and is being seen today for Right ear pain over the last several day- only when she swallows. Woke up today with a bad headache this am, husband is reporting a bad headache as well. Denies fevers, chills, shortness of breath, chest pain, coughing, runny nose, or sore throat. No known sick contacts or other flu like symptoms.    Problems:  Patient Active Problem List   Diagnosis Date Noted   Stress 04/16/2021   Sinus congestion 07/02/2020   SOB (shortness of breath) on exertion 09/26/2018   Abdominal pain 03/27/2018   History of anemia 04/23/2017   BPPV (benign paroxysmal positional vertigo), left 04/23/2016   Chest pressure 07/02/2015   Health care maintenance 03/19/2015   GERD (gastroesophageal reflux disease) 02/12/2015   History of colonic polyps 02/12/2015   Urinary frequency 01/29/2015   Abdominal pain, epigastric 06/23/2014   Pain in the chest 06/23/2014   Sinusitis, acute 05/16/2014   Abnormal liver function tests 03/06/2014   Thyroid fullness 03/06/2014   Menopausal symptoms 03/04/2013   Edema of both legs 02/03/2013   Sinusitis 11/02/2012   Family history of colonic polyps 08/27/2012  Migraine headache 08/27/2012   Nephrolithiasis 08/27/2012   Endometriosis 08/27/2012    Allergies:  Allergies  Allergen Reactions   Doxycycline Diarrhea and Nausea And Vomiting        Levaquin [Levofloxacin]     Joint pain   Shellfish Allergy Nausea And Vomiting   Amoxicillin Rash   Codeine Other (See Comments)    Makes her feel disoriented   Contrast Media [Iodinated Diagnostic Agents] Rash   Penicillins Rash    Has patient had a PCN reaction causing immediate rash, facial/tongue/throat swelling, SOB or lightheadedness with hypotension:  Yes Has patient had a PCN reaction causing severe rash involving mucus membranes or skin necrosis: No Has patient had a PCN reaction that required hospitalization: No Has patient had a PCN reaction occurring within the last 10 years: Yes If all of the above answers are "NO", then may proceed with Cephalosporin use.  **No issues with CEFTIN**   Medications:  Current Outpatient Medications:    Multiple Vitamins-Minerals (WOMENS MULTIVITAMIN PO), Take by mouth., Disp: , Rfl:    pantoprazole (PROTONIX) 40 MG tablet, Take one tablet 30 minutes before breakfast, Disp: 90 tablet, Rfl: 2   Probiotic Product (PHILLIPS COLON HEALTH PO), Take by mouth., Disp: , Rfl:   Observations/Objective: Patient is well-developed, well-nourished in no acute distress.  Resting comfortably  at home.  Head is normocephalic, atraumatic.  No labored breathing.  Speech is clear and coherent with logical content.  Patient is alert and oriented at baseline.    Assessment and Plan: 1. Pressure sensation in right ear Sinus related most likely. Will start nasacort and have f/u if not improved  Reports she does not want to try anbx at this time- unless it worsens  - triamcinolone (NASACORT) 55 MCG/ACT AERO nasal inhaler; Place 2 sprays into the nose daily.  Dispense: 1 each; Refill: 12    Reviewed side effects, risks and benefits of medication.    Patient acknowledged agreement and understanding of the plan.  I discussed the assessment and treatment plan with the patient. The patient was provided an opportunity to ask questions and all were answered. The patient agreed with the plan and demonstrated an understanding of the instructions.   The patient was advised to call back or seek an in-person evaluation if the symptoms worsen or if the condition fails to improve as anticipated.   The above assessment and management plan was discussed with the patient. The patient verbalized understanding of and has agreed to  the management plan. Patient is aware to call the clinic if symptoms persist or worsen. Patient is aware when to return to the clinic for a follow-up visit. Patient educated on when it is appropriate to go to the emergency department.   Follow Up Instructions: I discussed the assessment and treatment plan with the patient. The patient was provided an opportunity to ask questions and all were answered. The patient agreed with the plan and demonstrated an understanding of the instructions.  A copy of instructions were sent to the patient via MyChart unless otherwise noted below.     The patient was advised to call back or seek an in-person evaluation if the symptoms worsen or if the condition fails to improve as anticipated.  Time:  I spent 10 minutes with the patient via telehealth technology discussing the above problems/concerns.    Freddy Finner, NP

## 2021-05-24 NOTE — Patient Instructions (Signed)
Please follow-up if not improved.

## 2021-05-31 ENCOUNTER — Other Ambulatory Visit: Payer: Self-pay

## 2021-05-31 ENCOUNTER — Ambulatory Visit
Admission: RE | Admit: 2021-05-31 | Discharge: 2021-05-31 | Disposition: A | Discharge status: Home / Self Care | Payer: PRIVATE HEALTH INSURANCE | Source: Ambulatory Visit | Attending: Internal Medicine | Admitting: Internal Medicine

## 2021-05-31 DIAGNOSIS — Z1231 Encounter for screening mammogram for malignant neoplasm of breast: Secondary | ICD-10-CM | POA: Diagnosis present

## 2021-06-01 LAB — ALKALINE PHOSPHATASE, ISOENZYMES

## 2021-06-29 ENCOUNTER — Telehealth (INDEPENDENT_AMBULATORY_CARE_PROVIDER_SITE_OTHER): Payer: No Typology Code available for payment source | Admitting: Internal Medicine

## 2021-06-29 ENCOUNTER — Telehealth: Payer: Self-pay | Admitting: Internal Medicine

## 2021-06-29 ENCOUNTER — Encounter: Payer: Self-pay | Admitting: Internal Medicine

## 2021-06-29 DIAGNOSIS — U071 COVID-19: Secondary | ICD-10-CM | POA: Diagnosis not present

## 2021-06-29 NOTE — Telephone Encounter (Signed)
Scheduled for virtual with [place] at 230.

## 2021-06-29 NOTE — Progress Notes (Signed)
Patient ID: Katie [name], female   DOB: May 15, 1966, 55 y.o.   MRN: [id number]   Virtual Visit via video Note  This visit type was conducted due to national recommendations for restrictions regarding the COVID-19 pandemic (e.g. social distancing).  This format is felt to be most appropriate for this patient at this time.  All issues noted in this document were discussed and addressed.  No physical exam was performed (except for noted visual exam findings with Video Visits).   I connected with [name] by a video enabled telemedicine application and verified that I am speaking with the correct person using two identifiers. Location patient: home Location provider: work  Persons participating in the virtual visit: patient, provider  The limitations, risks, security and privacy concerns of performing an evaluation and management service by video and the availability of in person appointments have been discussed.  It has also been discussed with the patient that there may be a patient responsible charge related to this service. The patient expressed understanding and agreed to proceed.   Reason for visit: work in appt  HPI: Work in - covid positive.  Husband developed sinus symptoms Monday 06/25/21.  He tested positive 06/27/21.  She noticed some increased drainage and sore throat - 06/27/21 pm.  Fever last night (99.5).  no chest pain or sob. No nausea or vomiting.  Some aching.  States feels like a bad sinus infection.  Has been taking sudafed and tylenol sinus.  Taking ibuprofen.    ROS: See pertinent positives and negatives per HPI.  Past Medical History:  Diagnosis Date   Anemia    Endometriosis    Erosive gastritis    GERD (gastroesophageal reflux disease)    History of kidney stones    Hyperplastic colon polyp    Irritable bowel syndrome    Kidney stones    Migraines    Nonalcoholic fatty liver disease    Ovarian cyst    Positional vertigo    PT helped    Past Surgical  History:  Procedure Laterality Date   ABDOMINAL HYSTERECTOMY  2000   left oophorectomy, Dr Mia Creek   APPENDECTOMY  2000   CESAREAN SECTION     COLONOSCOPY     ESOPHAGOGASTRODUODENOSCOPY     EUS N/A 12/04/2017   Procedure: ESOPHAGEAL ENDOSCOPIC ULTRASOUND (EUS) RADIAL;  Surgeon: Bearl Mulberry, MD;  Location[facility];  Service: Gastroenterology;  Laterality: N/A;   EXPLORATORY LAPAROTOMY     IMAGE GUIDED SINUS SURGERY Bilateral 08/29/2016   Procedure: IMAGE GUIDED SINUS SURGERY;  Surgeon: Vernie Murders, MD;  Location: [facility]TR;  Service: ENT;  Laterality: Bilateral;  NEED DISK GAVE DISK TO CECE 1-23 KP   LITHOTRIPSY     RIGHT OOPHORECTOMY  2002   SEPTOPLASTY Bilateral 08/29/2016   Procedure: SEPTOPLASTY;  Surgeon: Vernie Murders, MD;  Location: [facility] SUR[facility]ERY [facility]TR;  Service: ENT;  Laterality: Bilateral;   SPHENOIDECTOMY Right 08/29/2016   Procedure: SPHENOIDECTOMY  with removal content right;  Surgeon: Vernie Murders, MD;  Location: [facility] SUR[facility]ERY CNTR;  Service: ENT;  Laterality: Right;   VAGINAL BIRTH AFTER CESAREAN SECTION      Family History  Problem Relation Age of Onset   Hypertension Mother    Diabetes Mother    Thyroid cancer Mother    Colon polyps Mother    Thyroid cancer Maternal Aunt    Colon polyps Maternal Aunt    Kidney cancer Maternal Uncle    Breast cancer Cousin  maternal    SOCIAL HX: reviewed.    Current Outpatient Medications:    Multiple Vitamins-Minerals (WOMENS MULTIVITAMIN PO), Take by mouth., Disp: , Rfl:    pantoprazole (PROTONIX) 40 MG tablet, Take one tablet 30 minutes before breakfast, Disp: 90 tablet, Rfl: 2   Probiotic Product (PHILLIPS COLON HEALTH PO), Take by mouth., Disp: , Rfl:    triamcinolone (NASACORT) 55 MCG/ACT AERO nasal inhaler, Place 2 sprays into the nose daily., Disp: 1 each, Rfl: 12  EXAM:  GENERAL: alert, oriented, appears well and in no acute distress  HEENT: atraumatic, conjunttiva clear, no obvious  abnormalities on inspection of external nose and ears  NECK: normal movements of the head and neck  LUNGS: on inspection no signs of respiratory distress, breathing rate appears normal, no obvious gross SOB, gasping or wheezing  CV: no obvious cyanosis  PSYCH/NEURO: pleasant and cooperative, no obvious depression or anxiety, speech and thought processing grossly intact  ASSESSMENT AND PLAN:  Discussed the following assessment and plan:  Problem List Items Addressed This Visit     COVID-19 virus infection    Tested positive for covid today.  Symptoms started 06/27/21 pm.  Feels like a bad cold.  Can continue sudafed.  Saline nasal spray and steroid nasal spray as directed.  Feels mucinex dries her out.  No chest pain or sob.  Eating.  Hold on antivirals given age and lack of comorbidities.  Discussed quarantine guidelines.  Call with update.  Keep me posted.         Return if symptoms worsen or fail to improve.   I discussed the assessment and treatment plan with the patient. The patient was provided an opportunity to ask questions and all were answered. The patient agreed with the plan and demonstrated an understanding of the instructions.   The patient was advised to call back or seek an in-person evaluation if the symptoms worsen or if the condition fails to improve as anticipated.    Dale Roosevelt, MD

## 2021-06-29 NOTE — Telephone Encounter (Signed)
Patient called in husband tested positive for  Covid and she has tested positive Home tested , Running fever 99.5 sore throat , headache feel severe sinus infection coughing  requesting  medicine Xfer to Access nurse

## 2021-06-29 NOTE — Telephone Encounter (Signed)
Tested positive today for COVID, fever 99.5 feels real achy and sore, triage nurse sent patient home care information she is taking tylenol and sudafed patient was wanting to know if you thought she needed antiviral since she has no comorbidity such as diabetes , or COPD the Acces Nurse triage nurse advised she may not need. NO SOB, NO Chills has body aches, headache and mainly sinus symptoms.

## 2021-06-29 NOTE — Telephone Encounter (Signed)
My chart message sent to Ms Fowles for update.

## 2021-06-29 NOTE — Telephone Encounter (Signed)
See if she will agree to be evaluated - my chart virtual

## 2021-06-30 ENCOUNTER — Encounter: Payer: Self-pay | Admitting: Internal Medicine

## 2021-06-30 DIAGNOSIS — U071 COVID-19: Secondary | ICD-10-CM | POA: Insufficient documentation

## 2021-06-30 NOTE — Assessment & Plan Note (Signed)
Tested positive for covid today.  Symptoms started 06/27/21 pm.  Feels like a bad cold.  Can continue sudafed.  Saline nasal spray and steroid nasal spray as directed.  Feels mucinex dries her out.  No chest pain or sob.  Eating.  Hold on antivirals given age and lack of comorbidities.  Discussed quarantine guidelines.  Call with update.  Keep me posted.

## 2021-07-03 ENCOUNTER — Telehealth: Payer: Self-pay

## 2021-07-03 NOTE — Telephone Encounter (Signed)
She is currently taking sudafed and alternating motrin and tylenol.  It appears she is still having drainage.  If not using nasal spray, then can start saline nasal spray - flush nose at least 2x/day.  Also nasacort nasal spray - 2 sprays each nostril one time per day.  If already using these, let me know.  If having increased cough, coughing fits - let me know if feels needs something for cough.  If persistent increased symptoms, will need f/u appt.

## 2021-07-03 NOTE — Telephone Encounter (Signed)
Are you having Shortness of breath? NO Congestion rather it be nasal or chest?Nasal Coughing?Yes nonproductive. Caused from the nasal drainage per patient Chest pain?NO Back pain?NO Arm pain?NO Jaw pain?Yes, upper, near ear. Blurry vision?NO Dizziness?NO Fever/chills?NO Headache? [place] Sous[place] Urinary issues?NO Patient did have issues todfay while working during these sx. She was having an issue focusing and trying to concentrate on what she was doing. She has lost her sense of taste as of Saturday.   Also what are you currently taking for your symptoms?  Sudafed and alternating Motrin and Tylenol   This MyChart message has not been read. Dale [place], MD to Me       4:35 PM Note Confirm no acute issues - for example sob, etc.  If having persistent symptoms, may need to be reevaluated.  Can see about virtual work in appt.  I can try to work her in - will d/w Trisha in am.  If any acute symptoms, will need to be evaluated.

## 2021-07-03 NOTE — Telephone Encounter (Signed)
Confirm no acute issues - for example sob, etc.  If having persistent symptoms, may need to be reevaluated.  Can see about virtual work in appt.  I can try to work her in - will d/w Trisha in am.  If any acute symptoms, will need to be evaluated.

## 2021-07-04 NOTE — Telephone Encounter (Signed)
Patient is doing everything listed below. Patient stated that she is not really coughing that much. She has lost her taste and smell and she is still having brain fog. No worsening symptoms. Stated that she felt a little better today. Scheduled for VV to f/u with Dr Lorin Picket per patient request

## 2021-07-05 ENCOUNTER — Telehealth (INDEPENDENT_AMBULATORY_CARE_PROVIDER_SITE_OTHER): Payer: No Typology Code available for payment source | Admitting: Internal Medicine

## 2021-07-05 DIAGNOSIS — K219 Gastro-esophageal reflux disease without esophagitis: Secondary | ICD-10-CM

## 2021-07-05 DIAGNOSIS — U071 COVID-19: Secondary | ICD-10-CM

## 2021-07-05 MED ORDER — PREDNISONE 10 MG PO TABS: ORAL_TABLET | ORAL | 0 refills | Status: DC

## 2021-07-05 NOTE — Progress Notes (Deleted)
Patient ID: Katie [name], female   DOB: 29-Aug-1965, 55 y.o.   MRN: [id number]   Subjective:    Patient ID: Katie [name], female    DOB: 12-Sep-1965, 55 y.o.   MRN: [id number]  This visit occurred during the SARS-CoV-2 public health emergency.  Safety protocols were in place, including screening questions prior to the visit, additional usage of staff PPE, and extensive cleaning of exam room while observing appropriate contact time as indicated for disinfecting solutions.   Patient here for  work in appt.   HPI    Past Medical History:  Diagnosis Date   Anemia    Endometriosis    Erosive gastritis    GERD (gastroesophageal reflux disease)    History of kidney stones    Hyperplastic colon polyp    Irritable bowel syndrome    Kidney stones    Migraines    Nonalcoholic fatty liver disease    Ovarian cyst    Positional vertigo    PT helped   Past Surgical History:  Procedure Laterality Date   ABDOMINAL HYSTERECTOMY  2000   left oophorectomy, Dr Mia Creek   APPENDECTOMY  2000   CESAREAN SECTION     COLONOSCOPY     ESOPHAGOGASTRODUODENOSCOPY     EUS N/A 12/04/2017   Procedure: ESOPHAGEAL ENDOSCOPIC ULTRASOUND (EUS) RADIAL;  Surgeon: Bearl Mulberry, MD;  Location: [facility] ENDOSCOPY;  Service: Gastroenterology;  Laterality: N/A;   EXPLORATORY LAPAROTOMY     IMAGE GUIDED SINUS SURGERY Bilateral 08/29/2016   Procedure: IMAGE GUIDED SINUS SURGERY;  Surgeon: Vernie Murders, MD;  Location: [facility];  Service: ENT;  Laterality: Bilateral;  NEED DISK GAVE DISK TO CECE 1-23 KP   LITHOTRIPSY     RIGHT OOPHORECTOMY  2002   SEPTOPLASTY Bilateral 08/29/2016   Procedure: SEPTOPLASTY;  Surgeon: Vernie Murders, MD;  Location: [facility];  Service: ENT;  Laterality: Bilateral;   SPHENOIDECTOMY Right 08/29/2016   Procedure: SPHENOIDECTOMY  with removal content right;  Surgeon: Vernie Murders, MD;  Location: [facility];  Service: ENT;  Laterality: Right;   VAGINAL BIRTH  AFTER CESAREAN SECTION     Family History  Problem Relation Age of Onset   Hypertension Mother    Diabetes Mother    Thyroid cancer Mother    Colon polyps Mother    Thyroid cancer Maternal Aunt    Colon polyps Maternal Aunt    Kidney cancer Maternal Uncle    Breast cancer Cousin        maternal   Social History   Socioeconomic History   Marital status: Married    Spouse name: Not on file   Number of children: 2   Years of education: Not on file   Highest education level: Not on file  Occupational History   Not on file  Tobacco Use   Smoking status: Never   Smokeless tobacco: Never  Vaping Use   Vaping Use: Never used  Substance and Sexual Activity   Alcohol use: No    Alcohol/week: 0.0 standard drinks   Drug use: No   Sexual activity: Not on file    Comment: Married   Other Topics Concern   Not on file  Social History Narrative   Not on file   Social Determinants of Health   Financial Resource Strain: Not on file  Food Insecurity: Not on file  Transportation Needs: Not on file  Physical Activity: Not on file  Stress: Not on file  Social Connections: Not on  file     Review of Systems     Objective:     LMP 08/27/1998  Wt Readings from Last 3 Encounters:  06/29/21 220 lb 12 oz (100.1 kg)  04/13/21 220 lb 12.8 oz (100.2 kg)  10/05/20 210 lb (95.3 kg)    Physical Exam   Outpatient Encounter Medications as of 07/05/2021  Medication Sig   Multiple Vitamins-Minerals (WOMENS MULTIVITAMIN PO) Take by mouth.   pantoprazole (PROTONIX) 40 MG tablet Take one tablet 30 minutes before breakfast   Probiotic Product (PHILLIPS COLON HEALTH PO) Take by mouth.   triamcinolone (NASACORT) 55 MCG/ACT AERO nasal inhaler Place 2 sprays into the nose daily.   No facility-administered encounter medications on file as of 07/05/2021.     Lab Results  Component Value Date   WBC 6.6 09/21/2020   HGB 12.9 09/21/2020   HCT 39 09/21/2020   PLT 194 09/21/2020   GLUCOSE  89 04/13/2021   CHOL 205 (H) 04/13/2021   TRIG 109 04/13/2021   HDL 57 04/13/2021   LDLCALC 129 (H) 04/13/2021   ALT 19 04/13/2021   AST 20 04/13/2021   NA 143 04/13/2021   K 4.3 04/13/2021   CL 104 04/13/2021   CREATININE 0.66 04/13/2021   BUN 14 04/13/2021   CO2 23 04/13/2021   TSH 1.28 09/21/2020   HGBA1C 4.9 09/29/2018    MM 3D SCREEN BREAST BILATERAL  Result Date: 05/31/2021 CLINICAL DATA:  Screening. EXAM: DIGITAL SCREENING BILATERAL MAMMOGRAM WITH TOMOSYNTHESIS AND CAD TECHNIQUE: Bilateral screening digital craniocaudal and mediolateral oblique mammograms were obtained. Bilateral screening digital breast tomosynthesis was performed. The images were evaluated with computer-aided detection. COMPARISON:  Previous exam(s). ACR Breast Density Category b: There are scattered areas of fibroglandular density. FINDINGS: There are no findings suspicious for malignancy. IMPRESSION: No mammographic evidence of malignancy. A result letter of this screening mammogram will be mailed directly to the patient. RECOMMENDATION: Screening mammogram in one year. (Code:SM-B-01Y) BI-RADS CATEGORY  1: Negative. Electronically Signed   By: Emmaline Kluver M.D.   On: 05/31/2021 11:22      Assessment & Plan:   Problem List Items Addressed This Visit   None    Dale Loretto, MD

## 2021-07-08 ENCOUNTER — Encounter: Payer: Self-pay | Admitting: Internal Medicine

## 2021-07-08 NOTE — Assessment & Plan Note (Signed)
Tested positive 06/29/21.  Symptoms started 06/27/21.  Increased sinus pressure, drainage and cough.  Coughing fits.  Continue saline nasal spray and steroid nasal spray as directed.  Prednisone taper as directed.  Rest.  Fluids.  Follow.

## 2021-07-08 NOTE — Assessment & Plan Note (Signed)
No symptoms.  Protonix.

## 2021-07-08 NOTE — Progress Notes (Signed)
Patient ID: Katie [name], female   DOB: [birth year], 55 y.o.   MRN: [id number]   Virtual Visit via video Note  This visit type was conducted due to national recommendations for restrictions regarding the COVID-19 pandemic (e.g. social distancing).  This format is felt to be most appropriate for this patient at this time.  All issues noted in this document were discussed and addressed.  No physical exam was performed (except for noted visual exam findings with Video Visits).   I connected with Katie [name] by a video enabled telemedicine application and verified that I am speaking with the correct person using two identifiers. Location patient: home Location provider: work Persons participating in the virtual visit: patient, provider  The limitations, risks, security and privacy concerns of performing an evaluation and management service by video and the availability of in person appointments have been discussed.  It has also been discussed with the patient that there may be a patient responsible charge related to this service. The patient expressed understanding and agreed to proceed.  Reason for visit: work in appt  HPI: Was evaluated 06/29/21 - covid.  Symptoms started 06/27/21.  Increased sinus pressure.  Decreased taste and smell.  Increased drainage.  Increased cough.  Has been trying to work - intermittently from home.  Eating.  No nausea or vomiting.  Taking nasacort, sudaged and tylenol.  Some increased coughing - fits.  No diarrhea.     ROS: See pertinent positives and negatives per HPI.  Past Medical History:  Diagnosis Date   Anemia    Endometriosis    Erosive gastritis    GERD (gastroesophageal reflux disease)    History of kidney stones    Hyperplastic colon polyp    Irritable bowel syndrome    Kidney stones    Migraines    Nonalcoholic fatty liver disease    Ovarian cyst    Positional vertigo    PT helped    Past Surgical History:  Procedure Laterality Date    ABDOMINAL HYSTERECTOMY  2000   left oophorectomy, Dr Mia Creek   APPENDECTOMY  2000   CESAREAN SECTION     COLONOSCOPY     ESOPHAGOGASTRODUODENOSCOPY     EUS N/A 12/04/2017   Procedure: ESOPHAGEAL ENDOSCOPIC ULTRASOUND (EUS) RADIAL;  Surgeon: Bearl Mulberry, MD;  Location: [facility];  Service: Gastroenterology;  Laterality: N/A;   EXPLORATORY LAPAROTOMY     IMAGE GUIDED SINUS SURGERY Bilateral 08/29/2016   Procedure: IMAGE GUIDED SINUS SURGERY;  Surgeon: Vernie Murders, MD;  Location: [facility];  Service: ENT;  Laterality: Bilateral;  NEED DISK GAVE DISK TO CECE 1-23 KP   LITHOTRIPSY     RIGHT OOPHORECTOMY  2002   SEPTOPLASTY Bilateral 08/29/2016   Procedure: SEPTOPLASTY;  Surgeon: Vernie Murders, MD;  Location: [facility];  Service: ENT;  Laterality: Bilateral;   SPHENOIDECTOMY Right 08/29/2016   Procedure: SPHENOIDECTOMY  with removal content right;  Surgeon: Vernie Murders, MD;  Location: [facility];  Service: ENT;  Laterality: Right;   VAGINAL BIRTH AFTER CESAREAN SECTION      Family History  Problem Relation Age of Onset   Hypertension Mother    Diabetes Mother    Thyroid cancer Mother    Colon polyps Mother    Thyroid cancer Maternal Aunt    Colon polyps Maternal Aunt    Kidney cancer Maternal Uncle    Breast cancer Cousin        maternal    SOCIAL HX: reviewed.  Current Outpatient Medications:    Multiple Vitamins-Minerals (WOMENS MULTIVITAMIN PO), Take by mouth., Disp: , Rfl:    pantoprazole (PROTONIX) 40 MG tablet, Take one tablet 30 minutes before breakfast, Disp: 90 tablet, Rfl: 2   predniSONE (DELTASONE) 10 MG tablet, Take 4 tablets x 1 day and then decrease by 1/2 tablet per day until down to zero mg., Disp: 18 tablet, Rfl: 0   Probiotic Product (PHILLIPS COLON HEALTH PO), Take by mouth., Disp: , Rfl:    triamcinolone (NASACORT) 55 MCG/ACT AERO nasal inhaler, Place 2 sprays into the nose daily., Disp: 1 each, Rfl:  12  EXAM:  GENERAL: alert, oriented, appears well and in no acute distress  HEENT: atraumatic, conjunttiva clear, no obvious abnormalities on inspection of external nose and ears  NECK: normal movements of the head and neck  LUNGS: on inspection no signs of respiratory distress, breathing rate appears normal, no obvious gross SOB, gasping or wheezing  CV: no obvious cyanosis  PSYCH/NEURO: pleasant and cooperative, no obvious depression or anxiety, speech and thought processing grossly intact  ASSESSMENT AND PLAN:  Discussed the following assessment and plan:  Problem List Items Addressed This Visit     COVID-19 virus infection    Tested positive 06/29/21.  Symptoms started 06/27/21.  Increased sinus pressure, drainage and cough.  Coughing fits.  Continue saline nasal spray and steroid nasal spray as directed.  Prednisone taper as directed.  Rest.  Fluids.  Follow.        GERD (gastroesophageal reflux disease)    No symptoms.  Protonix.        Return if symptoms worsen or fail to improve, for keep scheduled.   I discussed the assessment and treatment plan with the patient. The patient was provided an opportunity to ask questions and all were answered. The patient agreed with the plan and demonstrated an understanding of the instructions.   The patient was advised to call back or seek an in-person evaluation if the symptoms worsen or if the condition fails to improve as anticipated.    Dale [place], MD

## 2021-09-07 DIAGNOSIS — M7552 Bursitis of left shoulder: Secondary | ICD-10-CM | POA: Diagnosis not present

## 2021-09-11 ENCOUNTER — Encounter: Payer: Self-pay | Admitting: Internal Medicine

## 2021-09-11 DIAGNOSIS — M25512 Pain in left shoulder: Secondary | ICD-10-CM | POA: Insufficient documentation

## 2021-10-12 ENCOUNTER — Other Ambulatory Visit: Payer: Self-pay

## 2021-10-12 ENCOUNTER — Ambulatory Visit: Payer: BC Managed Care – PPO | Admitting: Internal Medicine

## 2021-10-12 VITALS — BP 128/72 | HR 81 | Temp 97.9°F | Resp 16 | Ht 66.0 in | Wt 238.0 lb

## 2021-10-12 DIAGNOSIS — Z1159 Encounter for screening for other viral diseases: Secondary | ICD-10-CM | POA: Diagnosis not present

## 2021-10-12 DIAGNOSIS — Z86010 Personal history of colonic polyps: Secondary | ICD-10-CM

## 2021-10-12 DIAGNOSIS — R7989 Other specified abnormal findings of blood chemistry: Secondary | ICD-10-CM | POA: Diagnosis not present

## 2021-10-12 DIAGNOSIS — F439 Reaction to severe stress, unspecified: Secondary | ICD-10-CM

## 2021-10-12 DIAGNOSIS — K219 Gastro-esophageal reflux disease without esophagitis: Secondary | ICD-10-CM | POA: Diagnosis not present

## 2021-10-12 DIAGNOSIS — Z862 Personal history of diseases of the blood and blood-forming organs and certain disorders involving the immune mechanism: Secondary | ICD-10-CM

## 2021-10-12 DIAGNOSIS — Z114 Encounter for screening for human immunodeficiency virus [HIV]: Secondary | ICD-10-CM

## 2021-10-12 DIAGNOSIS — Z7184 Encounter for health counseling related to travel: Secondary | ICD-10-CM

## 2021-10-12 DIAGNOSIS — N809 Endometriosis, unspecified: Secondary | ICD-10-CM

## 2021-10-12 DIAGNOSIS — M25512 Pain in left shoulder: Secondary | ICD-10-CM

## 2021-10-12 DIAGNOSIS — Z13220 Encounter for screening for lipoid disorders: Secondary | ICD-10-CM

## 2021-10-12 MED ORDER — SCOPOLAMINE 1 MG/3DAYS TD PT72: 1.0000 | MEDICATED_PATCH | TRANSDERMAL | 0 refills | Status: DC

## 2021-10-12 MED ORDER — PANTOPRAZOLE SODIUM 40 MG PO TBEC
40.0000 mg | DELAYED_RELEASE_TABLET | Freq: Two times a day (BID) | ORAL | 1 refills | Status: DC
Start: 2021-10-12 — End: 2022-01-29

## 2021-10-12 NOTE — Progress Notes (Signed)
Patient ID: Katie [name], female   DOB: 01/09/1966, 56 y.o.   MRN: [id number] ? ? ?Subjective:  ? ? Patient ID: Katie [name], female    DOB: 05/08/1966, 56 y.o.   MRN: [id number] ? ?This visit occurred during the SARS-CoV-2 public health emergency.  Safety protocols were in place, including screening questions prior to the visit, additional usage of staff PPE, and extensive cleaning of exam room while observing appropriate contact time as indicated for disinfecting solutions.  ? ?Patient here for a scheduled follow up.  ? ?Chief Complaint  ?Patient presents with  ? Gastroesophageal Reflux  ? .  ? ?HPI ?On protonix.  Taking daily in am.  Still with break through symptoms.  Notices acid reflux.  No chest pain or sob reported. No abdominal pain.  Bowels moving.  Handling stress.  Does not feel needs any further intervention.  Up to date with colonoscopy.  Planning a cruise in July.  Discussed scopolamine patches.  Saw ortho Dr Odis Luster - left shoulder bursitis.   ? ? ?Past Medical History:  ?Diagnosis Date  ? Anemia   ? Endometriosis   ? Erosive gastritis   ? GERD (gastroesophageal reflux disease)   ? History of kidney stones   ? Hyperplastic colon polyp   ? Irritable bowel syndrome   ? Kidney stones   ? Migraines   ? Nonalcoholic fatty liver disease   ? Ovarian cyst   ? Positional vertigo   ? PT helped  ? ?Past Surgical History:  ?Procedure Laterality Date  ? ABDOMINAL HYSTERECTOMY  2000  ? left oophorectomy, Dr Mia Creek  ? APPENDECTOMY  2000  ? CESAREAN SECTION    ? COLONOSCOPY    ? ESOPHAGOGASTRODUODENOSCOPY    ? EUS N/A 12/04/2017  ? Procedure: ESOPHAGEAL ENDOSCOPIC ULTRASOUND (EUS) RADIAL;  Surgeon: Bearl Mulberry, MD;  Location: [facility] ENDOSCOPY;  Service: Gastroenterology;  Laterality: N/A;  ? EXPLORATORY LAPAROTOMY    ? IMAGE GUIDED SINUS SURGERY Bilateral 08/29/2016  ? Procedure: IMAGE GUIDED SINUS SURGERY;  Surgeon: Vernie Murders, MD;  Location: [facility];  Service: ENT;  Laterality: Bilateral;   NEED DISK ?GAVE DISK TO CECE 1-23 KP  ? LITHOTRIPSY    ? RIGHT OOPHORECTOMY  2002  ? SEPTOPLASTY Bilateral 08/29/2016  ? Procedure: SEPTOPLASTY;  Surgeon: Vernie Murders, MD;  Location: [facility];  Service: ENT;  Laterality: Bilateral;  ? SPHENOIDECTOMY Right 08/29/2016  ? Procedure: SPHENOIDECTOMY  with removal content right;  Surgeon: Vernie Murders, MD;  Location: [facility]ERY [facility];  Service: ENT;  Laterality: Right;  ? VAGINAL BIRTH AFTER CESAREAN SECTION    ? ?Family History  ?Problem Relation Age of Onset  ? Hypertension Mother   ? Diabetes Mother   ? Thyroid cancer Mother   ? Colon polyps Mother   ? Thyroid cancer Maternal Aunt   ? Colon polyps Maternal Aunt   ? Kidney cancer Maternal Uncle   ? Breast cancer Cousin   ?     maternal  ? ?Social History  ? ?Socioeconomic History  ? Marital status: Married  ?  Spouse name: Not on file  ? Number of children: 2  ? Years of education: Not on file  ? Highest education level: Not on file  ?Occupational History  ? Not on file  ?Tobacco Use  ? Smoking status: Never  ? Smokeless tobacco: Never  ?Vaping Use  ? Vaping Use: Never used  ?Substance and Sexual Activity  ? Alcohol use: No  ?  Alcohol/week: 0.0 standard drinks  ? Drug use: No  ? Sexual activity: Not on file  ?  Comment: Married   ?Other Topics Concern  ? Not on file  ?Social History Narrative  ? Not on file  ? ?Social Determinants of Health  ? ?Financial Resource Strain: Not on file  ?Food Insecurity: Not on file  ?Transportation Needs: Not on file  ?Physical Activity: Not on file  ?Stress: Not on file  ?Social Connections: Not on file  ? ? ? ?Review of Systems  ?Constitutional:  Negative for appetite change and unexpected weight change.  ?HENT:  Negative for congestion and sinus pressure.   ?Respiratory:  Negative for cough, chest tightness and shortness of breath.   ?Cardiovascular:  Negative for chest pain, palpitations and leg swelling.  ?Gastrointestinal:  Negative for abdominal pain, diarrhea, nausea  and vomiting.  ?     Acid reflux as outlined.   ?Genitourinary:  Negative for difficulty urinating and dysuria.  ?Musculoskeletal:  Negative for joint swelling and myalgias.  ?Skin:  Negative for color change and rash.  ?Neurological:  Negative for dizziness, light-headedness and headaches.  ?Psychiatric/Behavioral:  Negative for agitation and dysphoric mood.   ? ?   ?Objective:  ?  ? ?BP 128/72   Pulse 81   Temp 97.9 ?F (36.6 ?C)   Resp 16   Ht 5\' 6"  (1.676 m)   Wt 238 lb (108 kg)   LMP 08/27/1998   SpO2 98%   BMI 38.41 kg/m?  ?Wt Readings from Last 3 Encounters:  ?10/12/21 238 lb (108 kg)  ?06/29/21 220 lb 12 oz (100.1 kg)  ?04/13/21 220 lb 12.8 oz (100.2 kg)  ? ? ?Physical Exam ?Vitals reviewed.  ?Constitutional:   ?   General: She is not in acute distress. ?   Appearance: Normal appearance.  ?HENT:  ?   Head: Normocephalic and atraumatic.  ?   Right Ear: External ear normal.  ?   Left Ear: External ear normal.  ?Eyes:  ?   General: No scleral icterus.    ?   Right eye: No discharge.     ?   Left eye: No discharge.  ?   Conjunctiva/sclera: Conjunctivae normal.  ?Neck:  ?   Thyroid: No thyromegaly.  ?Cardiovascular:  ?   Rate and Rhythm: Normal rate and regular rhythm.  ?Pulmonary:  ?   Effort: No respiratory distress.  ?   Breath sounds: Normal breath sounds. No wheezing.  ?Abdominal:  ?   General: Bowel sounds are normal.  ?   Palpations: Abdomen is soft.  ?   Tenderness: There is no abdominal tenderness.  ?Musculoskeletal:     ?   General: No swelling or tenderness.  ?   Cervical back: Neck supple. No tenderness.  ?Lymphadenopathy:  ?   Cervical: No cervical adenopathy.  ?Skin: ?   Findings: No erythema or rash.  ?Neurological:  ?   Mental Status: She is alert.  ?Psychiatric:     ?   Mood and Affect: Mood normal.     ?   Behavior: Behavior normal.  ? ? ? ?Outpatient Encounter Medications as of 10/12/2021  ?Medication Sig  ? scopolamine (TRANSDERM-SCOP) 1 MG/3DAYS Place 1 patch (1.5 mg total) onto the  skin every 3 (three) days.  ? Multiple Vitamins-Minerals (WOMENS MULTIVITAMIN PO) Take by mouth.  ? pantoprazole (PROTONIX) 40 MG tablet Take 1 tablet (40 mg total) by mouth 2 (two) times daily before a meal.  ? Probiotic  Product (PHILLIPS COLON HEALTH PO) Take by mouth.  ? [DISCONTINUED] pantoprazole (PROTONIX) 40 MG tablet Take one tablet 30 minutes before breakfast  ? [DISCONTINUED] predniSONE (DELTASONE) 10 MG tablet Take 4 tablets x 1 day and then decrease by 1/2 tablet per day until down to zero mg.  ? [DISCONTINUED] triamcinolone (NASACORT) 55 MCG/ACT AERO nasal inhaler Place 2 sprays into the nose daily.  ? ?No facility-administered encounter medications on file as of 10/12/2021.  ?  ? ?Lab Results  ?Component Value Date  ? WBC 5.9 10/12/2021  ? HGB 12.9 10/12/2021  ? HCT 39.4 10/12/2021  ? PLT 179 10/12/2021  ? GLUCOSE 100 (H) 10/12/2021  ? CHOL 195 10/12/2021  ? TRIG 79 10/12/2021  ? HDL 57 10/12/2021  ? LDLCALC 124 (H) 10/12/2021  ? ALT 37 (H) 10/12/2021  ? AST 32 10/12/2021  ? NA 139 10/12/2021  ? K 4.4 10/12/2021  ? CL 105 10/12/2021  ? CREATININE 0.65 10/12/2021  ? BUN 14 10/12/2021  ? CO2 21 10/12/2021  ? TSH 1.850 10/12/2021  ? HGBA1C 4.9 09/29/2018  ? ? ?MM 3D SCREEN BREAST BILATERAL ? ?Result Date: 05/31/2021 ?CLINICAL DATA:  Screening. EXAM: DIGITAL SCREENING BILATERAL MAMMOGRAM WITH TOMOSYNTHESIS AND CAD TECHNIQUE: Bilateral screening digital craniocaudal and mediolateral oblique mammograms were obtained. Bilateral screening digital breast tomosynthesis was performed. The images were evaluated with computer-aided detection. COMPARISON:  Previous exam(s). ACR Breast Density Category b: There are scattered areas of fibroglandular density. FINDINGS: There are no findings suspicious for malignancy. IMPRESSION: No mammographic evidence of malignancy. A result letter of this screening mammogram will be mailed directly to the patient. RECOMMENDATION: Screening mammogram in one year. (Code:SM-B-01Y)  BI-RADS CATEGORY  1: Negative. Electronically Signed   By: Emmaline Kluver M.D.   On: 05/31/2021 11:22 ? ? ?   ?Assessment & Plan:  ? ?Problem List Items Addressed This Visit   ? ? Abnormal liver function tests  ?  Dia

## 2021-10-13 ENCOUNTER — Encounter: Payer: Self-pay | Admitting: Internal Medicine

## 2021-10-13 DIAGNOSIS — Z7184 Encounter for health counseling related to travel: Secondary | ICD-10-CM | POA: Insufficient documentation

## 2021-10-13 LAB — BASIC METABOLIC PANEL
BUN/Creatinine Ratio: 22 (ref 9–23)
BUN: 14 mg/dL (ref 6–24)
CO2: 21 mmol/L (ref 20–29)
Calcium: 9 mg/dL (ref 8.7–10.2)
Chloride: 105 mmol/L (ref 96–106)
Creatinine, Ser: 0.65 mg/dL (ref 0.57–1.00)
Glucose: 100 mg/dL — ABNORMAL HIGH (ref 70–99)
Potassium: 4.4 mmol/L (ref 3.5–5.2)
Sodium: 139 mmol/L (ref 134–144)
eGFR: 105 mL/min/{1.73_m2} (ref >59)

## 2021-10-13 LAB — LIPID PANEL
Chol/HDL Ratio: 3.4 ratio (ref 0.0–4.4)
Cholesterol, Total: 195 mg/dL (ref 100–199)
HDL: 57 mg/dL (ref >39)
LDL Chol Calc (NIH): 124 mg/dL — ABNORMAL HIGH (ref 0–99)
Triglycerides: 79 mg/dL (ref 0–149)
VLDL Cholesterol Cal: 14 mg/dL (ref 5–40)

## 2021-10-13 LAB — CBC WITH DIFFERENTIAL/PLATELET
Basophils Absolute: 0 10*3/uL (ref 0.0–0.2)
Basos: 1 %
EOS (ABSOLUTE): 0.1 10*3/uL (ref 0.0–0.4)
Eos: 2 %
Hematocrit: 39.4 % (ref 34.0–46.6)
Hemoglobin: 12.9 g/dL (ref 11.1–15.9)
Immature Grans (Abs): 0 10*3/uL (ref 0.0–0.1)
Immature Granulocytes: 0 %
Lymphocytes Absolute: 1.4 10*3/uL (ref 0.7–3.1)
Lymphs: 23 %
MCH: 28.8 pg (ref 26.6–33.0)
MCHC: 32.7 g/dL (ref 31.5–35.7)
MCV: 88 fL (ref 79–97)
Monocytes Absolute: 0.3 10*3/uL (ref 0.1–0.9)
Monocytes: 6 %
Neutrophils Absolute: 4 10*3/uL (ref 1.4–7.0)
Neutrophils: 68 %
Platelets: 179 10*3/uL (ref 150–450)
RBC: 4.48 x10E6/uL (ref 3.77–5.28)
RDW: 13.7 % (ref 11.7–15.4)
WBC: 5.9 10*3/uL (ref 3.4–10.8)

## 2021-10-13 LAB — HEPATIC FUNCTION PANEL
ALT: 37 IU/L — ABNORMAL HIGH (ref 0–32)
AST: 32 IU/L (ref 0–40)
Albumin: 4.5 g/dL (ref 3.8–4.9)
Alkaline Phosphatase: 139 IU/L — ABNORMAL HIGH (ref 44–121)
Bilirubin Total: 0.7 mg/dL (ref 0.0–1.2)
Bilirubin, Direct: 0.2 mg/dL (ref 0.00–0.40)
Total Protein: 6.7 g/dL (ref 6.0–8.5)

## 2021-10-13 LAB — TSH: TSH: 1.85 u[IU]/mL (ref 0.450–4.500)

## 2021-10-13 LAB — HIV ANTIBODY (ROUTINE TESTING W REFLEX): HIV Screen 4th Generation wRfx: NONREACTIVE

## 2021-10-13 LAB — HEPATITIS C ANTIBODY: Hep C Virus Ab: NONREACTIVE

## 2021-10-13 NOTE — Assessment & Plan Note (Signed)
S/p hysterectomy.  Previous pap with ASCUS.  Saw Dr Ward.  S/p colposcopy.  Recommended pap in 3 years.  PAP 04/16/21 - negative with negative HPV.  

## 2021-10-13 NOTE — Assessment & Plan Note (Signed)
Planning to take a cruise.  Discussed scolpolamine patches.   ?

## 2021-10-13 NOTE — Assessment & Plan Note (Signed)
Increased stress as outlined.  Discussed. Will notify me if feels needs anything more.  ?

## 2021-10-13 NOTE — Assessment & Plan Note (Signed)
Is s/p EGD 10/2019.  On protonix.  Breakthrough symptoms as outlined.  On protonix.  Increase to bid.  Follow.   ?

## 2021-10-13 NOTE — Assessment & Plan Note (Signed)
Saw ortho.  Bursitis.  Did not tolerate celebrex.  F/u with ortho ?

## 2021-10-13 NOTE — Assessment & Plan Note (Signed)
Colonoscopy 10/2019.  Recommended f/u colonoscopy in 5 years.  

## 2021-10-13 NOTE — Assessment & Plan Note (Signed)
Diagnosed with NAFLD.  Has been evaluated by GI.  Continue diet and exercise.  Follow liver function tests.   ?

## 2021-10-15 ENCOUNTER — Telehealth: Payer: Self-pay

## 2021-10-15 NOTE — Telephone Encounter (Signed)
Mychart msg sent

## 2021-10-19 DIAGNOSIS — M25512 Pain in left shoulder: Secondary | ICD-10-CM | POA: Diagnosis not present

## 2021-10-24 DIAGNOSIS — M25512 Pain in left shoulder: Secondary | ICD-10-CM | POA: Diagnosis not present

## 2021-10-30 DIAGNOSIS — M19012 Primary osteoarthritis, left shoulder: Secondary | ICD-10-CM | POA: Diagnosis not present

## 2021-11-06 ENCOUNTER — Encounter: Payer: Self-pay | Admitting: Internal Medicine

## 2021-11-07 ENCOUNTER — Ambulatory Visit: Payer: BC Managed Care – PPO | Admitting: Internal Medicine

## 2021-11-07 ENCOUNTER — Encounter: Payer: Self-pay | Admitting: Internal Medicine

## 2021-11-07 VITALS — BP 110/74 | HR 92 | Ht 66.0 in | Wt 239.0 lb

## 2021-11-07 DIAGNOSIS — R300 Dysuria: Secondary | ICD-10-CM

## 2021-11-07 DIAGNOSIS — N3941 Urge incontinence: Secondary | ICD-10-CM | POA: Diagnosis not present

## 2021-11-07 NOTE — Patient Instructions (Addendum)
Consider ct scan ab/pelvis  ?Urinary Tract Infection, Adult ? ?A urinary tract infection (UTI) is an infection of any part of the urinary tract. The urinary tract includes the kidneys, ureters, bladder, and urethra. These organs make, store, and get rid of urine in the body. ?An upper UTI affects the ureters and kidneys. A lower UTI affects the bladder and urethra. ?What are the causes? ?Most urinary tract infections are caused by bacteria in your genital area around your urethra, where urine leaves your body. These bacteria grow and cause inflammation of your urinary tract. ?What increases the risk? ?You are more likely to develop this condition if: ?You have a urinary catheter that stays in place. ?You are not able to control when you urinate or have a bowel movement (incontinence). ?You are female and you: ?Use a spermicide or diaphragm for birth control. ?Have low estrogen levels. ?Are pregnant. ?You have certain genes that increase your risk. ?You are sexually active. ?You take antibiotic medicines. ?You have a condition that causes your flow of urine to slow down, such as: ?An enlarged prostate, if you are female. ?Blockage in your urethra. ?A kidney stone. ?A nerve condition that affects your bladder control (neurogenic bladder). ?Not getting enough to drink, or not urinating often. ?You have certain medical conditions, such as: ?Diabetes. ?A weak disease-fighting system (immunesystem). ?Sickle cell disease. ?Gout. ?Spinal cord injury. ?What are the signs or symptoms? ?Symptoms of this condition include: ?Needing to urinate right away (urgency). ?Frequent urination. This may include small amounts of urine each time you urinate. ?Pain or burning with urination. ?Blood in the urine. ?Urine that smells bad or unusual. ?Trouble urinating. ?Cloudy urine. ?Vaginal discharge, if you are female. ?Pain in the abdomen or the lower back. ?You may also have: ?Vomiting or a decreased appetite. ?Confusion. ?Irritability or  tiredness. ?A fever or chills. ?Diarrhea. ?The first symptom in older adults may be confusion. In some cases, they may not have any symptoms until the infection has worsened. ?How is this diagnosed? ?This condition is diagnosed based on your medical history and a physical exam. You may also have other tests, including: ?Urine tests. ?Blood tests. ?Tests for STIs (sexually transmitted infections). ?If you have had more than one UTI, a cystoscopy or imaging studies may be done to determine the cause of the infections. ?How is this treated? ?Treatment for this condition includes: ?Antibiotic medicine. ?Over-the-counter medicines to treat discomfort. ?Drinking enough water to stay hydrated. ?If you have frequent infections or have other conditions such as a kidney stone, you may need to see a health care provider who specializes in the urinary tract (urologist). ?In rare cases, urinary tract infections can cause sepsis. Sepsis is a life-threatening condition that occurs when the body responds to an infection. Sepsis is treated in the hospital with IV antibiotics, fluids, and other medicines. ?Follow these instructions at home: ? ?Medicines ?Take over-the-counter and prescription medicines only as told by your health care provider. ?If you were prescribed an antibiotic medicine, take it as told by your health care provider. Do not stop using the antibiotic even if you start to feel better. ?General instructions ?Make sure you: ?Empty your bladder often and completely. Do not hold urine for long periods of time. ?Empty your bladder after sex. ?Wipe from front to back after urinating or having a bowel movement if you are female. Use each tissue only one time when you wipe. ?Drink enough fluid to keep your urine pale yellow. ?Keep all follow-up visits. This  is important. ?Contact a health care provider if: ?Your symptoms do not get better after 1-2 days. ?Your symptoms go away and then return. ?Get help right away if: ?You  have severe pain in your back or your lower abdomen. ?You have a fever or chills. ?You have nausea or vomiting. ?Summary ?A urinary tract infection (UTI) is an infection of any part of the urinary tract, which includes the kidneys, ureters, bladder, and urethra. ?Most urinary tract infections are caused by bacteria in your genital area. ?Treatment for this condition often includes antibiotic medicines. ?If you were prescribed an antibiotic medicine, take it as told by your health care provider. Do not stop using the antibiotic even if you start to feel better. ?Keep all follow-up visits. This is important. ?This information is not intended to replace advice given to you by your health care provider. Make sure you discuss any questions you have with your health care provider. ?Document Revised: 02/18/2020 Document Reviewed: 02/18/2020 ?Elsevier Patient Education ? 2023 [employer] ? ?Dietary Guidelines to Help Prevent Kidney Stones ?Kidney stones are deposits of minerals and salts that form inside your kidneys. Your risk of developing kidney stones may be greater depending on your diet, your lifestyle, the medicines you take, and whether you have certain medical conditions. Most people can lower their chances of developing kidney stones by following the instructions below. Your dietitian may give you more specific instructions depending on your overall health and the type of kidney stones you tend to develop. ?What are tips for following this plan? ?Reading food labels ? ?Choose foods with "no salt added" or "low-salt" labels. Limit your salt (sodium) intake to less than 1,500 mg a day. ?Choose foods with calcium for each meal and snack. Try to eat about 300 mg of calcium at each meal. Foods that contain 200-500 mg of calcium a serving include: ?8 oz (237 mL) of milk, calcium-fortifiednon-dairy milk, and calcium-fortifiedfruit juice. Calcium-fortified means that calcium has been added to these drinks. ?8 oz (237  mL) of kefir, yogurt, and soy yogurt. ?4 oz (114 g) of tofu. ?1 oz (28 g) of cheese. ?1 cup (150 g) of dried figs. ?1 cup (91 g) of cooked broccoli. ?One 3 oz (85 g) can of sardines or mackerel. ?Most people need 1,000-1,500 mg of calcium a day. Talk to your dietitian about how much calcium is recommended for you. ?Shopping ?Buy plenty of fresh fruits and vegetables. Most people do not need to avoid fruits and vegetables, even if these foods contain nutrients that may contribute to kidney stones. ?When shopping for convenience foods, choose: ?Whole pieces of fruit. ?Pre-made salads with dressing on the side. ?Low-fat fruit and yogurt smoothies. ?Avoid buying frozen meals or prepared deli foods. These can be high in sodium. ?Look for foods with live cultures, such as yogurt and kefir. ?Choose high-fiber grains, such as whole-wheat breads, oat bran, and wheat cereals. ?Cooking ?Do not add salt to food when cooking. Place a salt shaker on the table and allow each person to add his or her own salt to taste. ?Use vegetable protein, such as beans, textured vegetable protein (TVP), or tofu, instead of meat in pasta, casseroles, and soups. ?Meal planning ?Eat less salt, if told by your dietitian. To do this: ?Avoid eating processed or pre-made food. ?Avoid eating fast food. ?Eat less animal protein, including cheese, meat, poultry, or fish, if told by your dietitian. To do this: ?Limit the number of times you have meat, poultry, fish, or  cheese each week. Eat a diet free of meat at least 2 days a week. ?Eat only one serving each day of meat, poultry, fish, or seafood. ?When you prepare animal protein, cut pieces into small portion sizes. For most meat and fish, one serving is about the size of the palm of your hand. ?Eat at least five servings of fresh fruits and vegetables each day. To do this: ?Keep fruits and vegetables on hand for snacks. ?Eat one piece of fruit or a handful of berries with breakfast. ?Have a salad and  fruit at lunch. ?Have two kinds of vegetables at dinner. ?Limit foods that are high in a substance called oxalate. These include: ?Spinach (cooked), rhubarb, beets, sweet potatoes, and Swiss chard. ?Pean

## 2021-11-07 NOTE — Telephone Encounter (Signed)
Scheduled per vanessa ?

## 2021-11-07 NOTE — Progress Notes (Signed)
Chief Complaint  ?Patient presents with  ? Urinary Tract Infection  ? ?Acute  ?1. Urine urgency with little output h/o kidney stones and uti and wants to get checked no other sx's burning with urination, back pain, discharge daughter is cma and wanted her to get checked  ?Sxs started late sunday ? ?Review of Systems  ?Constitutional:  Negative for weight loss.  ?HENT:  Negative for hearing loss.   ?Eyes:  Negative for blurred vision.  ?Respiratory:  Negative for shortness of breath.   ?Cardiovascular:  Negative for chest pain.  ?Gastrointestinal:  Negative for abdominal pain and blood in stool.  ?Genitourinary:  Positive for urgency. Negative for dysuria.  ?Musculoskeletal:  Negative for falls and joint pain.  ?Skin:  Negative for rash.  ?Neurological:  Negative for headaches.  ?Psychiatric/Behavioral:  Negative for depression.   ?Past Medical History:  ?Diagnosis Date  ? Anemia   ? Endometriosis   ? Erosive gastritis   ? GERD (gastroesophageal reflux disease)   ? History of kidney stones   ? Hyperplastic colon polyp   ? Irritable bowel syndrome   ? Kidney stones   ? Migraines   ? Nonalcoholic fatty liver disease   ? Ovarian cyst   ? Positional vertigo   ? PT helped  ? ?Past Surgical History:  ?Procedure Laterality Date  ? ABDOMINAL HYSTERECTOMY  2000  ? left oophorectomy, Dr Burke Keels  ? APPENDECTOMY  2000  ? CESAREAN SECTION    ? COLONOSCOPY    ? ESOPHAGOGASTRODUODENOSCOPY    ? EUS N/A 12/04/2017  ? Procedure: ESOPHAGEAL ENDOSCOPIC ULTRASOUND (EUS) RADIAL;  Surgeon: Holly Bodily, MD;  Location: [facility] ENDOSCOPY;  Service: Gastroenterology;  Laterality: N/A;  ? EXPLORATORY LAPAROTOMY    ? IMAGE GUIDED SINUS SURGERY Bilateral 08/29/2016  ? Procedure: IMAGE GUIDED SINUS SURGERY;  Surgeon: Margaretha Sheffield, MD;  Location: [place];  Service: ENT;  Laterality: Bilateral;  NEED DISK ?GAVE DISK TO CECE 1-23 KP  ? LITHOTRIPSY    ? RIGHT OOPHORECTOMY  2002  ? SEPTOPLASTY Bilateral 08/29/2016  ? Procedure:  SEPTOPLASTY;  Surgeon: Margaretha Sheffield, MD;  Location: [place];  Service: ENT;  Laterality: Bilateral;  ? SPHENOIDECTOMY Right 08/29/2016  ? Procedure: SPHENOIDECTOMY  with removal content right;  Surgeon: Margaretha Sheffield, MD;  Location: [place];  Service: ENT;  Laterality: Right;  ? VAGINAL BIRTH AFTER CESAREAN SECTION    ? ?Family History  ?Problem Relation Age of Onset  ? Hypertension Mother   ? Diabetes Mother   ? Thyroid cancer Mother   ? Colon polyps Mother   ? Thyroid cancer Maternal Aunt   ? Colon polyps Maternal Aunt   ? Kidney cancer Maternal Uncle   ? Breast cancer Cousin   ?     maternal  ? ?Social History  ? ?Socioeconomic History  ? Marital status: Married  ?  Spouse name: Not on file  ? Number of children: 2  ? Years of education: Not on file  ? Highest education level: Not on file  ?Occupational History  ? Not on file  ?Tobacco Use  ? Smoking status: Never  ? Smokeless tobacco: Never  ?Vaping Use  ? Vaping Use: Never used  ?Substance and Sexual Activity  ? Alcohol use: No  ?  Alcohol/week: 0.0 standard drinks  ? Drug use: No  ? Sexual activity: Not on file  ?  Comment: Married   ?Other Topics Concern  ? Not on file  ?Social History Narrative  ?  Not on file  ? ?Social Determinants of Health  ? ?Financial Resource Strain: Not on file  ?Food Insecurity: Not on file  ?Transportation Needs: Not on file  ?Physical Activity: Not on file  ?Stress: Not on file  ?Social Connections: Not on file  ?Intimate Partner Violence: Not on file  ? ?Current Meds  ?Medication Sig  ? Multiple Vitamins-Minerals (WOMENS MULTIVITAMIN PO) Take by mouth.  ? pantoprazole (PROTONIX) 40 MG tablet Take 1 tablet (40 mg total) by mouth 2 (two) times daily before a meal.  ? Probiotic Product ([place]) Take by mouth.  ? ?Allergies  ?Allergen Reactions  ? Doxycycline Diarrhea and Nausea And Vomiting  ?     ? Levaquin [Levofloxacin]   ?  Joint pain  ? Shellfish Allergy Nausea And Vomiting  ? Amoxicillin  Rash  ? Codeine Other (See Comments)  ?  Makes her feel disoriented  ? Contrast Media [Iodinated Contrast Media] Rash  ? Penicillins Rash  ?  Has patient had a PCN reaction causing immediate rash, facial/tongue/throat swelling, SOB or lightheadedness with hypotension: Yes ?Has patient had a PCN reaction causing severe rash involving mucus membranes or skin necrosis: No ?Has patient had a PCN reaction that required hospitalization: No ?Has patient had a PCN reaction occurring within the last 10 years: Yes ?If all of the above answers are "NO", then may proceed with Cephalosporin use. ? ?**No issues with CEFTIN**  ? ?Recent Results (from the past 2160 hour(s))  ?Basic metabolic panel     Status: Abnormal  ? Collection Time: 10/12/21  9:22 AM  ?Result Value Ref Range  ? Glucose 100 (H) 70 - 99 mg/dL  ? BUN 14 6 - 24 mg/dL  ? Creatinine, Ser 0.65 0.57 - 1.00 mg/dL  ? eGFR 105 >59 mL/min/1.73  ? BUN/Creatinine Ratio 22 9 - 23  ? Sodium 139 134 - 144 mmol/L  ? Potassium 4.4 3.5 - 5.2 mmol/L  ? Chloride 105 96 - 106 mmol/L  ? CO2 21 20 - 29 mmol/L  ? Calcium 9.0 8.7 - 10.2 mg/dL  ?CBC with Differential/Platelet     Status: None  ? Collection Time: 10/12/21  9:22 AM  ?Result Value Ref Range  ? WBC 5.9 3.4 - 10.8 x10E3/uL  ? RBC 4.48 3.77 - 5.28 x10E6/uL  ? Hemoglobin 12.9 11.1 - 15.9 g/dL  ? Hematocrit 39.4 34.0 - 46.6 %  ? MCV 88 79 - 97 fL  ? MCH 28.8 26.6 - 33.0 pg  ? MCHC 32.7 31.5 - 35.7 g/dL  ? RDW 13.7 11.7 - 15.4 %  ? Platelets 179 150 - 450 x10E3/uL  ? Neutrophils 68 Not Estab. %  ? Lymphs 23 Not Estab. %  ? Monocytes 6 Not Estab. %  ? Eos 2 Not Estab. %  ? Basos 1 Not Estab. %  ? Neutrophils Absolute 4.0 1.4 - 7.0 x10E3/uL  ? Lymphocytes Absolute 1.4 0.7 - 3.1 x10E3/uL  ? Monocytes Absolute 0.3 0.1 - 0.9 x10E3/uL  ? EOS (ABSOLUTE) 0.1 0.0 - 0.4 x10E3/uL  ? Basophils Absolute 0.0 0.0 - 0.2 x10E3/uL  ? Immature Granulocytes 0 Not Estab. %  ? Immature Grans (Abs) 0.0 0.0 - 0.1 x10E3/uL  ?Hepatic function panel      Status: Abnormal  ? Collection Time: 10/12/21  9:22 AM  ?Result Value Ref Range  ? Total Protein 6.7 6.0 - 8.5 g/dL  ? Albumin 4.5 3.8 - 4.9 g/dL  ? Bilirubin Total 0.7 0.0 -  1.2 mg/dL  ? Bilirubin, Direct 0.20 0.00 - 0.40 mg/dL  ? Alkaline Phosphatase 139 (H) 44 - 121 IU/L  ? AST 32 0 - 40 IU/L  ? ALT 37 (H) 0 - 32 IU/L  ?Hepatitis C Antibody     Status: None  ? Collection Time: 10/12/21  9:22 AM  ?Result Value Ref Range  ? Hep C Virus Ab Non Reactive Non Reactive  ?  Comment: HCV antibody alone does not differentiate between previously ?resolved infection and active infection. Equivocal and Reactive ?HCV antibody results should be followed up with an HCV RNA test ?to support the diagnosis of active HCV infection. ?  ?HIV antibody (with reflex)     Status: None  ? Collection Time: 10/12/21  9:22 AM  ?Result Value Ref Range  ? HIV Screen 4th Generation wRfx Non Reactive Non Reactive  ?  Comment: HIV Negative ?HIV-1/HIV-2 antibodies and HIV-1 p24 antigen were NOT detected. ?There is no laboratory evidence of HIV infection. ?  ?Lipid panel     Status: Abnormal  ? Collection Time: 10/12/21  9:22 AM  ?Result Value Ref Range  ? Cholesterol, Total 195 100 - 199 mg/dL  ? Triglycerides 79 0 - 149 mg/dL  ? HDL 57 >39 mg/dL  ? VLDL Cholesterol Cal 14 5 - 40 mg/dL  ? LDL Chol Calc (NIH) 124 (H) 0 - 99 mg/dL  ? Chol/HDL Ratio 3.4 0.0 - 4.4 ratio  ?  Comment:                                   T. Chol/HDL Ratio ?                                            Men  Women ?                              1/2 Avg.Risk  3.4    3.3 ?                                  Avg.Risk  5.0    4.4 ?                               2X Avg.Risk  9.6    7.1 ?                               3X Avg.Risk 23.4   11.0 ?  ?TSH     Status: None  ? Collection Time: 10/12/21  9:22 AM  ?Result Value Ref Range  ? TSH 1.850 0.450 - 4.500 uIU/mL  ? ?Objective  ?Body mass index is 38.58 kg/m?. ?Wt Readings from Last 3 Encounters:  ?11/07/21 239 lb (108.4 kg)  ?10/12/21  238 lb (108 kg)  ?06/29/21 220 lb 12 oz (100.1 kg)  ? ?Temp Readings from Last 3 Encounters:  ?10/12/21 97.9 ?F (36.6 ?C)  ?06/29/21 99.5 ?F (37.5 ?C) (Oral)  ?04/13/21 97.8 ?F (36.6 ?C)  ? ?BP Readings from Last 3

## 2021-11-08 LAB — MICROSCOPIC MESSAGE

## 2021-11-08 LAB — URINALYSIS, ROUTINE W REFLEX MICROSCOPIC
Bacteria, UA: NONE SEEN /HPF
Bilirubin Urine: NEGATIVE
Glucose, UA: NEGATIVE
Hyaline Cast: NONE SEEN /LPF
Ketones, ur: NEGATIVE
Leukocytes,Ua: NEGATIVE
Nitrite: NEGATIVE
Protein, ur: NEGATIVE
RBC / HPF: NONE SEEN /HPF (ref 0–2)
Specific Gravity, Urine: 1.021 (ref 1.001–1.035)
pH: 6 (ref 5.0–8.0)

## 2021-11-08 LAB — URINE CULTURE
MICRO NUMBER:: 13284581
Result:: NO GROWTH
SPECIMEN QUALITY:: ADEQUATE

## 2021-11-13 DIAGNOSIS — M19012 Primary osteoarthritis, left shoulder: Secondary | ICD-10-CM | POA: Diagnosis not present

## 2021-12-03 DIAGNOSIS — M19012 Primary osteoarthritis, left shoulder: Secondary | ICD-10-CM | POA: Diagnosis not present

## 2021-12-07 ENCOUNTER — Ambulatory Visit: Payer: BC Managed Care – PPO | Admitting: Internal Medicine

## 2021-12-12 DIAGNOSIS — M19012 Primary osteoarthritis, left shoulder: Secondary | ICD-10-CM | POA: Diagnosis not present

## 2021-12-21 DIAGNOSIS — M19012 Primary osteoarthritis, left shoulder: Secondary | ICD-10-CM | POA: Diagnosis not present

## 2021-12-21 DIAGNOSIS — M25612 Stiffness of left shoulder, not elsewhere classified: Secondary | ICD-10-CM | POA: Diagnosis not present

## 2021-12-25 DIAGNOSIS — M25612 Stiffness of left shoulder, not elsewhere classified: Secondary | ICD-10-CM | POA: Diagnosis not present

## 2021-12-25 DIAGNOSIS — M19012 Primary osteoarthritis, left shoulder: Secondary | ICD-10-CM | POA: Diagnosis not present

## 2021-12-31 DIAGNOSIS — M25612 Stiffness of left shoulder, not elsewhere classified: Secondary | ICD-10-CM | POA: Diagnosis not present

## 2021-12-31 DIAGNOSIS — M19012 Primary osteoarthritis, left shoulder: Secondary | ICD-10-CM | POA: Diagnosis not present

## 2022-01-07 DIAGNOSIS — M19012 Primary osteoarthritis, left shoulder: Secondary | ICD-10-CM | POA: Diagnosis not present

## 2022-01-07 DIAGNOSIS — M25612 Stiffness of left shoulder, not elsewhere classified: Secondary | ICD-10-CM | POA: Diagnosis not present

## 2022-01-11 ENCOUNTER — Ambulatory Visit: Payer: BC Managed Care – PPO | Admitting: Internal Medicine

## 2022-01-11 ENCOUNTER — Encounter: Payer: Self-pay | Admitting: Internal Medicine

## 2022-01-11 DIAGNOSIS — R7989 Other specified abnormal findings of blood chemistry: Secondary | ICD-10-CM

## 2022-01-11 DIAGNOSIS — N809 Endometriosis, unspecified: Secondary | ICD-10-CM

## 2022-01-11 DIAGNOSIS — Z8371 Family history of colonic polyps: Secondary | ICD-10-CM | POA: Diagnosis not present

## 2022-01-11 DIAGNOSIS — K219 Gastro-esophageal reflux disease without esophagitis: Secondary | ICD-10-CM

## 2022-01-11 DIAGNOSIS — Z862 Personal history of diseases of the blood and blood-forming organs and certain disorders involving the immune mechanism: Secondary | ICD-10-CM

## 2022-01-11 DIAGNOSIS — N951 Menopausal and female climacteric states: Secondary | ICD-10-CM

## 2022-01-11 DIAGNOSIS — Z86010 Personal history of colonic polyps: Secondary | ICD-10-CM

## 2022-01-13 ENCOUNTER — Encounter: Payer: Self-pay | Admitting: Internal Medicine

## 2022-01-13 NOTE — Assessment & Plan Note (Signed)
Colonoscopy 10/2019.  

## 2022-01-23 DIAGNOSIS — M19012 Primary osteoarthritis, left shoulder: Secondary | ICD-10-CM | POA: Diagnosis not present

## 2022-01-23 DIAGNOSIS — M25612 Stiffness of left shoulder, not elsewhere classified: Secondary | ICD-10-CM | POA: Diagnosis not present

## 2022-01-29 ENCOUNTER — Telehealth: Payer: Self-pay | Admitting: Internal Medicine

## 2022-01-29 MED ORDER — PANTOPRAZOLE SODIUM 40 MG PO TBEC: 40.0000 mg | DELAYED_RELEASE_TABLET | Freq: Two times a day (BID) | ORAL | 1 refills | Status: DC

## 2022-01-29 NOTE — Telephone Encounter (Signed)
Pt called in requesting refill on medication (pantoprazole (PROTONIX) 40 MG tablet)... Pt stated that she called her [facility] and they advised her that they didn't receive the script electronically... Pt would like for the script to be sent to [facility]... Pt requesting callback.Marland KitchenMarland KitchenMarland Kitchen

## 2022-01-29 NOTE — Telephone Encounter (Signed)
Medication has been refilled.

## 2022-02-18 DIAGNOSIS — M19012 Primary osteoarthritis, left shoulder: Secondary | ICD-10-CM | POA: Diagnosis not present

## 2022-02-18 DIAGNOSIS — M25612 Stiffness of left shoulder, not elsewhere classified: Secondary | ICD-10-CM | POA: Diagnosis not present

## 2022-03-15 DIAGNOSIS — H66002 Acute suppurative otitis media without spontaneous rupture of ear drum, left ear: Secondary | ICD-10-CM | POA: Diagnosis not present

## 2022-03-15 DIAGNOSIS — Z889 Allergy status to unspecified drugs, medicaments and biological substances status: Secondary | ICD-10-CM | POA: Diagnosis not present

## 2022-03-15 DIAGNOSIS — J019 Acute sinusitis, unspecified: Secondary | ICD-10-CM | POA: Diagnosis not present

## 2022-03-15 DIAGNOSIS — B9689 Other specified bacterial agents as the cause of diseases classified elsewhere: Secondary | ICD-10-CM | POA: Diagnosis not present

## 2022-03-19 ENCOUNTER — Other Ambulatory Visit (HOSPITAL_COMMUNITY): Payer: Self-pay | Admitting: Gastroenterology

## 2022-03-19 ENCOUNTER — Other Ambulatory Visit: Payer: Self-pay | Admitting: Gastroenterology

## 2022-03-19 DIAGNOSIS — K760 Fatty (change of) liver, not elsewhere classified: Secondary | ICD-10-CM

## 2022-03-19 DIAGNOSIS — K219 Gastro-esophageal reflux disease without esophagitis: Secondary | ICD-10-CM | POA: Diagnosis not present

## 2022-03-27 ENCOUNTER — Encounter: Payer: Self-pay | Admitting: Family

## 2022-03-27 ENCOUNTER — Ambulatory Visit: Payer: BC Managed Care – PPO | Admitting: Family

## 2022-03-27 VITALS — BP 126/80 | HR 91 | Temp 98.5°F | Ht 67.0 in | Wt 242.8 lb

## 2022-03-27 DIAGNOSIS — J0100 Acute maxillary sinusitis, unspecified: Secondary | ICD-10-CM

## 2022-03-27 MED ORDER — PREDNISONE 10 MG PO TABS: ORAL_TABLET | ORAL | 0 refills | Status: DC

## 2022-03-27 NOTE — Patient Instructions (Signed)
Start prednisone  Please take all prednisone doses prior to 1 PM each day prednisone can interfere with sleep.  Certainly if you experience palpitations or increased anxiety on prednisone, please let me know and you may stop medication.  Please let me know if symptoms do not completely resolved I would recommend CT sinus study.

## 2022-03-27 NOTE — Progress Notes (Signed)
Went to UC was given Zpack..has not helped. Been taking otc sinusmax day and night. Took Covid test was negative. Just a lot of sinus pressure and drainage

## 2022-03-27 NOTE — Assessment & Plan Note (Signed)
Afebrile. Non toxic in appearance. No improvement with zpak. PCN allergy and SOB reported from PCN, doxycycline would be a choice; reported diarrhea in 2014 however discussed that may or not recur. We agreed trial of prednisone for residual inflammation is reasonable.  Patient politely declines CT sinus ahead of prednisone.  She will consider CT sinus if symptoms do not resolve on prednisone.  Counseled her on side effects of prednisone and how to take medication.  Advised consideration for referral to allergist for formal reevaluation antibiotic allergies.  She politely declines today

## 2022-03-27 NOTE — Progress Notes (Signed)
Subjective:    Patient ID: Katie [name], female    DOB: [birth year], 56 y.o.   MRN: [id number]  CC: Katie [name] is a 56 y.o. female who presents today for an acute visit.    HPI: Complains of sinus drainage, x 12 days, largely unchanged  Endorses PND which feels thick Clear nasal congestion which was previously thick, yellow.   Cough as improved.  No improvement with zpak. No other recent antibiotics prior too.   H/o sinus surgery which was helpful for recurrent sinusitis  Compliant with nasocort without relief. Temporary relief with netti pot, mucinex.   No HA, vision changes, CP, sob, wheezing  History of tonsillectomy    Acute visit for continued sinus congestion.  Seen 03/15/2022 [name] urgent care for acute bacterial sinusitis and started on Z-Pak.  No improvement with azithromycin   Multiple drug allergies including doxycycline, Levaquin and amoxicillin,PCN ( she had SOB)   No ckd HISTORY:  Past Medical History:  Diagnosis Date   Anemia    Endometriosis    Erosive gastritis    GERD (gastroesophageal reflux disease)    History of kidney stones    Hyperplastic colon polyp    Irritable bowel syndrome    Kidney stones    Migraines    Nonalcoholic fatty liver disease    Ovarian cyst    Positional vertigo    PT helped   Past Surgical History:  Procedure Laterality Date   ABDOMINAL HYSTERECTOMY  2000   left oophorectomy, Dr Katie [name]   APPENDECTOMY  2000   CESAREAN SECTION     COLONOSCOPY     ESOPHAGOGASTRODUODENOSCOPY     EUS N/A 12/04/2017   Procedure: ESOPHAGEAL ENDOSCOPIC ULTRASOUND (EUS) RADIAL;  Surgeon: Katie Mulberry, MD;  Location: [facility] ENDOSCOPY;  Service: Gastroenterology;  Laterality: N/A;   EXPLORATORY LAPAROTOMY     IMAGE GUIDED SINUS SURGERY Bilateral 08/29/2016   Procedure: IMAGE GUIDED SINUS SURGERY;  Surgeon: Katie Murders, MD;  Location: [facility];  Service: ENT;  Laterality: Bilateral;  NEED DISK GAVE DISK TO Katie [name] 1-23  KP   LITHOTRIPSY     RIGHT OOPHORECTOMY  2002   SEPTOPLASTY Bilateral 08/29/2016   Procedure: SEPTOPLASTY;  Surgeon: Katie Murders, MD;  Location: [facility];  Service: ENT;  Laterality: Bilateral;   SPHENOIDECTOMY Right 08/29/2016   Procedure: SPHENOIDECTOMY  with removal content right;  Surgeon: Katie Murders, MD;  Location: [facility];  Service: ENT;  Laterality: Right;   VAGINAL BIRTH AFTER CESAREAN SECTION     Family History  Problem Relation Age of Onset   Hypertension Mother    Diabetes Mother    Thyroid cancer Mother    Colon polyps Mother    Thyroid cancer Maternal Aunt    Colon polyps Maternal Aunt    Kidney cancer Maternal Uncle    Breast cancer Cousin        maternal    Allergies: Doxycycline, Levaquin [levofloxacin], Shellfish allergy, Amoxicillin, Codeine, Contrast media [iodinated contrast media], and Penicillins Current Outpatient Medications on File Prior to Visit  Medication Sig Dispense Refill   Multiple Vitamins-Minerals (WOMENS MULTIVITAMIN PO) Take by mouth.     pantoprazole (PROTONIX) 40 MG tablet Take 1 tablet (40 mg total) by mouth 2 (two) times daily before a meal. 180 tablet 1   Probiotic Product (PHILLIPS COLON HEALTH PO) Take by mouth.     scopolamine (TRANSDERM-SCOP) 1 MG/3DAYS Place 1 patch (1.5 mg total) onto the skin every 3 (three) days. (  Patient not taking: Reported on 03/27/2022) 10 patch 0   No current facility-administered medications on file prior to visit.    Social History   Tobacco Use   Smoking status: Never   Smokeless tobacco: Never  Vaping Use   Vaping Use: Never used  Substance Use Topics   Alcohol use: No    Alcohol/week: 0.0 standard drinks of alcohol   Drug use: No    Review of Systems  Constitutional:  Negative for chills and fever.  HENT:  Positive for congestion and sinus pain. Negative for dental problem.   Respiratory:  Negative for cough and shortness of breath.   Cardiovascular:  Negative for chest  pain and palpitations.  Gastrointestinal:  Negative for nausea and vomiting.      Objective:    BP 126/80 (BP Location: Left Arm, Patient Position: Sitting, Cuff Size: Normal)   Pulse 91   Temp 98.5 F (36.9 C) (Oral)   Ht 5\' 7"  (1.702 m)   Wt 242 lb 12.8 oz (110.1 kg)   LMP 08/27/1998   SpO2 97%   BMI 38.03 kg/m    Physical Exam Vitals reviewed.  Constitutional:      Appearance: She is well-developed.  HENT:     Head: Normocephalic and atraumatic.     Right Ear: Hearing, tympanic membrane, ear canal and external ear normal. No decreased hearing noted. No drainage, swelling or tenderness. No middle ear effusion. No foreign body. Tympanic membrane is not erythematous or bulging.     Left Ear: Hearing, tympanic membrane, ear canal and external ear normal. No decreased hearing noted. No drainage, swelling or tenderness.  No middle ear effusion. No foreign body. Tympanic membrane is not erythematous or bulging.     Nose: Nose normal. No rhinorrhea.     Right Sinus: No maxillary sinus tenderness or frontal sinus tenderness.     Left Sinus: No maxillary sinus tenderness or frontal sinus tenderness.     Mouth/Throat:     Pharynx: Uvula midline. No oropharyngeal exudate or posterior oropharyngeal erythema.     Tonsils: No tonsillar abscesses.  Eyes:     Conjunctiva/sclera: Conjunctivae normal.  Cardiovascular:     Rate and Rhythm: Regular rhythm.     Pulses: Normal pulses.     Heart sounds: Normal heart sounds.  Pulmonary:     Effort: Pulmonary effort is normal.     Breath sounds: Normal breath sounds. No wheezing, rhonchi or rales.  Lymphadenopathy:     Head:     Right side of head: No submental, submandibular, tonsillar, preauricular, posterior auricular or occipital adenopathy.     Left side of head: No submental, submandibular, tonsillar, preauricular, posterior auricular or occipital adenopathy.     Cervical: No cervical adenopathy.  Skin:    General: Skin is warm and dry.   Neurological:     Mental Status: She is alert.  Psychiatric:        Speech: Speech normal.        Behavior: Behavior normal.        Thought Content: Thought content normal.        Assessment & Plan:   Problem List Items Addressed This Visit       Respiratory   Sinusitis - Primary    Afebrile. Non toxic in appearance. No improvement with zpak. PCN allergy and SOB reported from PCN, doxycycline would be a choice; reported diarrhea in 2014 however discussed that may or not recur. We agreed trial of prednisone for residual  inflammation is reasonable.  Patient politely declines CT sinus ahead of prednisone.  She will consider CT sinus if symptoms do not resolve on prednisone.  Counseled her on side effects of prednisone and how to take medication.  Advised consideration for referral to allergist for formal reevaluation antibiotic allergies.  She politely declines today      Relevant Medications   predniSONE (DELTASONE) 10 MG tablet      I am having Elira M. Zenner start on predniSONE. I am also having her maintain her Probiotic Product (PHILLIPS COLON HEALTH PO), Multiple Vitamins-Minerals (WOMENS MULTIVITAMIN PO), scopolamine, and pantoprazole.   Meds ordered this encounter  Medications   predniSONE (DELTASONE) 10 MG tablet    Sig: Take 40 mg by mouth on day 1, then taper 10 mg daily until gone    Dispense:  10 tablet    Refill:  0    Order Specific Question:   Supervising Provider    Answer:   Sherlene Shams [[id number]]    Return precautions given.   Risks, benefits, and alternatives of the medications and treatment plan prescribed today were discussed, and patient expressed understanding.   Education regarding symptom management and diagnosis given to patient on AVS.  Continue to follow with Dale Tenafly, MD for routine health maintenance.   Katie [name] and I agreed with plan.   Rennie Plowman, FNP

## 2022-03-28 ENCOUNTER — Ambulatory Visit
Admission: RE | Admit: 2022-03-28 | Discharge: 2022-03-28 | Disposition: A | Discharge status: Home / Self Care | Payer: BC Managed Care – PPO | Source: Ambulatory Visit | Attending: Gastroenterology | Admitting: Gastroenterology

## 2022-03-28 DIAGNOSIS — K760 Fatty (change of) liver, not elsewhere classified: Secondary | ICD-10-CM | POA: Insufficient documentation

## 2022-03-28 DIAGNOSIS — R161 Splenomegaly, not elsewhere classified: Secondary | ICD-10-CM | POA: Diagnosis not present

## 2022-03-29 ENCOUNTER — Other Ambulatory Visit: Payer: Self-pay | Admitting: Gastroenterology

## 2022-03-29 DIAGNOSIS — R748 Abnormal levels of other serum enzymes: Secondary | ICD-10-CM

## 2022-03-29 DIAGNOSIS — K760 Fatty (change of) liver, not elsewhere classified: Secondary | ICD-10-CM

## 2022-04-05 ENCOUNTER — Ambulatory Visit
Admission: RE | Admit: 2022-04-05 | Discharge: 2022-04-05 | Disposition: A | Discharge status: Home / Self Care | Payer: BC Managed Care – PPO | Source: Ambulatory Visit | Attending: Gastroenterology | Admitting: Gastroenterology

## 2022-04-05 DIAGNOSIS — R748 Abnormal levels of other serum enzymes: Secondary | ICD-10-CM | POA: Insufficient documentation

## 2022-04-05 DIAGNOSIS — K760 Fatty (change of) liver, not elsewhere classified: Secondary | ICD-10-CM | POA: Diagnosis not present

## 2022-04-19 ENCOUNTER — Encounter: Payer: Self-pay | Admitting: Internal Medicine

## 2022-04-19 ENCOUNTER — Ambulatory Visit (INDEPENDENT_AMBULATORY_CARE_PROVIDER_SITE_OTHER): Payer: BC Managed Care – PPO | Admitting: Internal Medicine

## 2022-04-19 VITALS — BP 132/74 | HR 91 | Temp 98.1°F | Ht 67.0 in | Wt 242.2 lb

## 2022-04-19 DIAGNOSIS — R7989 Other specified abnormal findings of blood chemistry: Secondary | ICD-10-CM

## 2022-04-19 DIAGNOSIS — Z862 Personal history of diseases of the blood and blood-forming organs and certain disorders involving the immune mechanism: Secondary | ICD-10-CM

## 2022-04-19 DIAGNOSIS — Z23 Encounter for immunization: Secondary | ICD-10-CM

## 2022-04-19 DIAGNOSIS — Z0000 Encounter for general adult medical examination without abnormal findings: Secondary | ICD-10-CM

## 2022-04-19 DIAGNOSIS — Z1231 Encounter for screening mammogram for malignant neoplasm of breast: Secondary | ICD-10-CM

## 2022-04-19 DIAGNOSIS — Z86010 Personal history of colonic polyps: Secondary | ICD-10-CM

## 2022-04-19 DIAGNOSIS — K219 Gastro-esophageal reflux disease without esophagitis: Secondary | ICD-10-CM

## 2022-04-19 DIAGNOSIS — E7800 Pure hypercholesterolemia, unspecified: Secondary | ICD-10-CM | POA: Diagnosis not present

## 2022-04-19 DIAGNOSIS — F439 Reaction to severe stress, unspecified: Secondary | ICD-10-CM

## 2022-04-19 NOTE — Assessment & Plan Note (Signed)
Physical today 04/19/22.  PAP 04/13/21- negative with negative HPV.  Mammogram 05/31/21 - Birads I. Colonoscopy 10/2019 - f/u in 5 years.

## 2022-04-19 NOTE — Progress Notes (Unsigned)
Patient ID: Katie [name], female   DOB: [birth year], 56 y.o.   MRN: [id number]   Subjective:    Patient ID: Katie [name], female    DOB: [birth year], 56 y.o.   MRN: [id number]   Patient here for  Chief Complaint  Patient presents with   Follow-up    physical   .   HPI Recently saw GI - NAFLD.  Also f/u GERD.  Discussed recent ultrasound.  Diet and exercise.  No chest pain or sob reported.  Some drainage, but no significant sinus pressure. No chest congestion or cough.  No significant abdominal pain.  Using nasacort and saline nasal spray.  Feels allergy symptoms controlled.  Taking metamucil gummies.  Feels helping bowels.  Taking in evening.  Tolerating. Saw [facility] - left shoulder pain.     Past Medical History:  Diagnosis Date   Anemia    Endometriosis    Erosive gastritis    GERD (gastroesophageal reflux disease)    History of kidney stones    Hyperplastic colon polyp    Irritable bowel syndrome    Kidney stones    Migraines    Nonalcoholic fatty liver disease    Ovarian cyst    Positional vertigo    PT helped   Past Surgical History:  Procedure Laterality Date   ABDOMINAL HYSTERECTOMY  2000   left oophorectomy, Dr Mia Creek   APPENDECTOMY  2000   CESAREAN SECTION     COLONOSCOPY     ESOPHAGOGASTRODUODENOSCOPY     EUS N/A 12/04/2017   Procedure: ESOPHAGEAL ENDOSCOPIC ULTRASOUND (EUS) RADIAL;  Surgeon: Bearl Mulberry, MD;  Location: [facility] END[facility]COPY;  Service: Gastroenterology;  Laterality: N/A;   EXPLORATORY LAPAROTOMY     IMAGE GUIDED SINUS SURGERY Bilateral 08/29/2016   Procedure: IMAGE GUIDED SINUS SURGERY;  Surgeon: Vernie Murders, MD;  Location: [facility];  Service: ENT;  Laterality: Bilateral;  NEED DISK GAVE DISK TO CECE 1-23 KP   LITHOTRIPSY     RIGHT OOPHORECTOMY  2002   SEPTOPLASTY Bilateral 08/29/2016   Procedure: SEPTOPLASTY;  Surgeon: Vernie Murders, MD;  Location: [facility];  Service: ENT;  Laterality: Bilateral;   SPHENOIDECTOMY  Right 08/29/2016   Procedure: SPHENOIDECTOMY  with removal content right;  Surgeon: Vernie Murders, MD;  Location: [facility];  Service: ENT;  Laterality: Right;   VAGINAL BIRTH AFTER CESAREAN SECTION     Family History  Problem Relation Age of Onset   Hypertension Mother    Diabetes Mother    Thyroid cancer Mother    Colon polyps Mother    Thyroid cancer Maternal Aunt    Colon polyps Maternal Aunt    Kidney cancer Maternal Uncle    Breast cancer Cousin        maternal   Social History   Socioeconomic History   Marital status: Married    Spouse name: Not on file   Number of children: 2   Years of education: Not on file   Highest education level: Not on file  Occupational History   Not on file  Tobacco Use   Smoking status: Never   Smokeless tobacco: Never  Vaping Use   Vaping Use: Never used  Substance and Sexual Activity   Alcohol use: No    Alcohol/week: 0.0 standard drinks of alcohol   Drug use: No   Sexual activity: Not on file    Comment: Married   Other Topics Concern   Not on file  Social History Narrative  Not on file   Social Determinants of Health   Financial Resource Strain: Not on file  Food Insecurity: Not on file  Transportation Needs: Not on file  Physical Activity: Not on file  Stress: Not on file  Social Connections: Not on file     Review of Systems  Constitutional:  Negative for fatigue and unexpected weight change.  HENT:  Negative for congestion, sinus pressure and sore throat.   Eyes:  Negative for pain and visual disturbance.  Respiratory:  Negative for cough, chest tightness and shortness of breath.   Cardiovascular:  Negative for chest pain and palpitations.  Gastrointestinal:  Negative for abdominal pain, diarrhea and vomiting.  Genitourinary:  Negative for difficulty urinating and dysuria.  Musculoskeletal:  Negative for joint swelling and myalgias.  Skin:  Negative for color change and rash.  Neurological:  Negative for  dizziness, light-headedness and headaches.  Hematological:  Negative for adenopathy. Does not bruise/bleed easily.  Psychiatric/Behavioral:  Negative for agitation and dysphoric mood.        Objective:     BP 132/74 (BP Location: Left Arm, Patient Position: Sitting, Cuff Size: Normal)   Pulse 91   Temp 98.1 F (36.7 C) (Oral)   Ht 5\' 7"  (1.702 m)   Wt 242 lb 3.2 oz (109.9 kg)   LMP 08/27/1998   SpO2 97%   BMI 37.93 kg/m  Wt Readings from Last 3 Encounters:  04/19/22 242 lb 3.2 oz (109.9 kg)  03/27/22 242 lb 12.8 oz (110.1 kg)  01/11/22 244 lb 9.6 oz (110.9 kg)    Physical Exam Vitals reviewed.  Constitutional:      General: She is not in acute distress.    Appearance: Normal appearance. She is well-developed.  HENT:     Head: Normocephalic and atraumatic.     Right Ear: External ear normal.     Left Ear: External ear normal.  Eyes:     General: No scleral icterus.       Right [name]: No discharge.        Left [name]: No discharge.     Conjunctiva/sclera: Conjunctivae normal.  Neck:     Thyroid: No thyromegaly.  Cardiovascular:     Rate and Rhythm: Normal rate and regular rhythm.  Pulmonary:     Effort: No tachypnea, accessory muscle usage or respiratory distress.     Breath sounds: Normal breath sounds. No decreased breath sounds or wheezing.  Chest:  Breasts:    Right: No inverted nipple, mass, nipple discharge or tenderness (no axillary adenopathy).     Left: No inverted nipple, mass, nipple discharge or tenderness (no axilarry adenopathy).  Abdominal:     General: Bowel sounds are normal.     Palpations: Abdomen is soft.     Tenderness: There is no abdominal tenderness.  Musculoskeletal:        General: No swelling or tenderness.     Cervical back: Neck supple.  Lymphadenopathy:     Cervical: No cervical adenopathy.  Skin:    Findings: No erythema or rash.  Neurological:     Mental Status: She is alert and oriented to person, place, and time.  Psychiatric:         Mood and Affect: Mood normal.        Behavior: Behavior normal.      Outpatient Encounter Medications as of 04/19/2022  Medication Sig   Multiple Vitamins-Minerals (WOMENS MULTIVITAMIN PO) Take by mouth.   pantoprazole (PROTONIX) 40 MG tablet Take 1 tablet (  40 mg total) by mouth 2 (two) times daily before a meal.   predniSONE (DELTASONE) 10 MG tablet Take 40 mg by mouth on day 1, then taper 10 mg daily until gone   Probiotic Product ([facility] PO) Take by mouth.   [DISCONTINUED] scopolamine (TRANSDERM-SCOP) 1 MG/3DAYS Place 1 patch (1.5 mg total) onto the skin every 3 (three) days. (Patient not taking: Reported on 03/27/2022)   No facility-administered encounter medications on file as of 04/19/2022.     Lab Results  Component Value Date   WBC 5.9 10/12/2021   HGB 12.9 10/12/2021   HCT 39.4 10/12/2021   PLT 179 10/12/2021   GLUCOSE 107 (H) 04/19/2022   CHOL 194 04/19/2022   TRIG 121 04/19/2022   HDL 50 04/19/2022   LDLCALC 122 (H) 04/19/2022   ALT 55 (H) 04/19/2022   AST 48 (H) 04/19/2022   NA 140 04/19/2022   K 4.5 04/19/2022   CL 105 04/19/2022   CREATININE 0.73 04/19/2022   BUN 14 04/19/2022   CO2 21 04/19/2022   TSH 1.850 10/12/2021   HGBA1C 4.9 09/29/2018    [place] ELASTOGRAPHY LIVER  Result Date: 04/05/2022 CLINICAL DATA:  Fatty liver, elevated LFTs EXAM: [place] ELASTOGRAPHY HEPATIC TECHNIQUE: Sonography of the liver was performed. In addition, ultrasound elastography evaluation of the liver was performed. A region of interest was placed within the right lobe of the liver. Following application of a compressive sonographic pulse, tissue compressibility was assessed. Multiple assessments were performed at the selected site. Median tissue compressibility was determined. Previously, hepatic stiffness was assessed by shear wave velocity. Based on recently published Society of Radiologists in Ultrasound consensus article, reporting is now recommended to be performed  in the SI units of pressure (kiloPascals) representing hepatic stiffness/elasticity. The obtained result is compared to the published reference standards. (cACLD = compensated Advanced Chronic Liver Disease) COMPARISON:  03/28/2022 FINDINGS: Liver: Echogenic parenchyma, likely fatty infiltration though this can be seen with cirrhosis and certain infiltrative disorders. No focal hepatic mass or nodularity. Portal vein is patent on color Doppler imaging with normal direction of blood flow towards the liver. ULTRASOUND HEPATIC ELASTOGRAPHY Device: Siemens Helix VTQ Patient position: Supine Transducer: DAX Number of measurements: 11 Hepatic segment:  8 Median kPa: 2.3 IQR: 1.0 IQR/Median kPa ratio: 0.43 Data quality:  Good Diagnostic category:  < or = 5 kPa: high probability of being normal The use of hepatic elastography is applicable to patients with viral hepatitis and non-alcoholic fatty liver disease. At this time, there is insufficient data for the referenced cut-off values and use in other causes of liver disease, including alcoholic liver disease. Patients, however, may be assessed by elastography and serve as their own reference standard/baseline. In patients with non-alcoholic liver disease, the values suggesting compensated advanced chronic liver disease (cACLD) may be lower, and patients may need additional testing with elasticity results of 7-9 kPa. Please note that abnormal hepatic elasticity and shear wave velocities may also be identified in clinical settings other than with hepatic fibrosis, such as: acute hepatitis, elevated right heart and central venous pressures including use of beta blockers, veno-occlusive disease (Budd-Chiari), infiltrative processes such as mastocytosis/amyloidosis/infiltrative tumor/lymphoma, extrahepatic cholestasis, with hyperemia in the post-prandial state, and with liver transplantation. Correlation with patient history, laboratory data, and clinical condition recommended.  Diagnostic Categories: < or =5 kPa: high probability of being normal < or =9 kPa: in the absence of other known clinical signs, rules out cACLD >9 kPa and ?13 kPa: suggestive of cACLD,  but needs further testing >13 kPa: highly suggestive of cACLD > or =17 kPa: highly suggestive of cACLD with an increased probability of clinically significant portal hypertension IMPRESSION: ULTRASOUND LIVER: Probable fatty infiltration of liver as above. ULTRASOUND HEPATIC ELASTOGRAPHY: Median kPa:  2.3 Diagnostic category:  < or = 5 kPa: high probability of being normal Electronically Signed   By: Lavonia Dana M.D.   On: 04/05/2022 11:29       Assessment & Plan:   Problem List Items Addressed This Visit     Abnormal liver function tests - Primary    Diagnosed with NAFLD.  Has been evaluated by GI.  Continue diet and exercise.  Follow liver function tests.  Discussed with GI - [place] elastography liver.  They will contact pt regarding follow up.  No further w/up at this time.       Relevant Orders   Hepatic function panel (Completed)   GERD (gastroesophageal reflux disease)    Is s/p EGD 10/2019.  On protonix.        Health care maintenance    Physical today 04/19/22.  PAP 04/13/21- negative with negative HPV.  Mammogram 05/31/21 - Birads I. Colonoscopy 10/2019 - f/u in 5 years.        History of anemia    Follow cbc.       History of colonic polyps    Colonoscopy 10/2019.  Recommended f/u colonoscopy in 5 years.       Hypercholesteremia    Low cholesterol diet and exercise.  Follow lipid panel.       Relevant Orders   Basic metabolic panel (Completed)   Lipid panel (Completed)   Stress    Overall reports handling things relatively well.  Follow.        Other Visit Diagnoses     Encounter for screening mammogram for malignant neoplasm of breast       Relevant Orders   MM 3D SCREEN BREAST BILATERAL   Need for immunization against influenza       Relevant Orders   Flu Vaccine QUAD 30mo+IM (Fluarix,  Fluzone & Alfiuria Quad PF) (Completed)        Einar Pheasant, MD

## 2022-04-20 LAB — HEPATIC FUNCTION PANEL
ALT: 55 IU/L — ABNORMAL HIGH (ref 0–32)
AST: 48 IU/L — ABNORMAL HIGH (ref 0–40)
Albumin: 4.2 g/dL (ref 3.8–4.9)
Alkaline Phosphatase: 127 IU/L — ABNORMAL HIGH (ref 44–121)
Bilirubin Total: 0.7 mg/dL (ref 0.0–1.2)
Bilirubin, Direct: 0.21 mg/dL (ref 0.00–0.40)
Total Protein: 6.7 g/dL (ref 6.0–8.5)

## 2022-04-20 LAB — BASIC METABOLIC PANEL
BUN/Creatinine Ratio: 19 (ref 9–23)
BUN: 14 mg/dL (ref 6–24)
CO2: 21 mmol/L (ref 20–29)
Calcium: 9.2 mg/dL (ref 8.7–10.2)
Chloride: 105 mmol/L (ref 96–106)
Creatinine, Ser: 0.73 mg/dL (ref 0.57–1.00)
Glucose: 107 mg/dL — ABNORMAL HIGH (ref 70–99)
Potassium: 4.5 mmol/L (ref 3.5–5.2)
Sodium: 140 mmol/L (ref 134–144)
eGFR: 98 mL/min/{1.73_m2} (ref >59)

## 2022-04-20 LAB — LIPID PANEL
Chol/HDL Ratio: 3.9 ratio (ref 0.0–4.4)
Cholesterol, Total: 194 mg/dL (ref 100–199)
HDL: 50 mg/dL (ref >39)
LDL Chol Calc (NIH): 122 mg/dL — ABNORMAL HIGH (ref 0–99)
Triglycerides: 121 mg/dL (ref 0–149)
VLDL Cholesterol Cal: 22 mg/dL (ref 5–40)

## 2022-04-22 NOTE — Assessment & Plan Note (Signed)
Diagnosed with NAFLD.  Has been evaluated by GI.  Continue diet and exercise.  Follow liver function tests.  Discussed with GI - [place] elastography liver.  They will contact pt regarding follow up.  No further w/up at this time.

## 2022-04-22 NOTE — Assessment & Plan Note (Signed)
Is s/p EGD 10/2019.  On protonix.   

## 2022-04-22 NOTE — Assessment & Plan Note (Signed)
Colonoscopy 10/2019.  Recommended f/u colonoscopy in 5 years.  

## 2022-04-22 NOTE — Assessment & Plan Note (Signed)
Low cholesterol diet and exercise.  Follow lipid panel.   

## 2022-04-22 NOTE — Assessment & Plan Note (Signed)
Follow cbc.  

## 2022-04-22 NOTE — Assessment & Plan Note (Signed)
Overall reports handling things relatively well.  Follow.   

## 2022-04-23 ENCOUNTER — Other Ambulatory Visit: Payer: Self-pay

## 2022-04-23 DIAGNOSIS — R7989 Other specified abnormal findings of blood chemistry: Secondary | ICD-10-CM

## 2022-05-13 ENCOUNTER — Other Ambulatory Visit (INDEPENDENT_AMBULATORY_CARE_PROVIDER_SITE_OTHER): Payer: BC Managed Care – PPO

## 2022-05-13 DIAGNOSIS — R7989 Other specified abnormal findings of blood chemistry: Secondary | ICD-10-CM

## 2022-05-15 LAB — HEPATIC FUNCTION PANEL
ALT: 60 IU/L — ABNORMAL HIGH (ref 0–32)
AST: 53 IU/L — ABNORMAL HIGH (ref 0–40)
Albumin: 4.5 g/dL (ref 3.8–4.9)
Alkaline Phosphatase: 141 IU/L — ABNORMAL HIGH (ref 44–121)
Bilirubin Total: 0.5 mg/dL (ref 0.0–1.2)
Bilirubin, Direct: 0.17 mg/dL (ref 0.00–0.40)
Total Protein: 6.8 g/dL (ref 6.0–8.5)

## 2022-05-19 ENCOUNTER — Encounter: Payer: Self-pay | Admitting: *Deleted

## 2022-06-03 ENCOUNTER — Ambulatory Visit
Admission: RE | Admit: 2022-06-03 | Discharge: 2022-06-03 | Disposition: A | Discharge status: Home / Self Care | Payer: BC Managed Care – PPO | Source: Ambulatory Visit | Attending: Internal Medicine | Admitting: Internal Medicine

## 2022-06-03 DIAGNOSIS — Z1231 Encounter for screening mammogram for malignant neoplasm of breast: Secondary | ICD-10-CM | POA: Diagnosis not present

## 2022-08-20 ENCOUNTER — Ambulatory Visit: Payer: BC Managed Care – PPO | Admitting: Internal Medicine

## 2022-08-20 ENCOUNTER — Encounter: Payer: Self-pay | Admitting: Internal Medicine

## 2022-08-20 VITALS — BP 126/78 | HR 66 | Temp 98.0°F | Resp 16 | Ht 66.0 in | Wt 238.0 lb

## 2022-08-20 DIAGNOSIS — Z862 Personal history of diseases of the blood and blood-forming organs and certain disorders involving the immune mechanism: Secondary | ICD-10-CM

## 2022-08-20 DIAGNOSIS — E7800 Pure hypercholesterolemia, unspecified: Secondary | ICD-10-CM

## 2022-08-20 DIAGNOSIS — N809 Endometriosis, unspecified: Secondary | ICD-10-CM | POA: Diagnosis not present

## 2022-08-20 DIAGNOSIS — F439 Reaction to severe stress, unspecified: Secondary | ICD-10-CM

## 2022-08-20 DIAGNOSIS — K219 Gastro-esophageal reflux disease without esophagitis: Secondary | ICD-10-CM

## 2022-08-20 DIAGNOSIS — Z83719 Family history of colon polyps, unspecified: Secondary | ICD-10-CM

## 2022-08-20 DIAGNOSIS — R109 Unspecified abdominal pain: Secondary | ICD-10-CM

## 2022-08-20 DIAGNOSIS — R7989 Other specified abnormal findings of blood chemistry: Secondary | ICD-10-CM

## 2022-08-20 DIAGNOSIS — Z86010 Personal history of colonic polyps: Secondary | ICD-10-CM

## 2022-08-20 NOTE — Patient Instructions (Signed)
Benefiber - daily 

## 2022-08-20 NOTE — Progress Notes (Signed)
Subjective:    Patient ID: Katie [name], female    DOB: 1966/02/21, 57 y.o.   MRN: [id number]  Patient here for  Chief Complaint  Patient presents with   Medical Management of Chronic Issues    HPI Here to follow up regarding GERD, NAFLD and hypercholesterolemia.  Reports she is doing relatively well.  No chest pain or sob reported.  No increased cough or congestion. Does report occasional nausea.  Some left side pain at times - if sits for a long period of time.  Not constant.  No bowel change reported.     Past Medical History:  Diagnosis Date   Anemia    Endometriosis    Erosive gastritis    GERD (gastroesophageal reflux disease)    History of kidney stones    Hyperplastic colon polyp    Irritable bowel syndrome    Kidney stones    Migraines    Nonalcoholic fatty liver disease    Ovarian cyst    Positional vertigo    PT helped   Past Surgical History:  Procedure Laterality Date   ABDOMINAL HYSTERECTOMY  2000   left oophorectomy, Dr Burke Keels   APPENDECTOMY  2000   CESAREAN SECTION     COLONOSCOPY     ESOPHAGOGASTRODUODENOSCOPY     EUS N/A 12/04/2017   Procedure: ESOPHAGEAL ENDOSCOPIC ULTRASOUND (EUS) RADIAL;  Surgeon: Holly Bodily, MD;  Location: [facility] ENDOSCOPY;  Service: Gastroenterology;  Laterality: N/A;   EXPLORATORY LAPAROTOMY     IMAGE GUIDED SINUS SURGERY Bilateral 08/29/2016   Procedure: IMAGE GUIDED SINUS SURGERY;  Surgeon: Margaretha Sheffield, MD;  Location: [place];  Service: ENT;  Laterality: Bilateral;  NEED DISK GAVE DISK TO CECE 1-23 KP   LITHOTRIPSY     RIGHT OOPHORECTOMY  2002   SEPTOPLASTY Bilateral 08/29/2016   Procedure: SEPTOPLASTY;  Surgeon: Margaretha Sheffield, MD;  Location: [place];  Service: ENT;  Laterality: Bilateral;   SPHENOIDECTOMY Right 08/29/2016   Procedure: SPHENOIDECTOMY  with removal content right;  Surgeon: Margaretha Sheffield, MD;  Location: [place];  Service: ENT;  Laterality: Right;   VAGINAL BIRTH AFTER  CESAREAN SECTION     Family History  Problem Relation Age of Onset   Hypertension Mother    Diabetes Mother    Thyroid cancer Mother    Colon polyps Mother    Thyroid cancer Maternal Aunt    Colon polyps Maternal Aunt    Kidney cancer Maternal Uncle    Breast cancer Cousin        maternal   Social History   Socioeconomic History   Marital status: Married    Spouse name: Not on file   Number of children: 2   Years of education: Not on file   Highest education level: Not on file  Occupational History   Not on file  Tobacco Use   Smoking status: Never   Smokeless tobacco: Never  Vaping Use   Vaping Use: Never used  Substance and Sexual Activity   Alcohol use: No    Alcohol/week: 0.0 standard drinks of alcohol   Drug use: No   Sexual activity: Not on file    Comment: Married   Other Topics Concern   Not on file  Social History Narrative   Not on file   Social Determinants of Health   Financial Resource Strain: Not on file  Food Insecurity: Not on file  Transportation Needs: Not on file  Physical Activity: Not on file  Stress: Not on file  Social Connections: Not on file     Review of Systems  Constitutional:  Negative for appetite change and unexpected weight change.  HENT:  Negative for congestion and sinus pressure.   Respiratory:  Negative for cough, chest tightness and shortness of breath.   Cardiovascular:  Negative for chest pain, palpitations and leg swelling.  Gastrointestinal:  Negative for abdominal pain, diarrhea and vomiting.  Genitourinary:  Negative for difficulty urinating and dysuria.  Musculoskeletal:  Negative for joint swelling and myalgias.  Skin:  Negative for color change and rash.  Neurological:  Negative for dizziness and headaches.  Psychiatric/Behavioral:  Negative for agitation and dysphoric mood.        Objective:     BP 126/78   Pulse 66   Temp 98 F (36.7 C)   Resp 16   Ht 5\' 6"  (1.676 m)   Wt 238 lb (108 kg)   LMP  08/27/1998   SpO2 98%   BMI 38.41 kg/m  Wt Readings from Last 3 Encounters:  08/20/22 238 lb (108 kg)  04/19/22 242 lb 3.2 oz (109.9 kg)  03/27/22 242 lb 12.8 oz (110.1 kg)    Physical Exam Vitals reviewed.  Constitutional:      General: She is not in acute distress.    Appearance: Normal appearance.  HENT:     Head: Normocephalic and atraumatic.     Right Ear: External ear normal.     Left Ear: External ear normal.  Eyes:     General: No scleral icterus.       Right eye: No discharge.        Left eye: No discharge.     Conjunctiva/sclera: Conjunctivae normal.  Neck:     Thyroid: No thyromegaly.  Cardiovascular:     Rate and Rhythm: Normal rate and regular rhythm.  Pulmonary:     Effort: No respiratory distress.     Breath sounds: Normal breath sounds. No wheezing.  Abdominal:     General: Bowel sounds are normal.     Palpations: Abdomen is soft.     Tenderness: There is no abdominal tenderness.  Musculoskeletal:        General: No swelling or tenderness.     Cervical back: Neck supple. No tenderness.  Lymphadenopathy:     Cervical: No cervical adenopathy.  Skin:    Findings: No erythema or rash.  Neurological:     Mental Status: She is alert.  Psychiatric:        Mood and Affect: Mood normal.        Behavior: Behavior normal.      Outpatient Encounter Medications as of 08/20/2022  Medication Sig   Multiple Vitamins-Minerals (WOMENS MULTIVITAMIN PO) Take by mouth.   pantoprazole (PROTONIX) 40 MG tablet Take 1 tablet (40 mg total) by mouth 2 (two) times daily before a meal.   Probiotic Product ([place]) Take by mouth.   [DISCONTINUED] predniSONE (DELTASONE) 10 MG tablet Take 40 mg by mouth on day 1, then taper 10 mg daily until gone   No facility-administered encounter medications on file as of 08/20/2022.     Lab Results  Component Value Date   WBC 6.2 08/20/2022   HGB 13.1 08/20/2022   HCT 39.9 08/20/2022   PLT 200 08/20/2022    GLUCOSE 99 08/20/2022   CHOL 195 08/20/2022   TRIG 82 08/20/2022   HDL 48 08/20/2022   LDLCALC 132 (H) 08/20/2022   ALT 57 (H)  08/20/2022   AST 49 (H) 08/20/2022   NA 142 08/20/2022   K 4.5 08/20/2022   CL 105 08/20/2022   CREATININE 0.62 08/20/2022   BUN 10 08/20/2022   CO2 23 08/20/2022   TSH 1.810 08/20/2022   HGBA1C 4.9 09/29/2018    MM 3D SCREEN BREAST BILATERAL  Result Date: 06/04/2022 CLINICAL DATA:  Screening. EXAM: DIGITAL SCREENING BILATERAL MAMMOGRAM WITH TOMOSYNTHESIS AND CAD TECHNIQUE: Bilateral screening digital craniocaudal and mediolateral oblique mammograms were obtained. Bilateral screening digital breast tomosynthesis was performed. The images were evaluated with computer-aided detection. COMPARISON:  Previous exam(s). ACR Breast Density Category b: There are scattered areas of fibroglandular density. FINDINGS: There are no findings suspicious for malignancy. IMPRESSION: No mammographic evidence of malignancy. A result letter of this screening mammogram will be mailed directly to the patient. RECOMMENDATION: Screening mammogram in one year. (Code:SM-B-01Y) BI-RADS CATEGORY  1: Negative. Electronically Signed   By: Abelardo Diesel M.D.   On: 06/04/2022 13:43       Assessment & Plan:  Hypercholesteremia Assessment & Plan: Low cholesterol diet and exercise.  Follow lipid panel.   Orders: -     Basic metabolic panel -     Hepatic function panel -     Lipid panel -     CBC with Differential/Platelet -     TSH  Abdominal pain, unspecified abdominal location Assessment & Plan: Reported some occasional left side pain.  Worse after sitting for a long time.  Occasional nausea.  Check labs including lipase  and liver panel.  Just had abdominal ultrasound - changes c/w fatty liver and enlarged spleen.  Seeing GI.  Continue protonix.  Continue f/u with GI.  Discussed bowel movements.  Question if notices the discomfort more when bowels have not moved.  Discussed benefiber.   Call with update.   Orders: -     Lipase  Abnormal liver function tests Assessment & Plan: Diagnosed with NAFLD.  Has been evaluated by GI.  Continue diet and exercise.  Follow liver function tests.  Discussed with GI - [place] elastography liver.  Has f/u with GI 10/2022.    Endometriosis Assessment & Plan: S/p hysterectomy.  Previous pap with ASCUS.  Saw Dr Leonides Schanz.  S/p colposcopy.  Recommended pap in 3 years.  PAP 04/16/21 - negative with negative HPV.    Family history of colonic polyps Assessment & Plan: Colonoscopy 10/2019.    Gastroesophageal reflux disease, unspecified whether esophagitis present Assessment & Plan: Is s/p EGD 10/2019.  On protonix.     History of colonic polyps Assessment & Plan: Colonoscopy 10/2019.  Recommended f/u colonoscopy in 5 years.    History of anemia Assessment & Plan: Follow cbc.    Stress Assessment & Plan: Overall reports handling things relatively well.  Follow.        Einar Pheasant, MD

## 2022-08-21 LAB — BASIC METABOLIC PANEL
BUN/Creatinine Ratio: 16 (ref 9–23)
BUN: 10 mg/dL (ref 6–24)
CO2: 23 mmol/L (ref 20–29)
Calcium: 9.3 mg/dL (ref 8.7–10.2)
Chloride: 105 mmol/L (ref 96–106)
Creatinine, Ser: 0.62 mg/dL (ref 0.57–1.00)
Glucose: 99 mg/dL (ref 70–99)
Potassium: 4.5 mmol/L (ref 3.5–5.2)
Sodium: 142 mmol/L (ref 134–144)
eGFR: 106 mL/min/{1.73_m2} (ref >59)

## 2022-08-21 LAB — HEPATIC FUNCTION PANEL
ALT: 57 IU/L — ABNORMAL HIGH (ref 0–32)
AST: 49 IU/L — ABNORMAL HIGH (ref 0–40)
Albumin: 4.3 g/dL (ref 3.8–4.9)
Alkaline Phosphatase: 144 IU/L — ABNORMAL HIGH (ref 44–121)
Bilirubin Total: 0.5 mg/dL (ref 0.0–1.2)
Bilirubin, Direct: 0.16 mg/dL (ref 0.00–0.40)
Total Protein: 6.8 g/dL (ref 6.0–8.5)

## 2022-08-21 LAB — CBC WITH DIFFERENTIAL/PLATELET
Basophils Absolute: 0 10*3/uL (ref 0.0–0.2)
Basos: 1 %
EOS (ABSOLUTE): 0.2 10*3/uL (ref 0.0–0.4)
Eos: 2 %
Hematocrit: 39.9 % (ref 34.0–46.6)
Hemoglobin: 13.1 g/dL (ref 11.1–15.9)
Immature Grans (Abs): 0 10*3/uL (ref 0.0–0.1)
Immature Granulocytes: 0 %
Lymphocytes Absolute: 1.3 10*3/uL (ref 0.7–3.1)
Lymphs: 21 %
MCH: 28.5 pg (ref 26.6–33.0)
MCHC: 32.8 g/dL (ref 31.5–35.7)
MCV: 87 fL (ref 79–97)
Monocytes Absolute: 0.3 10*3/uL (ref 0.1–0.9)
Monocytes: 5 %
Neutrophils Absolute: 4.4 10*3/uL (ref 1.4–7.0)
Neutrophils: 71 %
Platelets: 200 10*3/uL (ref 150–450)
RBC: 4.6 x10E6/uL (ref 3.77–5.28)
RDW: 13.7 % (ref 11.7–15.4)
WBC: 6.2 10*3/uL (ref 3.4–10.8)

## 2022-08-21 LAB — LIPID PANEL
Chol/HDL Ratio: 4.1 ratio (ref 0.0–4.4)
Cholesterol, Total: 195 mg/dL (ref 100–199)
HDL: 48 mg/dL (ref >39)
LDL Chol Calc (NIH): 132 mg/dL — ABNORMAL HIGH (ref 0–99)
Triglycerides: 82 mg/dL (ref 0–149)
VLDL Cholesterol Cal: 15 mg/dL (ref 5–40)

## 2022-08-21 LAB — LIPASE: Lipase: 19 U/L (ref 14–72)

## 2022-08-21 LAB — TSH: TSH: 1.81 u[IU]/mL (ref 0.450–4.500)

## 2022-08-22 ENCOUNTER — Telehealth: Payer: Self-pay

## 2022-08-22 NOTE — Telephone Encounter (Signed)
  LM FOR PT TO CB Re:  Einar Pheasant, MD  [facility] Please call and notify - liver function tests stable.. still elevated, but stable.  Has seen GI for w/up.  Confirm has f/u scheduled.  Cholesterol levels relatively stable.  Continue low cholesterol diet and exercise.  We will follow.  Pancreas test wnl.  Hgb, thyroid test and kidney function tests are wnl.

## 2022-08-22 NOTE — Telephone Encounter (Signed)
Patient returned office phone call, note read from Dr Nicki Reaper. Patient stated she has had a follow up with GI.

## 2022-08-26 ENCOUNTER — Encounter: Payer: Self-pay | Admitting: Internal Medicine

## 2022-08-26 NOTE — Assessment & Plan Note (Signed)
Follow cbc.  

## 2022-08-26 NOTE — Assessment & Plan Note (Signed)
Low cholesterol diet and exercise.  Follow lipid panel.   

## 2022-08-26 NOTE — Assessment & Plan Note (Signed)
Is s/p EGD 10/2019.  On protonix.

## 2022-08-26 NOTE — Assessment & Plan Note (Signed)
Colonoscopy 10/2019.  Recommended f/u colonoscopy in 5 years.

## 2022-08-26 NOTE — Assessment & Plan Note (Signed)
Colonoscopy 10/2019.

## 2022-08-26 NOTE — Assessment & Plan Note (Signed)
Diagnosed with NAFLD.  Has been evaluated by GI.  Continue diet and exercise.  Follow liver function tests.  Discussed with GI - [place] elastography liver.  Has f/u with GI 10/2022.

## 2022-08-26 NOTE — Assessment & Plan Note (Signed)
S/p hysterectomy.  Previous pap with ASCUS.  Saw Dr Leonides Schanz.  S/p colposcopy.  Recommended pap in 3 years.  PAP 04/16/21 - negative with negative HPV.

## 2022-08-26 NOTE — Assessment & Plan Note (Signed)
Overall reports handling things relatively well.  Follow.

## 2022-08-26 NOTE — Assessment & Plan Note (Addendum)
Reported some occasional left side pain.  Worse after sitting for a long time.  Occasional nausea.  Check labs including lipase  and liver panel.  Just had abdominal ultrasound - changes c/w fatty liver and enlarged spleen.  Seeing GI.  Continue protonix.  Continue f/u with GI.  Discussed bowel movements.  Question if notices the discomfort more when bowels have not moved.  Discussed benefiber.  Call with update.

## 2022-09-10 DIAGNOSIS — R051 Acute cough: Secondary | ICD-10-CM | POA: Diagnosis not present

## 2022-09-10 DIAGNOSIS — Z03818 Encounter for observation for suspected exposure to other biological agents ruled out: Secondary | ICD-10-CM | POA: Diagnosis not present

## 2022-09-10 DIAGNOSIS — J019 Acute sinusitis, unspecified: Secondary | ICD-10-CM | POA: Diagnosis not present

## 2022-10-02 ENCOUNTER — Encounter: Payer: Self-pay | Admitting: Internal Medicine

## 2022-10-02 NOTE — Telephone Encounter (Signed)
After messaging the patient she decided to be seen and she is scheduled with [facility] on tomorrow.  Kiev Labrosse,cma

## 2022-10-03 ENCOUNTER — Ambulatory Visit: Payer: BC Managed Care – PPO | Admitting: Nurse Practitioner

## 2022-10-03 ENCOUNTER — Encounter: Payer: Self-pay | Admitting: Nurse Practitioner

## 2022-10-03 VITALS — BP 118/82 | HR 93 | Temp 97.7°F | Ht 66.0 in | Wt 240.2 lb

## 2022-10-03 DIAGNOSIS — J0140 Acute pansinusitis, unspecified: Secondary | ICD-10-CM | POA: Diagnosis not present

## 2022-10-03 MED ORDER — PREDNISONE 10 MG PO TABS: ORAL_TABLET | ORAL | 0 refills | Status: DC

## 2022-10-03 NOTE — Patient Instructions (Addendum)
Rx prednisone tapering sent to pharmacy. Advice to take OTC Antihistamine for post nasal drip. Can also use OTC  ear drop for ear pain, No infection noted during the exam. Increase hydration, Use warm fluids.  Steam/ humidifier.

## 2022-10-03 NOTE — Progress Notes (Signed)
Established Patient Office Visit  Subjective:  Patient ID: Katie [name], female    DOB: 1966-07-10  Age: 57 y.o. MRN: [id number]  CC:  Chief Complaint  Patient presents with   Sinusitis    Sinus Infection 3 weeks ago took antibiotic Used Netipot trying to clean sinuses out & her ears popped now she has off & on bilateral ear pain    HPI  SUBJECTIVE:  Katie [name] is a 57 y.o. female who complains of congestion, sinus pressure, PND , cough and ear pain for  weeks.  She was seen at [facility] clinic urgent care on 09/10/2022 and treated with Z-Pak and promethazine cough syrup.  Patient states that she still have sinus pressure.  She also complains of ear pain after using Netipot.  Patient denies smoke cigarettes.   HPI   Past Medical History:  Diagnosis Date   Anemia    Endometriosis    Erosive gastritis    GERD (gastroesophageal reflux disease)    History of kidney stones    Hyperplastic colon polyp    Irritable bowel syndrome    Kidney stones    Migraines    Nonalcoholic fatty liver disease    Ovarian cyst    Positional vertigo    PT helped    Past Surgical History:  Procedure Laterality Date   ABDOMINAL HYSTERECTOMY  2000   left oophorectomy, Dr Burke Keels   APPENDECTOMY  2000   CESAREAN SECTION     COLONOSCOPY     ESOPHAGOGASTRODUODENOSCOPY     EUS N/A 12/04/2017   Procedure: ESOPHAGEAL ENDOSCOPIC ULTRASOUND (EUS) RADIAL;  Surgeon: Holly Bodily, MD;  Location: [facility] ENDOSCOPY;  Service: Gastroenterology;  Laterality: N/A;   EXPLORATORY LAPAROTOMY     IMAGE GUIDED SINUS SURGERY Bilateral 08/29/2016   Procedure: IMAGE GUIDED SINUS SURGERY;  Surgeon: Margaretha Sheffield, MD;  Location: [place];  Service: ENT;  Laterality: Bilateral;  NEED DISK GAVE DISK TO CECE 1-23 KP   LITHOTRIPSY     RIGHT OOPHORECTOMY  2002   SEPTOPLASTY Bilateral 08/29/2016   Procedure: SEPTOPLASTY;  Surgeon: Margaretha Sheffield, MD;  Location: [place];  Service: ENT;   Laterality: Bilateral;   SPHENOIDECTOMY Right 08/29/2016   Procedure: SPHENOIDECTOMY  with removal content right;  Surgeon: Margaretha Sheffield, MD;  Location: [place];  Service: ENT;  Laterality: Right;   VAGINAL BIRTH AFTER CESAREAN SECTION      Family History  Problem Relation Age of Onset   Hypertension Mother    Diabetes Mother    Thyroid cancer Mother    Colon polyps Mother    Thyroid cancer Maternal Aunt    Colon polyps Maternal Aunt    Kidney cancer Maternal Uncle    Breast cancer Cousin        maternal    Social History   Socioeconomic History   Marital status: Married    Spouse name: Not on file   Number of children: 2   Years of education: Not on file   Highest education level: Not on file  Occupational History   Not on file  Tobacco Use   Smoking status: Never   Smokeless tobacco: Never  Vaping Use   Vaping Use: Never used  Substance and Sexual Activity   Alcohol use: No    Alcohol/week: 0.0 standard drinks of alcohol   Drug use: No   Sexual activity: Not on file    Comment: Married   Other Topics Concern   Not on  file  Social History Narrative   Not on file   Social Determinants of Health   Financial Resource Strain: Not on file  Food Insecurity: Not on file  Transportation Needs: Not on file  Physical Activity: Not on file  Stress: Not on file  Social Connections: Not on file  Intimate Partner Violence: Not on file     Outpatient Medications Prior to Visit  Medication Sig Dispense Refill   Multiple Vitamins-Minerals (WOMENS MULTIVITAMIN PO) Take by mouth.     pantoprazole (PROTONIX) 40 MG tablet Take 1 tablet (40 mg total) by mouth 2 (two) times daily before a meal. 180 tablet 1   Probiotic Product ([place]) Take by mouth.     No facility-administered medications prior to visit.    Allergies  Allergen Reactions   Doxycycline Diarrhea and Nausea And Vomiting        Levaquin [Levofloxacin]     Joint pain    Shellfish Allergy Nausea And Vomiting   Amoxicillin Rash   Codeine Other (See Comments)    Makes her feel disoriented   Contrast Media [Iodinated Contrast Media] Rash   Penicillins Rash    Has patient had a PCN reaction causing immediate rash, facial/tongue/throat swelling, SOB or lightheadedness with hypotension: Yes Has patient had a PCN reaction causing severe rash involving mucus membranes or skin necrosis: No Has patient had a PCN reaction that required hospitalization: No Has patient had a PCN reaction occurring within the last 10 years: Yes If all of the above answers are "NO", then may proceed with Cephalosporin use.  **No issues with CEFTIN**    ROS Review of Systems  Constitutional: Negative.   HENT:  Positive for congestion, postnasal drip and sinus pressure.   Respiratory:  Positive for cough.   Cardiovascular: Negative.   Neurological: Negative.   Psychiatric/Behavioral: Negative.        Objective:    Physical Exam Constitutional:      Appearance: Normal appearance.  HENT:     Head: Normocephalic.     Right Ear: Tympanic membrane normal.     Left Ear: Tympanic membrane normal.     Nose:     Right Sinus: Maxillary sinus tenderness and frontal sinus tenderness present.     Left Sinus: Maxillary sinus tenderness and frontal sinus tenderness present.     Mouth/Throat:     Mouth: Mucous membranes are moist.     Pharynx: Oropharynx is clear.  Eyes:     Pupils: Pupils are equal, round, and reactive to light.  Cardiovascular:     Pulses: Normal pulses.     Heart sounds: Normal heart sounds.  Pulmonary:     Effort: Pulmonary effort is normal.     Breath sounds: Normal breath sounds. No wheezing.  Abdominal:     General: Bowel sounds are normal.     Palpations: Abdomen is soft.  Skin:    General: Skin is warm.  Neurological:     General: No focal deficit present.     Mental Status: She is alert and oriented to person, place, and time. Mental status is at  baseline.  Psychiatric:        Mood and Affect: Mood normal.        Behavior: Behavior normal.        Thought Content: Thought content normal.        Judgment: Judgment normal.     BP 118/82   Pulse 93   Temp 97.7 F (36.5 C)  Ht 5\' 6"  (1.676 m)   Wt 240 lb 3.2 oz (109 kg)   LMP 08/27/1998   SpO2 96%   BMI 38.77 kg/m  Wt Readings from Last 3 Encounters:  10/03/22 240 lb 3.2 oz (109 kg)  08/20/22 238 lb (108 kg)  04/19/22 242 lb 3.2 oz (109.9 kg)     Health Maintenance  Topic Date Due   Zoster Vaccines- Shingrix (1 of 2) 11/19/2022 (Originally 10/14/1986)   INFLUENZA VACCINE  02/20/2023   MAMMOGRAM  06/04/2023   PAP SMEAR-Modifier  04/13/2024   COLONOSCOPY (Pts 45-51yrs Insurance coverage will need to be confirmed)  11/17/2024   Hepatitis C Screening  Completed   HIV Screening  Completed   HPV VACCINES  Aged Out   DTaP/Tdap/Td  Discontinued   COVID-19 Vaccine  Discontinued    There are no preventive care reminders to display for this patient.  Lab Results  Component Value Date   TSH 1.810 08/20/2022   Lab Results  Component Value Date   WBC 6.2 08/20/2022   HGB 13.1 08/20/2022   HCT 39.9 08/20/2022   MCV 87 08/20/2022   PLT 200 08/20/2022   Lab Results  Component Value Date   NA 142 08/20/2022   K 4.5 08/20/2022   CO2 23 08/20/2022   GLUCOSE 99 08/20/2022   BUN 10 08/20/2022   CREATININE 0.62 08/20/2022   BILITOT 0.5 08/20/2022   ALKPHOS 144 (H) 08/20/2022   AST 49 (H) 08/20/2022   ALT 57 (H) 08/20/2022   PROT 6.8 08/20/2022   ALBUMIN 4.3 08/20/2022   CALCIUM 9.3 08/20/2022   ANIONGAP 8 09/30/2017   EGFR 106 08/20/2022   Lab Results  Component Value Date   CHOL 195 08/20/2022   Lab Results  Component Value Date   HDL 48 08/20/2022   Lab Results  Component Value Date   LDLCALC 132 (H) 08/20/2022   Lab Results  Component Value Date   TRIG 82 08/20/2022   Lab Results  Component Value Date   CHOLHDL 4.1 08/20/2022   Lab Results   Component Value Date   HGBA1C 4.9 09/29/2018      Assessment & Plan:  Acute pansinusitis, recurrence not specified Assessment & Plan: Started on prednisone tapering dose. Advised to take over-the-counter antihistamines for postnasal drip. Advised to use warm compress over-the-counter ear pain relieving drops. She do not want to get started on antibiotic at present and will let us know if the symptom does not improves. Increase hydration and use humidifier or steam.   Other orders -     predniSONE; Take 4 tablets ( total 40 mg) by mouth for 2 days; take 3 tablets ( total 30 mg) by mouth for 2 days; take 2 tablets ( total 20 mg) by mouth for 1 day; take 1 tablet ( total 10 mg) by mouth for 1 day.  Dispense: 17 tablet; Refill: 0    Follow-up: No follow-ups on file.   Theresia Lo, NP

## 2022-10-21 NOTE — Assessment & Plan Note (Addendum)
Started on prednisone tapering dose. Advised to take over-the-counter antihistamines for postnasal drip. Advised to use warm compress over-the-counter ear pain relieving drops. She do not want to get started on antibiotic at present and will let us know if the symptom does not improves. Increase hydration and use humidifier or steam.

## 2022-11-04 DIAGNOSIS — J302 Other seasonal allergic rhinitis: Secondary | ICD-10-CM | POA: Diagnosis not present

## 2022-11-04 DIAGNOSIS — J301 Allergic rhinitis due to pollen: Secondary | ICD-10-CM | POA: Diagnosis not present

## 2022-11-04 DIAGNOSIS — J018 Other acute sinusitis: Secondary | ICD-10-CM | POA: Diagnosis not present

## 2022-11-20 ENCOUNTER — Ambulatory Visit: Payer: BC Managed Care – PPO | Admitting: Internal Medicine

## 2022-11-20 ENCOUNTER — Encounter: Payer: Self-pay | Admitting: Internal Medicine

## 2022-11-20 VITALS — BP 126/72 | HR 88 | Temp 98.3°F | Resp 16 | Ht 66.0 in | Wt 242.0 lb

## 2022-11-20 DIAGNOSIS — N809 Endometriosis, unspecified: Secondary | ICD-10-CM

## 2022-11-20 DIAGNOSIS — Z83719 Family history of colon polyps, unspecified: Secondary | ICD-10-CM

## 2022-11-20 DIAGNOSIS — R7989 Other specified abnormal findings of blood chemistry: Secondary | ICD-10-CM | POA: Diagnosis not present

## 2022-11-20 DIAGNOSIS — F439 Reaction to severe stress, unspecified: Secondary | ICD-10-CM

## 2022-11-20 DIAGNOSIS — E7800 Pure hypercholesterolemia, unspecified: Secondary | ICD-10-CM | POA: Diagnosis not present

## 2022-11-20 DIAGNOSIS — K219 Gastro-esophageal reflux disease without esophagitis: Secondary | ICD-10-CM

## 2022-11-20 MED ORDER — PANTOPRAZOLE SODIUM 40 MG PO TBEC: 40.0000 mg | DELAYED_RELEASE_TABLET | Freq: Two times a day (BID) | ORAL | 1 refills | Status: DC

## 2022-11-20 NOTE — Progress Notes (Signed)
Subjective:    Patient ID: Katie [name], female    DOB: 14-Feb-1966, 57 y.o.   MRN: [id number]  Patient here for  Chief Complaint  Patient presents with   Medical Management of Chronic Issues    HPI Here to follow up regarding GERD, NAFLD and hypercholesterolemia. Saw ENT.  Given prednisone and abx.  Allergy testing.  Symptoms have improved.  Doing saline nasal spray and steroid nasal spray.  Has f/u planned with ENT.  No chest pain or sob reported.  No increased abdominal pain. Being followed by GI - NAFLD, IBS and GERD.  Has f/u next week.   Past Medical History:  Diagnosis Date   Anemia    Endometriosis    Erosive gastritis    GERD (gastroesophageal reflux disease)    History of kidney stones    Hyperplastic colon polyp    Irritable bowel syndrome    Kidney stones    Migraines    Nonalcoholic fatty liver disease    Ovarian cyst    Positional vertigo    PT helped   Past Surgical History:  Procedure Laterality Date   ABDOMINAL HYSTERECTOMY  2000   left oophorectomy, Dr Mia Creek   APPENDECTOMY  2000   CESAREAN SECTION     COLONOSCOPY     ESOPHAGOGASTRODUODENOSCOPY     EUS N/A 12/04/2017   Procedure: ESOPHAGEAL ENDOSCOPIC ULTRASOUND (EUS) RADIAL;  Surgeon: Bearl Mulberry, MD;  Location: [facility] ENDOSCOPY;  Service: Gastroenterology;  Laterality: N/A;   EXPLORATORY LAPAROTOMY     IMAGE GUIDED SINUS SURGERY Bilateral 08/29/2016   Procedure: IMAGE GUIDED SINUS SURGERY;  Surgeon: Vernie Murders, MD;  Location: [facility];  Service: ENT;  Laterality: Bilateral;  NEED DISK GAVE DISK TO CECE 1-23 KP   LITHOTRIPSY     RIGHT OOPHORECTOMY  2002   SEPTOPLASTY Bilateral 08/29/2016   Procedure: SEPTOPLASTY;  Surgeon: Vernie Murders, MD;  Location: [facility];  Service: ENT;  Laterality: Bilateral;   SPHENOIDECTOMY Right 08/29/2016   Procedure: SPHENOIDECTOMY  with removal content right;  Surgeon: Vernie Murders, MD;  Location: [facility];  Service: ENT;   Laterality: Right;   VAGINAL BIRTH AFTER CESAREAN SECTION     Family History  Problem Relation Age of Onset   Hypertension Mother    Diabetes Mother    Thyroid cancer Mother    Colon polyps Mother    Thyroid cancer Maternal Aunt    Colon polyps Maternal Aunt    Kidney cancer Maternal Uncle    Breast cancer Cousin        maternal   Social History   Socioeconomic History   Marital status: Married    Spouse name: Not on file   Number of children: 2   Years of education: Not on file   Highest education level: Some college, no degree  Occupational History   Not on file  Tobacco Use   Smoking status: Never   Smokeless tobacco: Never  Vaping Use   Vaping Use: Never used  Substance and Sexual Activity   Alcohol use: No    Alcohol/week: 0.0 standard drinks of alcohol   Drug use: No   Sexual activity: Not on file    Comment: Married   Other Topics Concern   Not on file  Social History Narrative   Not on file   Social Determinants of Health   Financial Resource Strain: Patient Declined (11/17/2022)   Overall Financial Resource Strain (CARDIA)    Difficulty of Paying  Living Expenses: Patient declined  Food Insecurity: No Food Insecurity (11/17/2022)   Hunger Vital Sign    Worried About Running Out of Food in the Last Year: Never true    Ran Out of Food in the Last Year: Never true  Transportation Needs: No Transportation Needs (11/17/2022)   PRAPARE - Administrator, Civil Service (Medical): No    Lack of Transportation (Non-Medical): No  Physical Activity: Insufficiently Active (11/17/2022)   Exercise Vital Sign    Days of Exercise per Week: 2 days    Minutes of Exercise per Session: 20 min  Stress: No Stress Concern Present (11/17/2022)   [employer] of Occupational Health - Occupational Stress Questionnaire    Feeling of Stress : Not at all  Social Connections: Socially Integrated (11/17/2022)   Social Connection and Isolation Panel [NHANES]     Frequency of Communication with Friends and Family: Twice a week    Frequency of Social Gatherings with Friends and Family: Once a week    Attends Religious Services: More than 4 times per year    Active Member of [employer] or Organizations: Yes    Attends Engineer, structural: More than 4 times per year    Marital Status: Married     Review of Systems  Constitutional:  Negative for appetite change and unexpected weight change.  HENT:  Negative for congestion and sinus pressure.   Respiratory:  Negative for cough, chest tightness and shortness of breath.   Cardiovascular:  Negative for chest pain and palpitations.  Gastrointestinal:  Negative for diarrhea, nausea and vomiting.  Genitourinary:  Negative for difficulty urinating and dysuria.  Musculoskeletal:  Negative for joint swelling and myalgias.  Skin:  Negative for color change and rash.  Neurological:  Negative for dizziness and headaches.  Psychiatric/Behavioral:  Negative for agitation and dysphoric mood.        Objective:     BP 126/72   Pulse 88   Temp 98.3 F (36.8 C)   Resp 16   Ht 5\' 6"  (1.676 m)   Wt 242 lb (109.8 kg)   LMP 08/27/1998   SpO2 98%   BMI 39.06 kg/m  Wt Readings from Last 3 Encounters:  11/20/22 242 lb (109.8 kg)  10/03/22 240 lb 3.2 oz (109 kg)  08/20/22 238 lb (108 kg)    Physical Exam Vitals reviewed.  Constitutional:      General: She is not in acute distress.    Appearance: Normal appearance.  HENT:     Head: Normocephalic and atraumatic.     Right Ear: External ear normal.     Left Ear: External ear normal.  Eyes:     General: No scleral icterus.       Right [name]: No discharge.        Left [name]: No discharge.     Conjunctiva/sclera: Conjunctivae normal.  Neck:     Thyroid: No thyromegaly.  Cardiovascular:     Rate and Rhythm: Normal rate and regular rhythm.  Pulmonary:     Effort: No respiratory distress.     Breath sounds: Normal breath sounds. No wheezing.   Abdominal:     General: Bowel sounds are normal.     Palpations: Abdomen is soft.     Tenderness: There is no abdominal tenderness.  Musculoskeletal:        General: No swelling or tenderness.     Cervical back: Neck supple. No tenderness.  Lymphadenopathy:     Cervical: No  cervical adenopathy.  Skin:    Findings: No erythema or rash.  Neurological:     Mental Status: She is alert.  Psychiatric:        Mood and Affect: Mood normal.        Behavior: Behavior normal.      Outpatient Encounter Medications as of 11/20/2022  Medication Sig   pantoprazole (PROTONIX) 40 MG tablet Take 1 tablet (40 mg total) by mouth 2 (two) times daily before a meal.   [DISCONTINUED] Multiple Vitamins-Minerals (WOMENS MULTIVITAMIN PO) Take by mouth.   [DISCONTINUED] pantoprazole (PROTONIX) 40 MG tablet Take 1 tablet (40 mg total) by mouth 2 (two) times daily before a meal.   [DISCONTINUED] predniSONE (DELTASONE) 10 MG tablet Take 4 tablets ( total 40 mg) by mouth for 2 days; take 3 tablets ( total 30 mg) by mouth for 2 days; take 2 tablets ( total 20 mg) by mouth for 1 day; take 1 tablet ( total 10 mg) by mouth for 1 day.   No facility-administered encounter medications on file as of 11/20/2022.     Lab Results  Component Value Date   WBC 6.2 08/20/2022   HGB 13.1 08/20/2022   HCT 39.9 08/20/2022   PLT 200 08/20/2022   GLUCOSE 100 (H) 11/20/2022   CHOL 190 11/20/2022   TRIG 110 11/20/2022   HDL 59 11/20/2022   LDLCALC 111 (H) 11/20/2022   ALT 48 (H) 11/20/2022   AST 32 11/20/2022   NA 141 11/20/2022   K 4.3 11/20/2022   CL 104 11/20/2022   CREATININE 0.71 11/20/2022   BUN 13 11/20/2022   CO2 21 11/20/2022   TSH 1.810 08/20/2022   HGBA1C 4.9 09/29/2018    MM 3D SCREEN BREAST BILATERAL  Result Date: 06/04/2022 CLINICAL DATA:  Screening. EXAM: DIGITAL SCREENING BILATERAL MAMMOGRAM WITH TOMOSYNTHESIS AND CAD TECHNIQUE: Bilateral screening digital craniocaudal and mediolateral oblique  mammograms were obtained. Bilateral screening digital breast tomosynthesis was performed. The images were evaluated with computer-aided detection. COMPARISON:  Previous exam(s). ACR Breast Density Category b: There are scattered areas of fibroglandular density. FINDINGS: There are no findings suspicious for malignancy. IMPRESSION: No mammographic evidence of malignancy. A result letter of this screening mammogram will be mailed directly to the patient. RECOMMENDATION: Screening mammogram in one year. (Code:SM-B-01Y) BI-RADS CATEGORY  1: Negative. Electronically Signed   By: Sherian Rein M.D.   On: 06/04/2022 13:43       Assessment & Plan:  Hypercholesteremia -     Hepatic function panel -     Basic metabolic panel -     Lipid panel  Abnormal liver function tests Assessment & Plan: Diagnosed with NAFLD.  Has been evaluated by GI.  Continue diet and exercise.  Follow liver function tests.  Discussed with GI - [place] elastography liver.  Has f/u next week.    Endometriosis Assessment & Plan: S/p hysterectomy.  Previous pap with ASCUS.  Saw Dr Elesa Massed.  S/p colposcopy.  Recommended pap in 3 years.  PAP 04/16/21 - negative with negative HPV.    Family history of colonic polyps Assessment & Plan: Colonoscopy 10/2019.    Gastroesophageal reflux disease, unspecified whether esophagitis present Assessment & Plan: Is s/p EGD 10/2019.  On protonix.  Has f/u with GI next week.    Stress Assessment & Plan: Overall reports handling things relatively well.  Follow.     Other orders -     Pantoprazole Sodium; Take 1 tablet (40 mg total) by mouth 2 (two)  times daily before a meal.  Dispense: 180 tablet; Refill: 1     Dale Irwindale, MD

## 2022-11-21 LAB — LIPID PANEL
Chol/HDL Ratio: 3.2 ratio (ref 0.0–4.4)
Cholesterol, Total: 190 mg/dL (ref 100–199)
HDL: 59 mg/dL (ref >39)
LDL Chol Calc (NIH): 111 mg/dL — ABNORMAL HIGH (ref 0–99)
Triglycerides: 110 mg/dL (ref 0–149)
VLDL Cholesterol Cal: 20 mg/dL (ref 5–40)

## 2022-11-21 LAB — BASIC METABOLIC PANEL
BUN/Creatinine Ratio: 18 (ref 9–23)
BUN: 13 mg/dL (ref 6–24)
CO2: 21 mmol/L (ref 20–29)
Calcium: 8.8 mg/dL (ref 8.7–10.2)
Chloride: 104 mmol/L (ref 96–106)
Creatinine, Ser: 0.71 mg/dL (ref 0.57–1.00)
Glucose: 100 mg/dL — ABNORMAL HIGH (ref 70–99)
Potassium: 4.3 mmol/L (ref 3.5–5.2)
Sodium: 141 mmol/L (ref 134–144)
eGFR: 100 mL/min/{1.73_m2} (ref >59)

## 2022-11-21 LAB — HEPATIC FUNCTION PANEL
ALT: 48 IU/L — ABNORMAL HIGH (ref 0–32)
AST: 32 IU/L (ref 0–40)
Albumin: 4 g/dL (ref 3.8–4.9)
Alkaline Phosphatase: 128 IU/L — ABNORMAL HIGH (ref 44–121)
Bilirubin Total: 0.7 mg/dL (ref 0.0–1.2)
Bilirubin, Direct: 0.21 mg/dL (ref 0.00–0.40)
Total Protein: 6.3 g/dL (ref 6.0–8.5)

## 2022-11-25 DIAGNOSIS — K760 Fatty (change of) liver, not elsewhere classified: Secondary | ICD-10-CM | POA: Diagnosis not present

## 2022-11-25 DIAGNOSIS — K582 Mixed irritable bowel syndrome: Secondary | ICD-10-CM | POA: Diagnosis not present

## 2022-11-25 DIAGNOSIS — J018 Other acute sinusitis: Secondary | ICD-10-CM | POA: Diagnosis not present

## 2022-11-25 DIAGNOSIS — J302 Other seasonal allergic rhinitis: Secondary | ICD-10-CM | POA: Diagnosis not present

## 2022-12-01 ENCOUNTER — Encounter: Payer: Self-pay | Admitting: Internal Medicine

## 2022-12-01 NOTE — Assessment & Plan Note (Signed)
Diagnosed with NAFLD.  Has been evaluated by GI.  Continue diet and exercise.  Follow liver function tests.  Discussed with GI - [place] elastography liver.  Has f/u next week.

## 2022-12-01 NOTE — Assessment & Plan Note (Signed)
Overall reports handling things relatively well.  Follow.   

## 2022-12-01 NOTE — Assessment & Plan Note (Signed)
Colonoscopy 10/2019.  

## 2022-12-01 NOTE — Assessment & Plan Note (Signed)
S/p hysterectomy.  Previous pap with ASCUS.  Saw Dr Ward.  S/p colposcopy.  Recommended pap in 3 years.  PAP 04/16/21 - negative with negative HPV.  

## 2022-12-01 NOTE — Assessment & Plan Note (Signed)
Is s/p EGD 10/2019.  On protonix.  Has f/u with GI next week.

## 2022-12-05 DIAGNOSIS — K580 Irritable bowel syndrome with diarrhea: Secondary | ICD-10-CM | POA: Diagnosis not present

## 2022-12-05 DIAGNOSIS — R140 Abdominal distension (gaseous): Secondary | ICD-10-CM | POA: Diagnosis not present

## 2022-12-05 DIAGNOSIS — R109 Unspecified abdominal pain: Secondary | ICD-10-CM | POA: Diagnosis not present

## 2022-12-05 DIAGNOSIS — R142 Eructation: Secondary | ICD-10-CM | POA: Diagnosis not present

## 2023-04-21 ENCOUNTER — Other Ambulatory Visit: Payer: Self-pay | Admitting: Internal Medicine

## 2023-04-21 DIAGNOSIS — Z1231 Encounter for screening mammogram for malignant neoplasm of breast: Secondary | ICD-10-CM

## 2023-04-25 ENCOUNTER — Ambulatory Visit (INDEPENDENT_AMBULATORY_CARE_PROVIDER_SITE_OTHER): Payer: BC Managed Care – PPO | Admitting: Internal Medicine

## 2023-04-25 ENCOUNTER — Encounter: Payer: Self-pay | Admitting: Internal Medicine

## 2023-04-25 VITALS — BP 120/72 | HR 86 | Temp 98.0°F | Resp 16 | Ht 66.0 in | Wt 241.0 lb

## 2023-04-25 DIAGNOSIS — Z0000 Encounter for general adult medical examination without abnormal findings: Secondary | ICD-10-CM

## 2023-04-25 DIAGNOSIS — Z136 Encounter for screening for cardiovascular disorders: Secondary | ICD-10-CM

## 2023-04-25 DIAGNOSIS — E7800 Pure hypercholesterolemia, unspecified: Secondary | ICD-10-CM

## 2023-04-25 DIAGNOSIS — Z860100 Personal history of colon polyps, unspecified: Secondary | ICD-10-CM

## 2023-04-25 DIAGNOSIS — Z862 Personal history of diseases of the blood and blood-forming organs and certain disorders involving the immune mechanism: Secondary | ICD-10-CM

## 2023-04-25 DIAGNOSIS — R7989 Other specified abnormal findings of blood chemistry: Secondary | ICD-10-CM

## 2023-04-25 DIAGNOSIS — Z23 Encounter for immunization: Secondary | ICD-10-CM | POA: Diagnosis not present

## 2023-04-25 DIAGNOSIS — N951 Menopausal and female climacteric states: Secondary | ICD-10-CM

## 2023-04-25 DIAGNOSIS — K219 Gastro-esophageal reflux disease without esophagitis: Secondary | ICD-10-CM

## 2023-04-25 DIAGNOSIS — F439 Reaction to severe stress, unspecified: Secondary | ICD-10-CM

## 2023-04-25 MED ORDER — PANTOPRAZOLE SODIUM 40 MG PO TBEC: 40.0000 mg | DELAYED_RELEASE_TABLET | Freq: Two times a day (BID) | ORAL | 3 refills | Status: DC

## 2023-04-25 MED ORDER — VENLAFAXINE HCL ER 37.5 MG PO CP24: 37.5000 mg | ORAL_CAPSULE | Freq: Every day | ORAL | 2 refills | Status: DC

## 2023-04-25 NOTE — Assessment & Plan Note (Signed)
Physical today 04/25/23.  PAP 04/13/21- negative with negative HPV.  Mammogram 06/03/22 - Birads I. Colonoscopy 10/2019 - f/u in 5 years.

## 2023-04-25 NOTE — Progress Notes (Unsigned)
Subjective:    Patient ID: Katie [name], female    DOB: 27-Jul-1965, 57 y.o.   MRN: [id number]  Patient here for  Chief Complaint  Patient presents with   Annual Exam    HPI Here for a physical exam. Being followed by GI - NAFLD, IBS and GERD. Saw GI 11/25/22 - NAFLD - stable/improving. Discussed diet and exercise.  IBS - recommended benefiber daily.  Hydrogen breath test - positive.  Treated with xifaxin.  Helped her GI symptoms.  No bloating.  Her main concern today is hot flashes, skin changes and irritability.  She has had a hysterectomy and oophorectomy.  Concerned regarding menopause.  Symptoms have increased the last 3-4 months.  Feels she needs something to help level things out. Discussed treatment options. She wishes to avoid estrogen.  Discussed SSRI treatment or SNRI.     Past Medical History:  Diagnosis Date   Anemia    Endometriosis    Erosive gastritis    GERD (gastroesophageal reflux disease)    History of kidney stones    Hyperplastic colon polyp    Irritable bowel syndrome    Kidney stones    Migraines    Nonalcoholic fatty liver disease    Ovarian cyst    Positional vertigo    PT helped   Past Surgical History:  Procedure Laterality Date   ABDOMINAL HYSTERECTOMY  2000   left oophorectomy, Dr Mia Creek   APPENDECTOMY  2000   CESAREAN SECTION     COLONOSCOPY     ESOPHAGOGASTRODUODENOSCOPY     EUS N/A 12/04/2017   Procedure: ESOPHAGEAL ENDOSCOPIC ULTRASOUND (EUS) RADIAL;  Surgeon: Bearl Mulberry, MD;  Location: [facility] END[facility]COPY;  Service: Gastroenterology;  Laterality: N/A;   EXPLORATORY LAPAROTOMY     IMAGE GUIDED SINUS SURGERY Bilateral 08/29/2016   Procedure: IMAGE GUIDED SINUS SURGERY;  Surgeon: Vernie Murders, MD;  Location: [facility];  Service: ENT;  Laterality: Bilateral;  NEED DISK GAVE DISK TO CECE 1-23 KP   LITHOTRIPSY     RIGHT OOPHORECTOMY  2002   SEPTOPLASTY Bilateral 08/29/2016   Procedure: SEPTOPLASTY;  Surgeon: Vernie Murders, MD;   Location: [facility];  Service: ENT;  Laterality: Bilateral;   SPHENOIDECTOMY Right 08/29/2016   Procedure: SPHENOIDECTOMY  with removal content right;  Surgeon: Vernie Murders, MD;  Location: [facility];  Service: ENT;  Laterality: Right;   VAGINAL BIRTH AFTER CESAREAN SECTION     Family History  Problem Relation Age of Onset   Hypertension Mother    Diabetes Mother    Thyroid cancer Mother    Colon polyps Mother    Thyroid cancer Maternal Aunt    Colon polyps Maternal Aunt    Kidney cancer Maternal Uncle    Breast cancer Cousin        maternal   Social History   Socioeconomic History   Marital status: Married    Spouse name: Not on file   Number of children: 2   Years of education: Not on file   Highest education level: Some college, no degree  Occupational History   Not on file  Tobacco Use   Smoking status: Never   Smokeless tobacco: Never  Vaping Use   Vaping status: Never Used  Substance and Sexual Activity   Alcohol use: No    Alcohol/week: 0.0 standard drinks of alcohol   Drug use: No   Sexual activity: Not on file    Comment: Married   Other Topics Concern  Not on file  Social History Narrative   Not on file   Social Determinants of Health   Financial Resource Strain: Patient Declined (11/17/2022)   Overall Financial Resource Strain (CARDIA)    Difficulty of Paying Living Expenses: Patient declined  Food Insecurity: No Food Insecurity (11/17/2022)   Hunger Vital Sign    Worried About Running Out of Food in the Last Year: Never true    Ran Out of Food in the Last Year: Never true  Transportation Needs: No Transportation Needs (11/17/2022)   PRAPARE - Administrator, Civil Service (Medical): No    Lack of Transportation (Non-Medical): No  Physical Activity: Insufficiently Active (11/17/2022)   Exercise Vital Sign    Days of Exercise per Week: 2 days    Minutes of Exercise per Session: 20 min  Stress: No Stress Concern Present  (11/17/2022)   [employer] of Occupational Health - Occupational Stress Questionnaire    Feeling of Stress : Not at all  Social Connections: Socially Integrated (11/17/2022)   Social Connection and Isolation Panel [NHANES]    Frequency of Communication with Friends and Family: Twice a week    Frequency of Social Gatherings with Friends and Family: Once a week    Attends Religious Services: More than 4 times per year    Active Member of [employer] or Organizations: Yes    Attends Engineer, structural: More than 4 times per year    Marital Status: Married     Review of Systems  Constitutional:  Negative for appetite change.       Discussed weight - diet and exercise.   HENT:  Negative for congestion, sinus pressure and sore throat.   Eyes:  Negative for pain and visual disturbance.  Respiratory:  Negative for cough, chest tightness and shortness of breath.   Cardiovascular:  Negative for chest pain and palpitations.       Some feet/ankle swelling - end of day.  Better in am.    Gastrointestinal:  Negative for abdominal pain, diarrhea, nausea and vomiting.  Genitourinary:  Negative for difficulty urinating and dysuria.  Musculoskeletal:  Negative for joint swelling and myalgias.  Skin:  Negative for color change and rash.  Neurological:  Negative for dizziness and headaches.  Hematological:  Negative for adenopathy. Does not bruise/bleed easily.  Psychiatric/Behavioral:  Negative for agitation and dysphoric mood.        Objective:     BP 120/72   Pulse 86   Temp 98 F (36.7 C)   Resp 16   Ht 5\' 6"  (1.676 m)   Wt 241 lb (109.3 kg)   LMP 08/27/1998   SpO2 98%   BMI 38.90 kg/m  Wt Readings from Last 3 Encounters:  04/25/23 241 lb (109.3 kg)  11/20/22 242 lb (109.8 kg)  10/03/22 240 lb 3.2 oz (109 kg)    Physical Exam Vitals reviewed.  Constitutional:      General: She is not in acute distress.    Appearance: Normal appearance. She is well-developed.  HENT:      Head: Normocephalic and atraumatic.     Right Ear: External ear normal.     Left Ear: External ear normal.  Eyes:     General: No scleral icterus.       Right [name]: No discharge.        Left [name]: No discharge.     Conjunctiva/sclera: Conjunctivae normal.  Neck:     Thyroid: No thyromegaly.  Cardiovascular:  Rate and Rhythm: Normal rate and regular rhythm.  Pulmonary:     Effort: No tachypnea, accessory muscle usage or respiratory distress.     Breath sounds: Normal breath sounds. No decreased breath sounds or wheezing.  Chest:  Breasts:    Right: No inverted nipple, mass, nipple discharge or tenderness (no axillary adenopathy).     Left: No inverted nipple, mass, nipple discharge or tenderness (no axilarry adenopathy).  Abdominal:     General: Bowel sounds are normal.     Palpations: Abdomen is soft.     Tenderness: There is no abdominal tenderness.  Musculoskeletal:        General: No swelling or tenderness.     Cervical back: Neck supple.  Lymphadenopathy:     Cervical: No cervical adenopathy.  Skin:    Findings: No erythema or rash.  Neurological:     Mental Status: She is alert and oriented to person, place, and time.  Psychiatric:        Mood and Affect: Mood normal.        Behavior: Behavior normal.      Outpatient Encounter Medications as of 04/25/2023  Medication Sig   venlafaxine XR (EFFEXOR XR) 37.5 MG 24 hr capsule Take 1 capsule (37.5 mg total) by mouth daily with breakfast.   pantoprazole (PROTONIX) 40 MG tablet Take 1 tablet (40 mg total) by mouth 2 (two) times daily before a meal.   [DISCONTINUED] pantoprazole (PROTONIX) 40 MG tablet Take 1 tablet (40 mg total) by mouth 2 (two) times daily before a meal.   No facility-administered encounter medications on file as of 04/25/2023.     Lab Results  Component Value Date   WBC 6.2 04/25/2023   HGB 13.1 04/25/2023   HCT 41.0 04/25/2023   PLT 191 04/25/2023   GLUCOSE 103 (H) 04/25/2023   CHOL 206 (H)  04/25/2023   TRIG 122 04/25/2023   HDL 52 04/25/2023   LDLCALC 132 (H) 04/25/2023   ALT 65 (H) 04/25/2023   AST 84 (H) 04/25/2023   NA 141 04/25/2023   K 4.5 04/25/2023   CL 103 04/25/2023   CREATININE 0.66 04/25/2023   BUN 16 04/25/2023   CO2 23 04/25/2023   TSH 1.730 04/25/2023   HGBA1C 4.9 09/29/2018    MM 3D SCREEN BREAST BILATERAL  Result Date: 06/04/2022 CLINICAL DATA:  Screening. EXAM: DIGITAL SCREENING BILATERAL MAMMOGRAM WITH TOMOSYNTHESIS AND CAD TECHNIQUE: Bilateral screening digital craniocaudal and mediolateral oblique mammograms were obtained. Bilateral screening digital breast tomosynthesis was performed. The images were evaluated with computer-aided detection. COMPARISON:  Previous exam(s). ACR Breast Density Category b: There are scattered areas of fibroglandular density. FINDINGS: There are no findings suspicious for malignancy. IMPRESSION: No mammographic evidence of malignancy. A result letter of this screening mammogram will be mailed directly to the patient. RECOMMENDATION: Screening mammogram in one year. (Code:SM-B-01Y) BI-RADS CATEGORY  1: Negative. Electronically Signed   By: Sherian Rein M.D.   On: 06/04/2022 13:43       Assessment & Plan:  Routine general medical examination at a health care facility  Health care maintenance Assessment & Plan: Physical today 04/25/23.  PAP 04/13/21- negative with negative HPV.  Mammogram 06/03/22 - Birads I. Colonoscopy 10/2019 - f/u in 5 years.     Hypercholesteremia Assessment & Plan: Low cholesterol diet and exercise.  Follow lipid panel.   Orders: -     Lipid panel -     Hepatic function panel -     Basic metabolic  panel -     TSH  History of anemia -     CBC with Differential/Platelet  Abnormal liver function tests Assessment & Plan: Diagnosed with NAFLD.  Has been evaluated by GI.  Continue diet and exercise.  Follow liver function tests.  Followed by GI.    Need for influenza vaccination -     Flu  vaccine trivalent PF, 6mos and older(Flulaval,Afluria,Fluarix,Fluzone)  Encounter for screening for coronary artery disease Assessment & Plan: Discussed coronary artery screening.  Discussed CT calcium score.  Agreeable.   Orders: -     CT CARDIAC SCORING (SELF PAY ONLY); Future  Gastroesophageal reflux disease, unspecified whether esophagitis present Assessment & Plan: Is s/p EGD 10/2019.  On protonix.  Followed by GI.    History of colonic polyps Assessment & Plan: Colonoscopy 10/2019.  Recommended f/u colonoscopy in 5 years.    Menopausal symptoms Assessment & Plan: Hot flashes as outlined.  Have discussed treatment options.  Desires to hold on starting estrogen therapy.  Elects to start effexor.  Will start effexor XR 37.5mg  q day.  Follow.    Stress Assessment & Plan: Overall appears to be handling things relatively well.  Follow.     Other orders -     Pantoprazole Sodium; Take 1 tablet (40 mg total) by mouth 2 (two) times daily before a meal.  Dispense: 180 tablet; Refill: 3 -     Venlafaxine HCl ER; Take 1 capsule (37.5 mg total) by mouth daily with breakfast.  Dispense: 30 capsule; Refill: 2     Dale Onton, MD

## 2023-04-26 LAB — CBC WITH DIFFERENTIAL/PLATELET
Basophils Absolute: 0 10*3/uL (ref 0.0–0.2)
Basos: 1 %
EOS (ABSOLUTE): 0.2 10*3/uL (ref 0.0–0.4)
Eos: 2 %
Hematocrit: 41 % (ref 34.0–46.6)
Hemoglobin: 13.1 g/dL (ref 11.1–15.9)
Immature Grans (Abs): 0 10*3/uL (ref 0.0–0.1)
Immature Granulocytes: 0 %
Lymphocytes Absolute: 1.4 10*3/uL (ref 0.7–3.1)
Lymphs: 23 %
MCH: 28.6 pg (ref 26.6–33.0)
MCHC: 32 g/dL (ref 31.5–35.7)
MCV: 90 fL (ref 79–97)
Monocytes Absolute: 0.3 10*3/uL (ref 0.1–0.9)
Monocytes: 5 %
Neutrophils Absolute: 4.2 10*3/uL (ref 1.4–7.0)
Neutrophils: 69 %
Platelets: 191 10*3/uL (ref 150–450)
RBC: 4.58 x10E6/uL (ref 3.77–5.28)
RDW: 13.8 % (ref 11.7–15.4)
WBC: 6.2 10*3/uL (ref 3.4–10.8)

## 2023-04-26 LAB — BASIC METABOLIC PANEL
BUN/Creatinine Ratio: 24 — ABNORMAL HIGH (ref 9–23)
BUN: 16 mg/dL (ref 6–24)
CO2: 23 mmol/L (ref 20–29)
Calcium: 9.5 mg/dL (ref 8.7–10.2)
Chloride: 103 mmol/L (ref 96–106)
Creatinine, Ser: 0.66 mg/dL (ref 0.57–1.00)
Glucose: 103 mg/dL — ABNORMAL HIGH (ref 70–99)
Potassium: 4.5 mmol/L (ref 3.5–5.2)
Sodium: 141 mmol/L (ref 134–144)
eGFR: 104 mL/min/{1.73_m2} (ref >59)

## 2023-04-26 LAB — LIPID PANEL
Chol/HDL Ratio: 4 {ratio} (ref 0.0–4.4)
Cholesterol, Total: 206 mg/dL — ABNORMAL HIGH (ref 100–199)
HDL: 52 mg/dL (ref >39)
LDL Chol Calc (NIH): 132 mg/dL — ABNORMAL HIGH (ref 0–99)
Triglycerides: 122 mg/dL (ref 0–149)
VLDL Cholesterol Cal: 22 mg/dL (ref 5–40)

## 2023-04-26 LAB — TSH: TSH: 1.73 u[IU]/mL (ref 0.450–4.500)

## 2023-04-26 LAB — HEPATIC FUNCTION PANEL
ALT: 65 [IU]/L — ABNORMAL HIGH (ref 0–32)
AST: 84 [IU]/L — ABNORMAL HIGH (ref 0–40)
Albumin: 4 g/dL (ref 3.8–4.9)
Alkaline Phosphatase: 135 [IU]/L — ABNORMAL HIGH (ref 44–121)
Bilirubin Total: 0.6 mg/dL (ref 0.0–1.2)
Bilirubin, Direct: 0.18 mg/dL (ref 0.00–0.40)
Total Protein: 6.6 g/dL (ref 6.0–8.5)

## 2023-04-27 ENCOUNTER — Encounter: Payer: Self-pay | Admitting: Internal Medicine

## 2023-04-27 DIAGNOSIS — Z136 Encounter for screening for cardiovascular disorders: Secondary | ICD-10-CM | POA: Insufficient documentation

## 2023-04-27 NOTE — Assessment & Plan Note (Signed)
Is s/p EGD 10/2019.  On protonix.  Followed by GI.

## 2023-04-27 NOTE — Assessment & Plan Note (Signed)
Colonoscopy 10/2019.  Recommended f/u colonoscopy in 5 years.  

## 2023-04-27 NOTE — Assessment & Plan Note (Signed)
Discussed coronary artery screening.  Discussed CT calcium score.  Agreeable.

## 2023-04-27 NOTE — Assessment & Plan Note (Signed)
Overall appears to be handling things relatively well.  Follow.   

## 2023-04-27 NOTE — Assessment & Plan Note (Signed)
Low cholesterol diet and exercise.  Follow lipid panel.   

## 2023-04-27 NOTE — Assessment & Plan Note (Signed)
Hot flashes as outlined.  Have discussed treatment options.  Desires to hold on starting estrogen therapy.  Elects to start effexor.  Will start effexor XR 37.5mg  q day.  Follow.

## 2023-04-27 NOTE — Assessment & Plan Note (Signed)
Diagnosed with NAFLD.  Has been evaluated by GI.  Continue diet and exercise.  Follow liver function tests.  Followed by GI.

## 2023-04-28 ENCOUNTER — Telehealth: Payer: Self-pay | Admitting: *Deleted

## 2023-04-28 DIAGNOSIS — R748 Abnormal levels of other serum enzymes: Secondary | ICD-10-CM

## 2023-04-28 NOTE — Telephone Encounter (Signed)
Left voicemail to return call. (See below)

## 2023-04-28 NOTE — Telephone Encounter (Addendum)
-----   Message from [place] sent at 04/27/2023  7:50 PM EDT -----  Your cholesterol increased some from last check.  Liver function tests have increased from last check.  Has been elevated in the past.  Has seen GI (gastroenterology).  Need to confirm has f/u with [facility].  Let me know when is scheduled. Recommend diet and exercise.  Hemoglobin, thyroid test and kidney function tests are normal.

## 2023-04-28 NOTE — Telephone Encounter (Signed)
Remain off effexor.  Confirm symptoms resolve.  Once confirm symptoms resolve, consider trial of another medication.  I would like to recheck her liver panel within the next couple of weeks to see if liver function levels off.

## 2023-04-28 NOTE — Telephone Encounter (Signed)
Pt called back and note was read and she has a follow-up with Gi next year. Pt also stated that she took the medication venlafaxine one dose of it and she felt funny. Pt stated it felt like her heart was racing and she had a headache

## 2023-04-29 ENCOUNTER — Encounter: Payer: Self-pay | Admitting: *Deleted

## 2023-04-29 ENCOUNTER — Ambulatory Visit
Admission: RE | Admit: 2023-04-29 | Discharge: 2023-04-29 | Disposition: A | Discharge status: Home / Self Care | Payer: BC Managed Care – PPO | Source: Ambulatory Visit | Attending: Internal Medicine | Admitting: Internal Medicine

## 2023-04-29 DIAGNOSIS — Z136 Encounter for screening for cardiovascular disorders: Secondary | ICD-10-CM | POA: Insufficient documentation

## 2023-04-29 NOTE — Telephone Encounter (Signed)
Pt aware to remain of Effexor. HEr sx's are better but she still feels shkay/jittery (internally).  She has a OV on 06/09/23 & wants to know if she can recheck her lab at that time.

## 2023-04-29 NOTE — Telephone Encounter (Signed)
Given the level, I would like to recheck before her appt

## 2023-05-13 ENCOUNTER — Other Ambulatory Visit: Payer: Self-pay

## 2023-05-13 DIAGNOSIS — R911 Solitary pulmonary nodule: Secondary | ICD-10-CM

## 2023-05-13 MED ORDER — ROSUVASTATIN CALCIUM 10 MG PO TABS: 10.0000 mg | ORAL_TABLET | Freq: Every day | ORAL | 1 refills | Status: DC

## 2023-05-18 ENCOUNTER — Other Ambulatory Visit: Payer: Self-pay | Admitting: Internal Medicine

## 2023-06-05 ENCOUNTER — Other Ambulatory Visit: Payer: Self-pay | Admitting: Internal Medicine

## 2023-06-05 ENCOUNTER — Ambulatory Visit
Admission: RE | Admit: 2023-06-05 | Discharge: 2023-06-05 | Disposition: A | Discharge status: Home / Self Care | Payer: BC Managed Care – PPO | Source: Ambulatory Visit | Attending: Internal Medicine | Admitting: Internal Medicine

## 2023-06-05 DIAGNOSIS — Z1231 Encounter for screening mammogram for malignant neoplasm of breast: Secondary | ICD-10-CM | POA: Diagnosis not present

## 2023-06-08 NOTE — Progress Notes (Unsigned)
Subjective:    Patient ID: Katie [name], female    DOB: 07-16-1966, 57 y.o.   MRN: [id number]  Patient here for No chief complaint on file.   HPI Here for a scheduled follow up - f/u regarding recent concerns regarding hot flashes, irritability and skin changes. Started on effexor.  Also last labs revealed elevated liver function tests.  Has been worked up by GI.    Past Medical History:  Diagnosis Date   Anemia    Endometriosis    Erosive gastritis    GERD (gastroesophageal reflux disease)    History of kidney stones    Hyperplastic colon polyp    Irritable bowel syndrome    Kidney stones    Migraines    Nonalcoholic fatty liver disease    Ovarian cyst    Positional vertigo    PT helped   Past Surgical History:  Procedure Laterality Date   ABDOMINAL HYSTERECTOMY  2000   left oophorectomy, Dr Mia Creek   APPENDECTOMY  2000   CESAREAN SECTION     COLONOSCOPY     ESOPHAGOGASTRODUODENOSCOPY     EUS N/A 12/04/2017   Procedure: ESOPHAGEAL ENDOSCOPIC ULTRASOUND (EUS) RADIAL;  Surgeon: Bearl Mulberry, MD;  Location: [facility] ENDOSCOPY;  Service: Gastroenterology;  Laterality: N/A;   EXPLORATORY LAPAROTOMY     IMAGE GUIDED SINUS SURGERY Bilateral 08/29/2016   Procedure: IMAGE GUIDED SINUS SURGERY;  Surgeon: Vernie Murders, MD;  Location: [name];  Service: ENT;  Laterality: Bilateral;  NEED DISK GAVE DISK TO CECE 1-23 KP   LITHOTRIPSY     RIGHT OOPHORECTOMY  2002   SEPTOPLASTY Bilateral 08/29/2016   Procedure: SEPTOPLASTY;  Surgeon: Vernie Murders, MD;  Location: [facility];  Service: ENT;  Laterality: Bilateral;   SPHENOIDECTOMY Right 08/29/2016   Procedure: SPHENOIDECTOMY  with removal content right;  Surgeon: Vernie Murders, MD;  Location: [facility];  Service: ENT;  Laterality: Right;   VAGINAL BIRTH AFTER CESAREAN SECTION     Family History  Problem Relation Age of Onset   Hypertension Mother    Diabetes Mother    Thyroid cancer Mother     Colon polyps Mother    Thyroid cancer Maternal Aunt    Colon polyps Maternal Aunt    Kidney cancer Maternal Uncle    Breast cancer Cousin        maternal   Social History   Socioeconomic History   Marital status: Married    Spouse name: Not on file   Number of children: 2   Years of education: Not on file   Highest education level: Some college, no degree  Occupational History   Not on file  Tobacco Use   Smoking status: Never   Smokeless tobacco: Never  Vaping Use   Vaping status: Never Used  Substance and Sexual Activity   Alcohol use: No    Alcohol/week: 0.0 standard drinks of alcohol   Drug use: No   Sexual activity: Not on file    Comment: Married   Other Topics Concern   Not on file  Social History Narrative   Not on file   Social Determinants of Health   Financial Resource Strain: Low Risk  (06/05/2023)   Overall Financial Resource Strain (CARDIA)    Difficulty of Paying Living Expenses: Not very hard  Food Insecurity: No Food Insecurity (06/05/2023)   Hunger Vital Sign    Worried About Running Out of Food in the Last Year: Never true  Ran Out of Food in the Last Year: Never true  Transportation Needs: No Transportation Needs (06/05/2023)   PRAPARE - Administrator, Civil Service (Medical): No    Lack of Transportation (Non-Medical): No  Physical Activity: Insufficiently Active (06/05/2023)   Exercise Vital Sign    Days of Exercise per Week: 1 day    Minutes of Exercise per Session: 20 min  Stress: No Stress Concern Present (06/05/2023)   [employer] of Occupational Health - Occupational Stress Questionnaire    Feeling of Stress : Only a little  Social Connections: Socially Integrated (06/05/2023)   Social Connection and Isolation Panel [NHANES]    Frequency of Communication with Friends and Family: More than three times a week    Frequency of Social Gatherings with Friends and Family: Once a week    Attends Religious Services: More  than 4 times per year    Active Member of [employer] or Organizations: Yes    Attends Banker Meetings: More than 4 times per year    Marital Status: Married     Review of Systems     Objective:     LMP 08/27/1998  Wt Readings from Last 3 Encounters:  04/25/23 241 lb (109.3 kg)  11/20/22 242 lb (109.8 kg)  10/03/22 240 lb 3.2 oz (109 kg)    Physical Exam   Outpatient Encounter Medications as of 06/09/2023  Medication Sig   pantoprazole (PROTONIX) 40 MG tablet Take 1 tablet (40 mg total) by mouth 2 (two) times daily before a meal.   rosuvastatin (CRESTOR) 10 MG tablet TAKE 1 TABLET BY MOUTH EVERY DAY   venlafaxine XR (EFFEXOR-XR) 37.5 MG 24 hr capsule TAKE 1 CAPSULE BY MOUTH DAILY WITH BREAKFAST.   No facility-administered encounter medications on file as of 06/09/2023.     Lab Results  Component Value Date   WBC 6.2 04/25/2023   HGB 13.1 04/25/2023   HCT 41.0 04/25/2023   PLT 191 04/25/2023   GLUCOSE 103 (H) 04/25/2023   CHOL 206 (H) 04/25/2023   TRIG 122 04/25/2023   HDL 52 04/25/2023   LDLCALC 132 (H) 04/25/2023   ALT 65 (H) 04/25/2023   AST 84 (H) 04/25/2023   NA 141 04/25/2023   K 4.5 04/25/2023   CL 103 04/25/2023   CREATININE 0.66 04/25/2023   BUN 16 04/25/2023   CO2 23 04/25/2023   TSH 1.730 04/25/2023   HGBA1C 4.9 09/29/2018    No results found.     Assessment & Plan:  There are no diagnoses linked to this encounter.   Dale Underwood, MD

## 2023-06-09 ENCOUNTER — Encounter: Payer: Self-pay | Admitting: Internal Medicine

## 2023-06-09 ENCOUNTER — Ambulatory Visit: Payer: BC Managed Care – PPO | Admitting: Internal Medicine

## 2023-06-09 VITALS — BP 118/70 | HR 89 | Temp 98.0°F | Resp 16 | Ht 66.0 in | Wt 242.8 lb

## 2023-06-09 DIAGNOSIS — K219 Gastro-esophageal reflux disease without esophagitis: Secondary | ICD-10-CM | POA: Diagnosis not present

## 2023-06-09 DIAGNOSIS — E7800 Pure hypercholesterolemia, unspecified: Secondary | ICD-10-CM

## 2023-06-09 DIAGNOSIS — Z136 Encounter for screening for cardiovascular disorders: Secondary | ICD-10-CM | POA: Diagnosis not present

## 2023-06-09 DIAGNOSIS — R7989 Other specified abnormal findings of blood chemistry: Secondary | ICD-10-CM

## 2023-06-09 DIAGNOSIS — R918 Other nonspecific abnormal finding of lung field: Secondary | ICD-10-CM | POA: Insufficient documentation

## 2023-06-09 DIAGNOSIS — F439 Reaction to severe stress, unspecified: Secondary | ICD-10-CM

## 2023-06-09 DIAGNOSIS — Z860100 Personal history of colon polyps, unspecified: Secondary | ICD-10-CM

## 2023-06-09 NOTE — Assessment & Plan Note (Signed)
Low cholesterol diet and exercise.  Follow lipid panel. Taking crestor.  Tolerating.  Check liver panel today.

## 2023-06-09 NOTE — Addendum Note (Signed)
Addended by: Rita Ohara D on: 06/09/2023 07:56 AM   Modules accepted: Orders

## 2023-06-09 NOTE — Assessment & Plan Note (Signed)
Overall appears to be handling things relatively well.  Follow.   

## 2023-06-09 NOTE — Assessment & Plan Note (Signed)
Colonoscopy 10/2019.  Recommended f/u colonoscopy in 5 years.  

## 2023-06-09 NOTE — Assessment & Plan Note (Signed)
Calcium score 17.  Started on statin medication.

## 2023-06-09 NOTE — Assessment & Plan Note (Signed)
Noted on calcium score.  Schedule to see pulmonary tomorrow.  Breathing stable.

## 2023-06-09 NOTE — Assessment & Plan Note (Signed)
Diagnosed with NAFLD.  Has been evaluated by GI.  Continue diet and exercise.  Follow liver function tests.  Followed by GI. Check liver panel today. Diet and exercise.

## 2023-06-09 NOTE — Assessment & Plan Note (Signed)
Is s/p EGD 10/2019.  On protonix.  Followed by GI.

## 2023-06-10 ENCOUNTER — Ambulatory Visit: Payer: BC Managed Care – PPO | Admitting: Student in an Organized Health Care Education/Training Program

## 2023-06-10 ENCOUNTER — Encounter: Payer: Self-pay | Admitting: Student in an Organized Health Care Education/Training Program

## 2023-06-10 VITALS — BP 136/86 | HR 74 | Temp 97.8°F | Ht 66.0 in | Wt 243.0 lb

## 2023-06-10 DIAGNOSIS — R918 Other nonspecific abnormal finding of lung field: Secondary | ICD-10-CM

## 2023-06-10 LAB — HEPATIC FUNCTION PANEL
ALT: 54 [IU]/L — ABNORMAL HIGH (ref 0–32)
AST: 54 [IU]/L — ABNORMAL HIGH (ref 0–40)
Albumin: 4.1 g/dL (ref 3.8–4.9)
Alkaline Phosphatase: 130 [IU]/L — ABNORMAL HIGH (ref 44–121)
Bilirubin Total: 0.5 mg/dL (ref 0.0–1.2)
Bilirubin, Direct: 0.21 mg/dL (ref 0.00–0.40)
Total Protein: 6.6 g/dL (ref 6.0–8.5)

## 2023-06-10 NOTE — Progress Notes (Signed)
Synopsis: Referred in pulmonary nodules by Dale Crowley, MD  Assessment & Plan:   # Pulmonary nodules:  Patient is presenting for the evaluation of pulmonary nodules noted incidentally on her cardiac CT.  I have reviewed the patient's CT scan and compared to a previous CT scan of the abdomen with lung windows performed in 2018.  I have personally identified 5 pulmonary nodules on the patient's current cardiac CT, all of which were present in a similar size and fashion on the previous abdominal CT performed 6 years ago.  I have reviewed the images with the patient showing all identified nodules without any change.  Given the patient is overall lower risk, and the fact that the nodules have not changed over the past 6 months, there is no role for further imaging to monitor said nodules.  Return if symptoms worsen or fail to improve.  I spent 50 minutes caring for this patient today, including preparing to see the patient, obtaining a medical history , reviewing a separately obtained history, performing a medically appropriate examination and/or evaluation, counseling and educating the patient/family/caregiver, documenting clinical information in the electronic health record, and independently interpreting results (not separately reported/billed) and communicating results to the patient/family/caregiver  Raechel Chute, MD [facility] 06/10/2023 10:15 AM    End of visit medications:  No orders of the defined types were placed in this encounter.    Current Outpatient Medications:    pantoprazole (PROTONIX) 40 MG tablet, Take 1 tablet (40 mg total) by mouth 2 (two) times daily before a meal., Disp: 180 tablet, Rfl: 3   rosuvastatin (CRESTOR) 10 MG tablet, TAKE 1 TABLET BY MOUTH EVERY DAY, Disp: 90 tablet, Rfl: 1   Subjective:   PATIENT ID: Katie [name] GENDER: female DOB: 09-04-1965, MRN: [id number]  Chief Complaint  Patient presents with   Consult    No  cough, shortness of breath on exertion or wheezing.     HPI  The patient is a pleasant 57 year old female presenting to clinic for the evaluation of pulmonary nodules.  Patient was in her usual state of health without any respiratory symptoms and was ordered a cardiac CT by her primary care physician.  The CT was performed on 04/29/2023 and was noted for multiple subcentimeter pulmonary nodules for which she is currently referred.  She does not have any chest pain, shortness of breath, cough, sputum production, wheeze, fevers, chills or history of recurrent pulmonary infections.  Her past medical history is notable for reflux disease as well as nonalcoholic fatty liver disease and hyperlipidemia.  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  She denies any occupational exposures.  She has no smoking history and denies any cigarette smoking or vape use.  She does report secondhand smoke exposure when she was growing up.  No family history of lung cancer reported, no personal history of malignancy reported.  Ancillary information including prior medications, full medical/surgical/family/social histories, and PFTs (when available) are listed below and have been reviewed.   Review of Systems  Constitutional:  Negative for chills, fever, malaise/fatigue and weight loss.  Respiratory:  Negative for cough, hemoptysis, sputum production, shortness of breath and wheezing.   Cardiovascular:  Negative for chest pain.     Objective:   Vitals:   06/10/23 0917  BP: 136/86  Pulse: 74  Temp: 97.8 F (36.6 C)  TempSrc: Temporal  SpO2: 99%  Weight: 243 lb (110.2 kg)  Height: 5\' 6"  (1.676 m)   99%  on RA  BMI Readings from Last 3 Encounters:  06/10/23 39.22 kg/m  06/09/23 39.19 kg/m  04/25/23 38.90 kg/m   Wt Readings from Last 3 Encounters:  06/10/23 243 lb (110.2 kg)  06/09/23 242 lb 12.8 oz (110.1 kg)  04/25/23 241 lb (109.3 kg)    Physical Exam Constitutional:       Appearance: Normal appearance.  Cardiovascular:     Rate and Rhythm: Normal rate and regular rhythm.     Pulses: Normal pulses.     Heart sounds: Normal heart sounds.  Pulmonary:     Effort: Pulmonary effort is normal.     Breath sounds: Normal breath sounds.  Abdominal:     Palpations: Abdomen is soft.  Neurological:     General: No focal deficit present.     Mental Status: She is alert and oriented to person, place, and time. Mental status is at baseline.       Ancillary Information    Past Medical History:  Diagnosis Date   Anemia    Endometriosis    Erosive gastritis    GERD (gastroesophageal reflux disease)    History of kidney stones    Hyperplastic colon polyp    Irritable bowel syndrome    Kidney stones    Migraines    Nonalcoholic fatty liver disease    Ovarian cyst    Positional vertigo    PT helped     Family History  Problem Relation Age of Onset   Hypertension Mother    Diabetes Mother    Thyroid cancer Mother    Colon polyps Mother    Thyroid cancer Maternal Aunt    Colon polyps Maternal Aunt    Kidney cancer Maternal Uncle    Breast cancer Cousin        maternal     Past Surgical History:  Procedure Laterality Date   ABDOMINAL HYSTERECTOMY  2000   left oophorectomy, Dr Mia Creek   APPENDECTOMY  2000   CESAREAN SECTION     COLONOSCOPY     ESOPHAGOGASTRODUODENOSCOPY     EUS N/A 12/04/2017   Procedure: ESOPHAGEAL ENDOSCOPIC ULTRASOUND (EUS) RADIAL;  Surgeon: Bearl Mulberry, MD;  Location: [facility] ENDOSCOPY;  Service: Gastroenterology;  Laterality: N/A;   EXPLORATORY LAPAROTOMY     IMAGE GUIDED SINUS SURGERY Bilateral 08/29/2016   Procedure: IMAGE GUIDED SINUS SURGERY;  Surgeon: Vernie Murders, MD;  Location: [facility];  Service: ENT;  Laterality: Bilateral;  NEED DISK GAVE DISK TO CECE 1-23 KP   LITHOTRIPSY     RIGHT OOPHORECTOMY  2002   SEPTOPLASTY Bilateral 08/29/2016   Procedure: SEPTOPLASTY;  Surgeon: Vernie Murders, MD;   Location: [facility];  Service: ENT;  Laterality: Bilateral;   SPHENOIDECTOMY Right 08/29/2016   Procedure: SPHENOIDECTOMY  with removal content right;  Surgeon: Vernie Murders, MD;  Location: [facility];  Service: ENT;  Laterality: Right;   VAGINAL BIRTH AFTER CESAREAN SECTION      Social History   Socioeconomic History   Marital status: Married    Spouse name: Not on file   Number of children: 2   Years of education: Not on file   Highest education level: Some college, no degree  Occupational History   Not on file  Tobacco Use   Smoking status: Never   Smokeless tobacco: Never  Vaping Use   Vaping status: Never Used  Substance and Sexual Activity   Alcohol use: No    Alcohol/week: 0.0 standard drinks of alcohol  Drug use: No   Sexual activity: Not on file    Comment: Married   Other Topics Concern   Not on file  Social History Narrative   Not on file   Social Determinants of Health   Financial Resource Strain: Low Risk  (06/05/2023)   Overall Financial Resource Strain (CARDIA)    Difficulty of Paying Living Expenses: Not very hard  Food Insecurity: No Food Insecurity (06/05/2023)   Hunger Vital Sign    Worried About Running Out of Food in the Last Year: Never true    Ran Out of Food in the Last Year: Never true  Transportation Needs: No Transportation Needs (06/05/2023)   PRAPARE - Administrator, Civil Service (Medical): No    Lack of Transportation (Non-Medical): No  Physical Activity: Insufficiently Active (06/05/2023)   Exercise Vital Sign    Days of Exercise per Week: 1 day    Minutes of Exercise per Session: 20 min  Stress: No Stress Concern Present (06/05/2023)   [employer] of Occupational Health - Occupational Stress Questionnaire    Feeling of Stress : Only a little  Social Connections: Socially Integrated (06/05/2023)   Social Connection and Isolation Panel [NHANES]    Frequency of Communication with Friends and  Family: More than three times a week    Frequency of Social Gatherings with Friends and Family: Once a week    Attends Religious Services: More than 4 times per year    Active Member of [employer] or Organizations: Yes    Attends Engineer, structural: More than 4 times per year    Marital Status: Married  Catering manager Violence: Not on file     Allergies  Allergen Reactions   Doxycycline Diarrhea and Nausea And Vomiting        Levaquin [Levofloxacin]     Joint pain   Shellfish Allergy Nausea And Vomiting   Amoxicillin Rash   Codeine Other (See Comments)    Makes her feel disoriented   Contrast Media [Iodinated Contrast Media] Rash   Penicillins Rash    Has patient had a PCN reaction causing immediate rash, facial/tongue/throat swelling, SOB or lightheadedness with hypotension: Yes Has patient had a PCN reaction causing severe rash involving mucus membranes or skin necrosis: No Has patient had a PCN reaction that required hospitalization: No Has patient had a PCN reaction occurring within the last 10 years: Yes If all of the above answers are "NO", then may proceed with Cephalosporin use.  **No issues with CEFTIN**     CBC    Component Value Date/Time   WBC 6.2 04/25/2023 0912   WBC 6.7 09/30/2017 0837   RBC 4.58 04/25/2023 0912   RBC 4.55 09/21/2020 0000   HGB 13.1 04/25/2023 0912   HCT 41.0 04/25/2023 0912   PLT 191 04/25/2023 0912   MCV 90 04/25/2023 0912   MCV 83 07/03/2014 1854   MCH 28.6 04/25/2023 0912   MCH 27.1 09/30/2017 0837   MCHC 32.0 04/25/2023 0912   MCHC 33.0 09/30/2017 0837   RDW 13.8 04/25/2023 0912   RDW 14.9 (H) 07/03/2014 1854   LYMPHSABS 1.4 04/25/2023 0912   LYMPHSABS 2.0 07/03/2014 1854   MONOABS 0.4 07/03/2014 1854   EOSABS 0.2 04/25/2023 0912   EOSABS 0.1 07/03/2014 1854   BASOSABS 0.0 04/25/2023 0912   BASOSABS 0.0 07/03/2014 1854    Pulmonary Functions Testing Results:     No data to display  Outpatient  Medications Prior to Visit  Medication Sig Dispense Refill   pantoprazole (PROTONIX) 40 MG tablet Take 1 tablet (40 mg total) by mouth 2 (two) times daily before a meal. 180 tablet 3   rosuvastatin (CRESTOR) 10 MG tablet TAKE 1 TABLET BY MOUTH EVERY DAY 90 tablet 1   No facility-administered medications prior to visit.

## 2023-06-11 ENCOUNTER — Encounter: Payer: Self-pay | Admitting: *Deleted

## 2023-06-12 ENCOUNTER — Telehealth: Payer: Self-pay

## 2023-06-12 NOTE — Telephone Encounter (Signed)
During phone converstation pt reported that she had an appointment coming up to see [employer] .  After speaking with pt checked chart and her next GI appointment is not until 12/02/23.  Mychart message sent requesting pt make a f/u appointment sooner.

## 2023-06-12 NOTE — Telephone Encounter (Signed)
-----   Message from [place] sent at 06/11/2023  4:44 AM EST ----- Please notify - liver function tests remaining elevated.  Has seen GI [facility] Kathryne Hitch). Please call and schedule f/u with Christiane - f/u abnormal liver function.

## 2023-09-10 IMAGING — MG MM DIGITAL SCREENING BILAT W/ TOMO AND CAD
8 series · 8 of 24 positions shown · non-contrast
Comparison: Previous exam(s).

CLINICAL DATA: Screening.

EXAM:
DIGITAL SCREENING BILATERAL MAMMOGRAM WITH TOMOSYNTHESIS AND CAD
TECHNIQUE: Bilateral screening digital craniocaudal and mediolateral oblique
mammograms were obtained. Bilateral screening digital breast
tomosynthesis was performed. The images were evaluated with
computer-aided detection.

[R CC synth-2D]
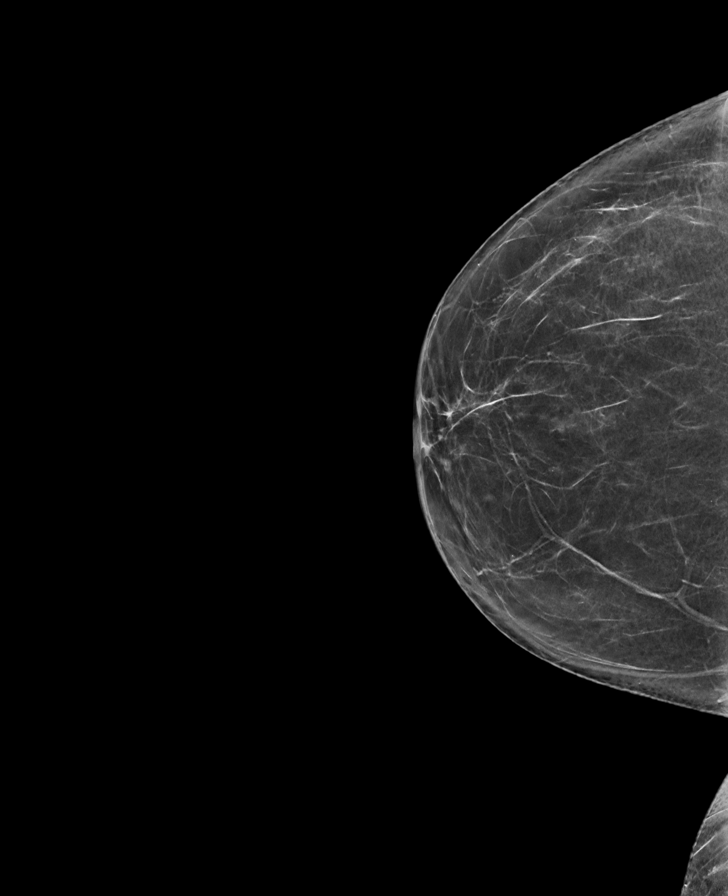

[L CC synth-2D]
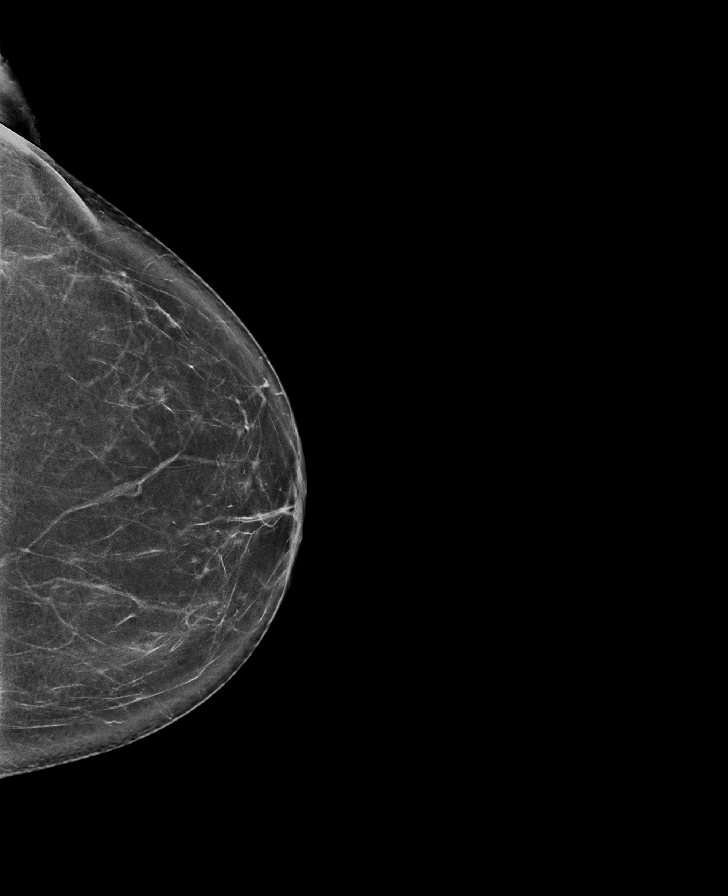

[L MLO synth-2D]
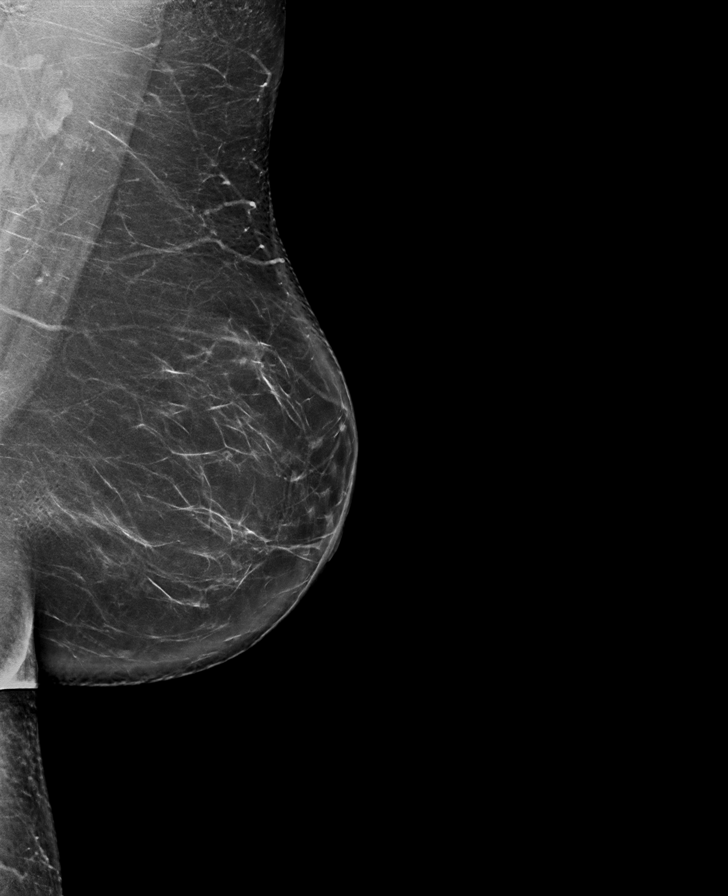

[R MLO synth-2D]
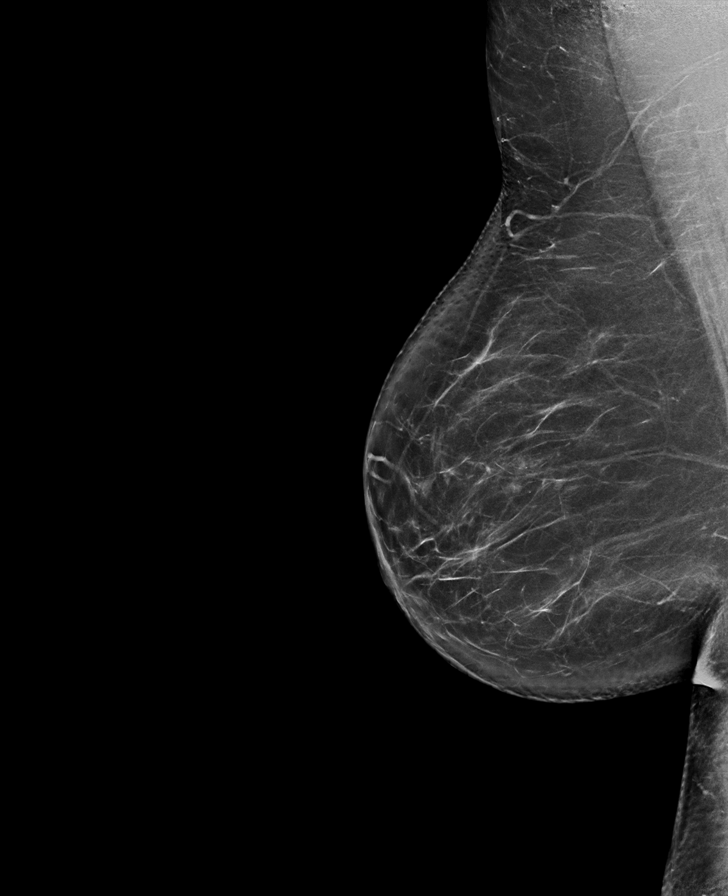

[R CC tomo · tomo slice 35/69.0]
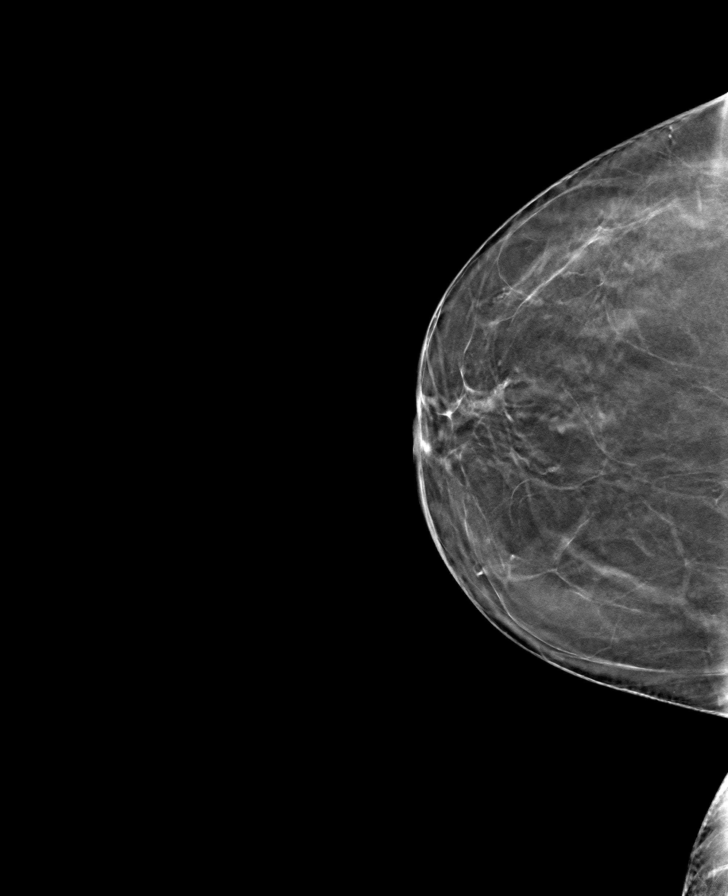

[L MLO tomo · tomo slice 45/88.0]
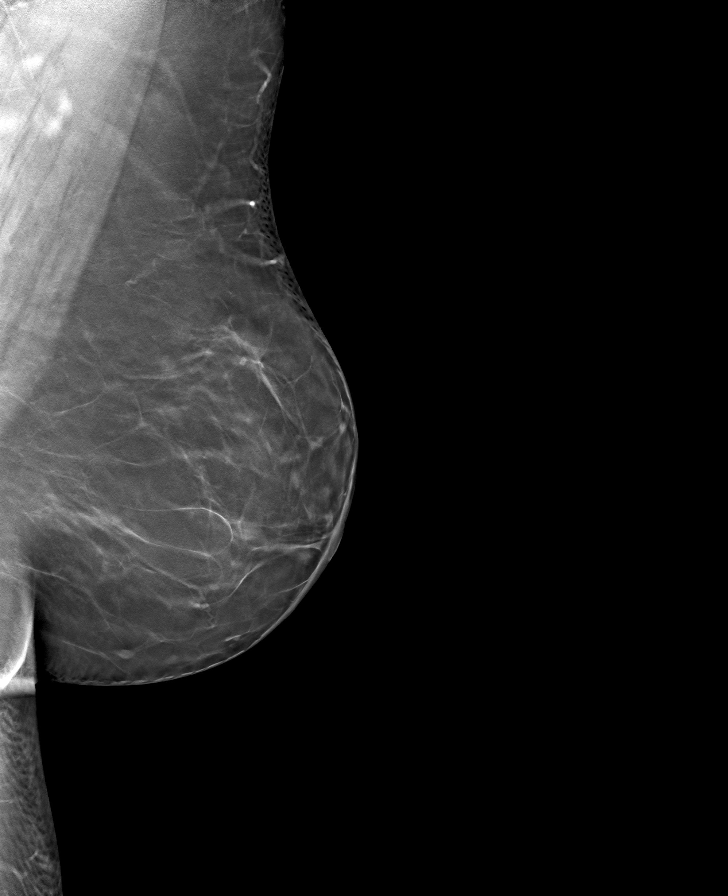

[R MLO tomo · tomo slice 41/81.0]
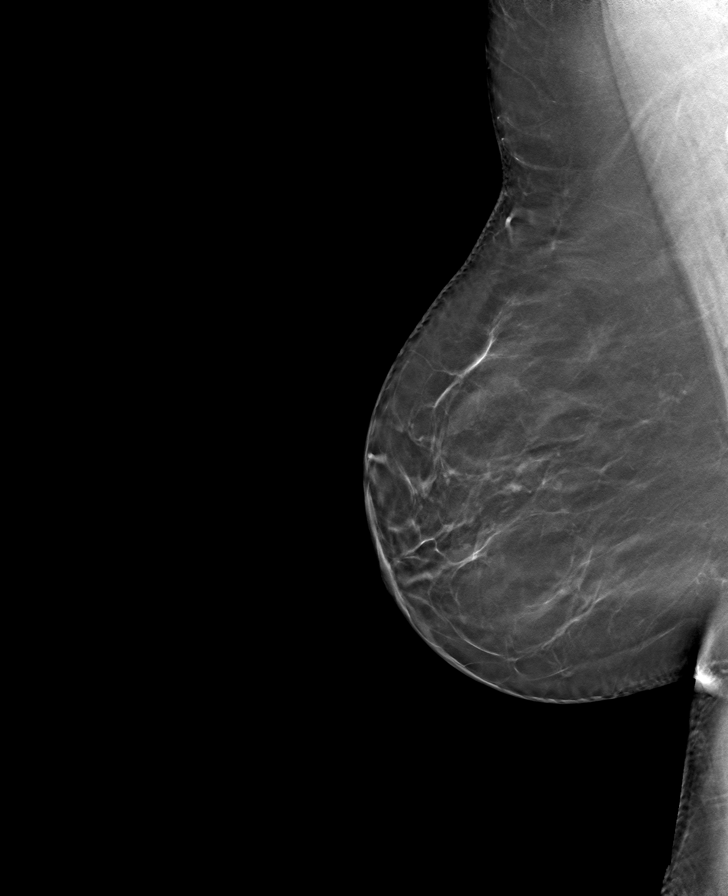

[L CC tomo · tomo slice 40/79.0]
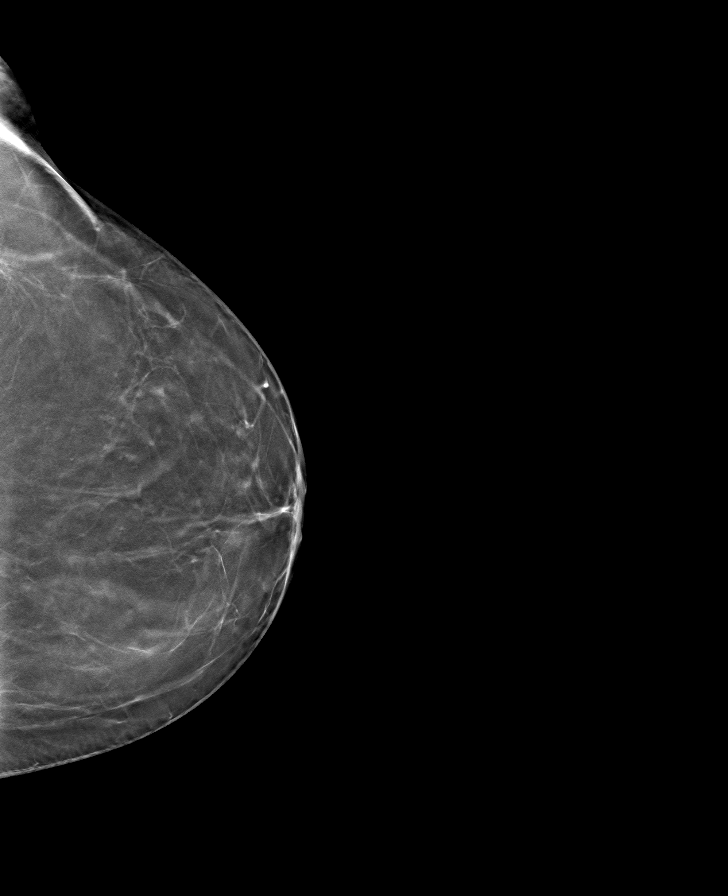

[8 of 24 positions shown; findings below may reference images not displayed]

ACR Breast Density Category b: There are scattered areas of
fibroglandular density.
FINDINGS: There are no findings suspicious for malignancy.
IMPRESSION: No mammographic evidence of malignancy. A result letter of this
screening mammogram will be mailed directly to the patient.

RECOMMENDATION:
Screening mammogram in one year. (Code:51-O-LD2)

BI-RADS CATEGORY  1: Negative.

## 2023-09-19 ENCOUNTER — Encounter: Payer: Self-pay | Admitting: Internal Medicine

## 2023-09-24 DIAGNOSIS — E7800 Pure hypercholesterolemia, unspecified: Secondary | ICD-10-CM | POA: Diagnosis not present

## 2023-09-25 LAB — HEPATIC FUNCTION PANEL
ALT: 43 IU/L — ABNORMAL HIGH (ref 0–32)
AST: 45 IU/L — ABNORMAL HIGH (ref 0–40)
Albumin: 4 g/dL (ref 3.8–4.9)
Alkaline Phosphatase: 142 IU/L — ABNORMAL HIGH (ref 44–121)
Bilirubin Total: 0.5 mg/dL (ref 0.0–1.2)
Bilirubin, Direct: 0.18 mg/dL (ref 0.00–0.40)
Total Protein: 6.3 g/dL (ref 6.0–8.5)

## 2023-09-25 LAB — BASIC METABOLIC PANEL
BUN/Creatinine Ratio: 19 (ref 9–23)
BUN: 14 mg/dL (ref 6–24)
CO2: 23 mmol/L (ref 20–29)
Calcium: 8.9 mg/dL (ref 8.7–10.2)
Chloride: 107 mmol/L — ABNORMAL HIGH (ref 96–106)
Creatinine, Ser: 0.73 mg/dL (ref 0.57–1.00)
Glucose: 105 mg/dL — ABNORMAL HIGH (ref 70–99)
Potassium: 4.1 mmol/L (ref 3.5–5.2)
Sodium: 142 mmol/L (ref 134–144)
eGFR: 97 mL/min/{1.73_m2} (ref >59)

## 2023-09-25 LAB — LIPID PANEL
Chol/HDL Ratio: 4.1 ratio (ref 0.0–4.4)
Cholesterol, Total: 203 mg/dL — ABNORMAL HIGH (ref 100–199)
HDL: 49 mg/dL (ref >39)
LDL Chol Calc (NIH): 133 mg/dL — ABNORMAL HIGH (ref 0–99)
Triglycerides: 119 mg/dL (ref 0–149)
VLDL Cholesterol Cal: 21 mg/dL (ref 5–40)

## 2023-09-26 ENCOUNTER — Telehealth: Payer: Self-pay

## 2023-09-26 NOTE — Telephone Encounter (Signed)
 Noted.

## 2023-09-26 NOTE — Telephone Encounter (Signed)
 tCopied from CRM [id number]. Topic: Clinical - Lab/Test Results >> Sep 26, 2023  1:19 PM Gurney Maxin H wrote: Reason for CRM: Patient called to obtain lab results, gave patient message from provider as stated, patient acknowledged. Patient also stated she does have a follow up appointment with Vevelyn Pat at [facility]. Patient states she will speak with Dr. Lorin Picket about having the follow up ultrasound at her visit on 3/18.

## 2023-10-07 ENCOUNTER — Ambulatory Visit: Payer: BC Managed Care – PPO | Admitting: Internal Medicine

## 2023-10-07 ENCOUNTER — Encounter: Payer: Self-pay | Admitting: Internal Medicine

## 2023-10-07 VITALS — BP 128/74 | HR 86 | Temp 98.0°F | Resp 16 | Ht 66.0 in | Wt 242.8 lb

## 2023-10-07 DIAGNOSIS — F439 Reaction to severe stress, unspecified: Secondary | ICD-10-CM | POA: Diagnosis not present

## 2023-10-07 DIAGNOSIS — R918 Other nonspecific abnormal finding of lung field: Secondary | ICD-10-CM | POA: Diagnosis not present

## 2023-10-07 DIAGNOSIS — E7800 Pure hypercholesterolemia, unspecified: Secondary | ICD-10-CM

## 2023-10-07 DIAGNOSIS — Z860100 Personal history of colon polyps, unspecified: Secondary | ICD-10-CM

## 2023-10-07 DIAGNOSIS — Z23 Encounter for immunization: Secondary | ICD-10-CM | POA: Diagnosis not present

## 2023-10-07 DIAGNOSIS — R7989 Other specified abnormal findings of blood chemistry: Secondary | ICD-10-CM

## 2023-10-07 DIAGNOSIS — K219 Gastro-esophageal reflux disease without esophagitis: Secondary | ICD-10-CM

## 2023-10-07 MED ORDER — ROSUVASTATIN CALCIUM 10 MG PO TABS: 10.0000 mg | ORAL_TABLET | Freq: Every day | ORAL | 3 refills | Status: AC

## 2023-10-07 NOTE — Assessment & Plan Note (Signed)
 Low cholesterol diet and exercise.  Follow lipid panel. Taking crestor.   Lab Results  Component Value Date   CHOL 203 (H) 09/24/2023   HDL 49 09/24/2023   LDLCALC 133 (H) 09/24/2023   TRIG 119 09/24/2023   CHOLHDL 4.1 09/24/2023

## 2023-10-07 NOTE — Assessment & Plan Note (Signed)
 Pulmonary evaluation 05/2023 - no further w/up warranted.

## 2023-10-07 NOTE — Progress Notes (Signed)
 Subjective:    Patient ID: Katie [name], female    DOB: [birth year], 58 y.o.   MRN: [id number]  Patient here for  Chief Complaint  Patient presents with   Medical Management of Chronic Issues    HPI Here for a scheduled follow up - follow up regarding hypercholesterolemia, abnormal liver function tests, GERD and increased stress. Recently evaluated by pulmonary 06/10/23 - f/u pulmonary nodules. Felt no further w/up warranted. Has been evaluated by GI for fatty liver and f/u IBS and GERD. Upper symptoms controlled on protonix. Continue benefiber.    Past Medical History:  Diagnosis Date   Anemia    Endometriosis    Erosive gastritis    GERD (gastroesophageal reflux disease)    History of kidney stones    Hyperplastic colon polyp    Irritable bowel syndrome    Kidney stones    Migraines    Nonalcoholic fatty liver disease    Ovarian cyst    Positional vertigo    PT helped   Past Surgical History:  Procedure Laterality Date   ABDOMINAL HYSTERECTOMY  2000   left oophorectomy, Dr Mia Creek   APPENDECTOMY  2000   CESAREAN SECTION     COLONOSCOPY     ESOPHAGOGASTRODUODENOSCOPY     EUS N/A 12/04/2017   Procedure: ESOPHAGEAL ENDOSCOPIC ULTRASOUND (EUS) RADIAL;  Surgeon: Bearl Mulberry, MD;  Location: [facility] ENDOSCOPY;  Service: Gastroenterology;  Laterality: N/A;   EXPLORATORY LAPAROTOMY     IMAGE GUIDED SINUS SURGERY Bilateral 08/29/2016   Procedure: IMAGE GUIDED SINUS SURGERY;  Surgeon: Vernie Murders, MD;  Location: [facility];  Service: ENT;  Laterality: Bilateral;  NEED DISK GAVE DISK TO CECE 1-23 KP   LITHOTRIPSY     RIGHT OOPHORECTOMY  2002   SEPTOPLASTY Bilateral 08/29/2016   Procedure: SEPTOPLASTY;  Surgeon: Vernie Murders, MD;  Location: [facility];  Service: ENT;  Laterality: Bilateral;   SPHENOIDECTOMY Right 08/29/2016   Procedure: SPHENOIDECTOMY  with removal content right;  Surgeon: Vernie Murders, MD;  Location: [facility];  Service: ENT;   Laterality: Right;   VAGINAL BIRTH AFTER CESAREAN SECTION     Family History  Problem Relation Age of Onset   Hypertension Mother    Diabetes Mother    Thyroid cancer Mother    Colon polyps Mother    Thyroid cancer Maternal Aunt    Colon polyps Maternal Aunt    Kidney cancer Maternal Uncle    Breast cancer Cousin        maternal   Social History   Socioeconomic History   Marital status: Married    Spouse name: Not on file   Number of children: 2   Years of education: Not on file   Highest education level: 12th grade  Occupational History   Not on file  Tobacco Use   Smoking status: Never   Smokeless tobacco: Never  Vaping Use   Vaping status: Never Used  Substance and Sexual Activity   Alcohol use: No    Alcohol/week: 0.0 standard drinks of alcohol   Drug use: No   Sexual activity: Not on file    Comment: Married   Other Topics Concern   Not on file  Social History Narrative   Not on file   Social Drivers of Health   Financial Resource Strain: Low Risk  (10/03/2023)   Overall Financial Resource Strain (CARDIA)    Difficulty of Paying Living Expenses: Not hard at all  Food Insecurity: No Food  Insecurity (10/03/2023)   Hunger Vital Sign    Worried About Running Out of Food in the Last Year: Never true    Ran Out of Food in the Last Year: Never true  Transportation Needs: No Transportation Needs (10/03/2023)   PRAPARE - Administrator, Civil Service (Medical): No    Lack of Transportation (Non-Medical): No  Physical Activity: Insufficiently Active (10/03/2023)   Exercise Vital Sign    Days of Exercise per Week: 1 day    Minutes of Exercise per Session: 20 min  Stress: No Stress Concern Present (10/03/2023)   [employer] of Occupational Health - Occupational Stress Questionnaire    Feeling of Stress : Only a little  Social Connections: Socially Integrated (10/03/2023)   Social Connection and Isolation Panel [NHANES]    Frequency of Communication  with Friends and Family: More than three times a week    Frequency of Social Gatherings with Friends and Family: Once a week    Attends Religious Services: More than 4 times per year    Active Member of [employer] or Organizations: Yes    Attends Engineer, structural: More than 4 times per year    Marital Status: Married     Review of Systems  Constitutional:  Negative for appetite change and unexpected weight change.  HENT:  Negative for congestion and sinus pressure.   Respiratory:  Negative for cough, chest tightness and shortness of breath.   Cardiovascular:  Negative for chest pain, palpitations and leg swelling.  Gastrointestinal:  Negative for abdominal pain, diarrhea, nausea and vomiting.  Genitourinary:  Negative for difficulty urinating and dysuria.  Musculoskeletal:  Negative for joint swelling and myalgias.  Skin:  Negative for color change and rash.  Neurological:  Negative for dizziness and headaches.  Psychiatric/Behavioral:  Negative for agitation and dysphoric mood.        Objective:     BP 128/74   Pulse 86   Temp 98 F (36.7 C)   Resp 16   Ht 5\' 6"  (1.676 m)   Wt 242 lb 12.8 oz (110.1 kg)   LMP 08/27/1998   SpO2 98%   BMI 39.19 kg/m  Wt Readings from Last 3 Encounters:  10/07/23 242 lb 12.8 oz (110.1 kg)  06/10/23 243 lb (110.2 kg)  06/09/23 242 lb 12.8 oz (110.1 kg)    Physical Exam Vitals reviewed.  Constitutional:      General: She is not in acute distress.    Appearance: Normal appearance.  HENT:     Head: Normocephalic and atraumatic.     Right Ear: External ear normal.     Left Ear: External ear normal.     Mouth/Throat:     Pharynx: No oropharyngeal exudate or posterior oropharyngeal erythema.  Eyes:     General: No scleral icterus.       Right [name]: No discharge.        Left [name]: No discharge.     Conjunctiva/sclera: Conjunctivae normal.  Neck:     Thyroid: No thyromegaly.  Cardiovascular:     Rate and Rhythm: Normal rate and  regular rhythm.  Pulmonary:     Effort: No respiratory distress.     Breath sounds: Normal breath sounds. No wheezing.  Abdominal:     General: Bowel sounds are normal.     Palpations: Abdomen is soft.     Tenderness: There is no abdominal tenderness.  Musculoskeletal:        General: No swelling or  tenderness.     Cervical back: Neck supple. No tenderness.  Lymphadenopathy:     Cervical: No cervical adenopathy.  Skin:    Findings: No erythema or rash.  Neurological:     Mental Status: She is alert.  Psychiatric:        Mood and Affect: Mood normal.        Behavior: Behavior normal.         Outpatient Encounter Medications as of 10/07/2023  Medication Sig   pantoprazole (PROTONIX) 40 MG tablet Take 1 tablet (40 mg total) by mouth 2 (two) times daily before a meal.   [DISCONTINUED] rosuvastatin (CRESTOR) 10 MG tablet TAKE 1 TABLET BY MOUTH EVERY DAY   rosuvastatin (CRESTOR) 10 MG tablet Take 1 tablet (10 mg total) by mouth daily.   No facility-administered encounter medications on file as of 10/07/2023.     Lab Results  Component Value Date   WBC 6.2 04/25/2023   HGB 13.1 04/25/2023   HCT 41.0 04/25/2023   PLT 191 04/25/2023   GLUCOSE 105 (H) 09/24/2023   CHOL 203 (H) 09/24/2023   TRIG 119 09/24/2023   HDL 49 09/24/2023   LDLCALC 133 (H) 09/24/2023   ALT 43 (H) 09/24/2023   AST 45 (H) 09/24/2023   NA 142 09/24/2023   K 4.1 09/24/2023   CL 107 (H) 09/24/2023   CREATININE 0.73 09/24/2023   BUN 14 09/24/2023   CO2 23 09/24/2023   TSH 1.730 04/25/2023   HGBA1C 4.9 09/29/2018    MM 3D SCREENING MAMMOGRAM BILATERAL BREAST Result Date: 06/09/2023 CLINICAL DATA:  Screening. EXAM: DIGITAL SCREENING BILATERAL MAMMOGRAM WITH TOMOSYNTHESIS AND CAD TECHNIQUE: Bilateral screening digital craniocaudal and mediolateral oblique mammograms were obtained. Bilateral screening digital breast tomosynthesis was performed. The images were evaluated with computer-aided detection.  COMPARISON:  Previous exam(s). ACR Breast Density Category a: The breasts are almost entirely fatty. FINDINGS: There are no findings suspicious for malignancy. IMPRESSION: No mammographic evidence of malignancy. A result letter of this screening mammogram will be mailed directly to the patient. RECOMMENDATION: Screening mammogram in one year. (Code:SM-B-01Y) BI-RADS CATEGORY  1: Negative. Electronically Signed   By: Frederico Hamman M.D.   On: 06/09/2023 10:49       Assessment & Plan:  Hypercholesteremia Assessment & Plan: Low cholesterol diet and exercise.  Follow lipid panel. Taking crestor.   Lab Results  Component Value Date   CHOL 203 (H) 09/24/2023   HDL 49 09/24/2023   LDLCALC 133 (H) 09/24/2023   TRIG 119 09/24/2023   CHOLHDL 4.1 09/24/2023     Orders: -     Hepatic function panel -     Lipid panel -     Basic metabolic panel  Pulmonary nodules Assessment & Plan: Pulmonary evaluation 05/2023 - no further w/up warranted.    Need for shingles vaccine  Stress Assessment & Plan: Overall appears to be doing relatively well. Follow.    History of colonic polyps Assessment & Plan: Colonoscopy 10/2019.  Recommended f/u colonoscopy in 5 years.    Gastroesophageal reflux disease, unspecified whether esophagitis present Assessment & Plan: Is s/p EGD 10/2019.  On protonix.  Followed by GI.    Abnormal liver function tests Assessment & Plan: Diagnosed with NAFLD.  Has been evaluated by GI.  Continue diet and exercise.  Follow liver function tests.  Followed by GI. Check liver panel today. Continue diet and exercise.    Other orders -     Rosuvastatin Calcium; Take 1 tablet (10  mg total) by mouth daily.  Dispense: 90 tablet; Refill: 3     Dale C[place]kside, MD

## 2023-10-12 ENCOUNTER — Encounter: Payer: Self-pay | Admitting: Internal Medicine

## 2023-10-12 NOTE — Assessment & Plan Note (Signed)
 Diagnosed with NAFLD.  Has been evaluated by GI.  Continue diet and exercise.  Follow liver function tests.  Followed by GI. Check liver panel today. Continue diet and exercise.

## 2023-10-12 NOTE — Assessment & Plan Note (Signed)
Overall appears to be doing relatively well.  Follow.   

## 2023-10-12 NOTE — Assessment & Plan Note (Signed)
Is s/p EGD 10/2019.  On protonix.  Followed by GI.

## 2023-10-12 NOTE — Assessment & Plan Note (Signed)
Colonoscopy 10/2019.  Recommended f/u colonoscopy in 5 years.  

## 2023-12-02 ENCOUNTER — Other Ambulatory Visit: Payer: Self-pay | Admitting: Gastroenterology

## 2023-12-02 DIAGNOSIS — K760 Fatty (change of) liver, not elsewhere classified: Secondary | ICD-10-CM | POA: Diagnosis not present

## 2023-12-04 ENCOUNTER — Encounter: Payer: Self-pay | Admitting: Internal Medicine

## 2023-12-04 DIAGNOSIS — M533 Sacrococcygeal disorders, not elsewhere classified: Secondary | ICD-10-CM | POA: Diagnosis not present

## 2023-12-04 DIAGNOSIS — M5459 Other low back pain: Secondary | ICD-10-CM | POA: Diagnosis not present

## 2023-12-04 DIAGNOSIS — M545 Low back pain, unspecified: Secondary | ICD-10-CM | POA: Insufficient documentation

## 2023-12-11 ENCOUNTER — Ambulatory Visit
Admission: RE | Admit: 2023-12-11 | Discharge: 2023-12-11 | Disposition: A | Discharge status: Home / Self Care | Source: Ambulatory Visit | Attending: Gastroenterology | Admitting: Gastroenterology

## 2023-12-11 DIAGNOSIS — K760 Fatty (change of) liver, not elsewhere classified: Secondary | ICD-10-CM

## 2023-12-11 DIAGNOSIS — K769 Liver disease, unspecified: Secondary | ICD-10-CM | POA: Diagnosis not present

## 2024-01-09 DIAGNOSIS — M461 Sacroiliitis, not elsewhere classified: Secondary | ICD-10-CM | POA: Diagnosis not present

## 2024-02-05 DIAGNOSIS — E7800 Pure hypercholesterolemia, unspecified: Secondary | ICD-10-CM | POA: Diagnosis not present

## 2024-02-06 ENCOUNTER — Ambulatory Visit: Payer: Self-pay | Admitting: Internal Medicine

## 2024-02-06 LAB — HEPATIC FUNCTION PANEL
ALT: 32 IU/L (ref 0–32)
AST: 27 IU/L (ref 0–40)
Albumin: 4.6 g/dL (ref 3.8–4.9)
Alkaline Phosphatase: 138 IU/L — ABNORMAL HIGH (ref 44–121)
Bilirubin Total: 0.7 mg/dL (ref 0.0–1.2)
Bilirubin, Direct: 0.22 mg/dL (ref 0.00–0.40)
Total Protein: 7.2 g/dL (ref 6.0–8.5)

## 2024-02-06 LAB — BASIC METABOLIC PANEL WITH GFR
BUN/Creatinine Ratio: 26 — ABNORMAL HIGH (ref 9–23)
BUN: 20 mg/dL (ref 6–24)
CO2: 21 mmol/L (ref 20–29)
Calcium: 9.7 mg/dL (ref 8.7–10.2)
Chloride: 102 mmol/L (ref 96–106)
Creatinine, Ser: 0.77 mg/dL (ref 0.57–1.00)
Glucose: 97 mg/dL (ref 70–99)
Potassium: 4.7 mmol/L (ref 3.5–5.2)
Sodium: 140 mmol/L (ref 134–144)
eGFR: 90 mL/min/1.73 (ref >59)

## 2024-02-06 LAB — LIPID PANEL
Chol/HDL Ratio: 3.3 ratio (ref 0.0–4.4)
Cholesterol, Total: 213 mg/dL — ABNORMAL HIGH (ref 100–199)
HDL: 64 mg/dL (ref >39)
LDL Chol Calc (NIH): 132 mg/dL — ABNORMAL HIGH (ref 0–99)
Triglycerides: 97 mg/dL (ref 0–149)
VLDL Cholesterol Cal: 17 mg/dL (ref 5–40)

## 2024-02-09 ENCOUNTER — Encounter: Payer: Self-pay | Admitting: Internal Medicine

## 2024-02-09 ENCOUNTER — Ambulatory Visit: Admitting: Internal Medicine

## 2024-02-09 VITALS — BP 122/70 | HR 81 | Temp 98.2°F | Resp 16 | Ht 66.0 in | Wt 235.0 lb

## 2024-02-09 DIAGNOSIS — L989 Disorder of the skin and subcutaneous tissue, unspecified: Secondary | ICD-10-CM

## 2024-02-09 DIAGNOSIS — E7800 Pure hypercholesterolemia, unspecified: Secondary | ICD-10-CM

## 2024-02-09 DIAGNOSIS — Z83719 Family history of colon polyps, unspecified: Secondary | ICD-10-CM | POA: Diagnosis not present

## 2024-02-09 DIAGNOSIS — Z862 Personal history of diseases of the blood and blood-forming organs and certain disorders involving the immune mechanism: Secondary | ICD-10-CM | POA: Diagnosis not present

## 2024-02-09 DIAGNOSIS — N951 Menopausal and female climacteric states: Secondary | ICD-10-CM

## 2024-02-09 DIAGNOSIS — R7989 Other specified abnormal findings of blood chemistry: Secondary | ICD-10-CM

## 2024-02-09 DIAGNOSIS — K219 Gastro-esophageal reflux disease without esophagitis: Secondary | ICD-10-CM

## 2024-02-09 DIAGNOSIS — F439 Reaction to severe stress, unspecified: Secondary | ICD-10-CM

## 2024-02-09 MED ORDER — CITALOPRAM HYDROBROMIDE 10 MG PO TABS: 10.0000 mg | ORAL_TABLET | Freq: Every day | ORAL | 2 refills | Status: DC

## 2024-02-09 MED ORDER — PANTOPRAZOLE SODIUM 40 MG PO TBEC: 40.0000 mg | DELAYED_RELEASE_TABLET | Freq: Two times a day (BID) | ORAL | 3 refills | Status: DC

## 2024-02-09 MED ORDER — MUPIROCIN 2 % EX OINT
1.0000 | TOPICAL_OINTMENT | Freq: Two times a day (BID) | CUTANEOUS | 0 refills | Status: DC
Start: 2024-02-09 — End: 2024-04-30

## 2024-02-09 NOTE — Progress Notes (Signed)
 Subjective:    Patient ID: Katie [name], female    DOB: 1965-09-15, 58 y.o.   MRN: [id number]  Patient here for  Chief Complaint  Patient presents with   Medical Management of Chronic Issues    HPI Here for a scheduled follow up - follow up regarding hypercholesterolemia, abnormal liver function tests, GERD and increased stress. Recently evaluated by pulmonary 06/10/23 - f/u pulmonary nodules. Felt no further w/up warranted. Has been evaluated by GI for fatty liver and f/u IBS and GERD. Upper symptoms controlled on protonix . Continue benefiber. Last seen 12/01/23. Discussed weight loss. Bowels better wording from home. F/u ultrasound 12/11/23 - changes c/w fatty liver - no other acute abnormality. Seeing [facility] for low back pain - SI joint pain. S/p steroid dose pak and MR. S/p SI joint injection 01/09/24. Still with persistent pain. Plans to f/u with ortho. She is doing stretches and walking. Breathing. No cough or congestion. No abdominal pain. Bowels overall better. Concern regarding inflamed hair follicle - left axilla. Also, increased stress, her youngest brother passed unexpectedly in 12/2023. Has good support. Increased stress. Also, mood swings with menopause and increased hot flashes. Not sleeping well.    Past Medical History:  Diagnosis Date   Anemia    Endometriosis    Erosive gastritis    GERD (gastroesophageal reflux disease)    History of kidney stones    Hyperplastic colon polyp    Irritable bowel syndrome    Kidney stones    Migraines    Nonalcoholic fatty liver disease    Ovarian cyst    Positional vertigo    PT helped   Past Surgical History:  Procedure Laterality Date   ABDOMINAL HYSTERECTOMY  2000   left oophorectomy, Dr Roni   APPENDECTOMY  2000   CESAREAN SECTION     COLONOSCOPY     ESOPHAGOGASTRODUODENOSCOPY     EUS N/A 12/04/2017   Procedure: ESOPHAGEAL ENDOSCOPIC ULTRASOUND (EUS) RADIAL;  Surgeon: Queenie Asberry LABOR, MD;  Location: [facility]  ENDOSCOPY;  Service: Gastroenterology;  Laterality: N/A;   EXPLORATORY LAPAROTOMY     IMAGE GUIDED SINUS SURGERY Bilateral 08/29/2016   Procedure: IMAGE GUIDED SINUS SURGERY;  Surgeon: Deward Argue, MD;  Location: [facility];  Service: ENT;  Laterality: Bilateral;  NEED DISK GAVE DISK TO CECE 1-23 KP   LITHOTRIPSY     RIGHT OOPHORECTOMY  2002   SEPTOPLASTY Bilateral 08/29/2016   Procedure: SEPTOPLASTY;  Surgeon: Deward Argue, MD;  Location: [facility];  Service: ENT;  Laterality: Bilateral;   SPHENOIDECTOMY Right 08/29/2016   Procedure: SPHENOIDECTOMY  with removal content right;  Surgeon: Deward Argue, MD;  Location: [facility] SURGERY [facility];  Service: ENT;  Laterality: Right;   VAGINAL BIRTH AFTER CESAREAN SECTION     Family History  Problem Relation Age of Onset   Hypertension Mother    Diabetes Mother    Thyroid  cancer Mother    Colon polyps Mother    Thyroid  cancer Maternal Aunt    Colon polyps Maternal Aunt    Kidney cancer Maternal Uncle    Breast cancer Cousin        maternal   Social History   Socioeconomic History   Marital status: Married    Spouse name: Not on file   Number of children: 2   Years of education: Not on file   Highest education level: Some college, no degree  Occupational History   Not on file  Tobacco Use   Smoking status: Never  Smokeless tobacco: Never  Vaping Use   Vaping status: Never Used  Substance and Sexual Activity   Alcohol use: No    Alcohol/week: 0.0 standard drinks of alcohol   Drug use: No   Sexual activity: Not on file    Comment: Married   Other Topics Concern   Not on file  Social History Narrative   Not on file   Social Drivers of Health   Financial Resource Strain: Low Risk  (02/05/2024)   Overall Financial Resource Strain (CARDIA)    Difficulty of Paying Living Expenses: Not hard at all  Food Insecurity: No Food Insecurity (02/05/2024)   Hunger Vital Sign    Worried About Running Out of Food in the Last  Year: Never true    Ran Out of Food in the Last Year: Never true  Transportation Needs: No Transportation Needs (02/05/2024)   PRAPARE - Administrator, Civil Service (Medical): No    Lack of Transportation (Non-Medical): No  Physical Activity: Insufficiently Active (02/05/2024)   Exercise Vital Sign    Days of Exercise per Week: 1 day    Minutes of Exercise per Session: 10 min  Stress: No Stress Concern Present (02/05/2024)   [employer] of Occupational Health - Occupational Stress Questionnaire    Feeling of Stress: Only a little  Social Connections: Socially Integrated (02/05/2024)   Social Connection and Isolation Panel    Frequency of Communication with Friends and Family: Twice a week    Frequency of Social Gatherings with Friends and Family: Once a week    Attends Religious Services: More than 4 times per year    Active Member of [employer] or Organizations: Yes    Attends Engineer, structural: More than 4 times per year    Marital Status: Married     Review of Systems     Objective:     BP 122/70   Pulse 81   Temp 98.2 F (36.8 C)   Resp 16   Ht 5' 6 (1.676 m)   Wt 235 lb (106.6 kg)   LMP 08/27/1998   SpO2 98%   BMI 37.93 kg/m  Wt Readings from Last 3 Encounters:  02/09/24 235 lb (106.6 kg)  10/07/23 242 lb 12.8 oz (110.1 kg)  06/10/23 243 lb (110.2 kg)    Physical Exam      Outpatient Encounter Medications as of 02/09/2024  Medication Sig   citalopram  (CELEXA ) 10 MG tablet Take 1 tablet (10 mg total) by mouth daily.   mupirocin  ointment (BACTROBAN ) 2 % Apply 1 Application topically 2 (two) times daily.   pantoprazole  (PROTONIX ) 40 MG tablet Take 1 tablet (40 mg total) by mouth 2 (two) times daily before a meal.   rosuvastatin  (CRESTOR ) 10 MG tablet Take 1 tablet (10 mg total) by mouth daily.   [DISCONTINUED] pantoprazole  (PROTONIX ) 40 MG tablet Take 1 tablet (40 mg total) by mouth 2 (two) times daily before a meal.   No  facility-administered encounter medications on file as of 02/09/2024.     Lab Results  Component Value Date   WBC 6.2 04/25/2023   HGB 13.1 04/25/2023   HCT 41.0 04/25/2023   PLT 191 04/25/2023   GLUCOSE 97 02/05/2024   CHOL 213 (H) 02/05/2024   TRIG 97 02/05/2024   HDL 64 02/05/2024   LDLCALC 132 (H) 02/05/2024   ALT 32 02/05/2024   AST 27 02/05/2024   NA 140 02/05/2024   K 4.7 02/05/2024   CL  102 02/05/2024   CREATININE 0.77 02/05/2024   BUN 20 02/05/2024   CO2 21 02/05/2024   TSH 1.730 04/25/2023   HGBA1C 4.9 09/29/2018    US  Abdomen Complete Result Date: 12/11/2023 CLINICAL DATA:  Nonalcoholic fatty liver disease. EXAM: ABDOMEN ULTRASOUND COMPLETE COMPARISON:  March 28, 2022. FINDINGS: Gallbladder: No gallstones or wall thickening visualized. No sonographic Murphy sign noted by sonographer. Common bile duct: Diameter: 4 mm which is within normal limits. Liver: No focal lesion identified. Increased echogenicity of hepatic parenchyma is noted suggesting hepatic steatosis. Portal vein is patent on color Doppler imaging with normal direction of blood flow towards the liver. IVC: No abnormality visualized. Pancreas: Visualized portion unremarkable. Spleen: Size and appearance within normal limits. Right Kidney: Length: 11.1 cm. Echogenicity within normal limits. No mass or hydronephrosis visualized. Left Kidney: Length: 12.1 cm. Echogenicity within normal limits. No mass or hydronephrosis visualized. Abdominal aorta: No aneurysm visualized. Other findings: None. IMPRESSION: Increased echogenicity of hepatic parenchyma suggesting hepatic steatosis. Electronically Signed   By: Lynwood Landy Raddle M.D.   On: 12/11/2023 08:53       Assessment & Plan:  Hypercholesteremia Assessment & Plan: Low cholesterol diet and exercise.  Follow lipid panel. Taking crestor .   Lab Results  Component Value Date   CHOL 213 (H) 02/05/2024   HDL 64 02/05/2024   LDLCALC 132 (H) 02/05/2024   TRIG 97  02/05/2024   CHOLHDL 3.3 02/05/2024     Orders: -     Basic metabolic panel with GFR -     Lipid panel -     Hepatic function panel -     TSH  History of anemia -     CBC with Differential/Platelet  Other orders -     Pantoprazole  Sodium; Take 1 tablet (40 mg total) by mouth 2 (two) times daily before a meal.  Dispense: 180 tablet; Refill: 3 -     Mupirocin ; Apply 1 Application topically 2 (two) times daily.  Dispense: 22 g; Refill: 0 -     Citalopram  Hydrobromide; Take 1 tablet (10 mg total) by mouth daily.  Dispense: 30 tablet; Refill: 2     Allena Hamilton, MD

## 2024-02-09 NOTE — Assessment & Plan Note (Signed)
 Low cholesterol diet and exercise.  Follow lipid panel. Taking crestor .  No changes in medication today.  Lab Results  Component Value Date   CHOL 213 (H) 02/05/2024   HDL 64 02/05/2024   LDLCALC 132 (H) 02/05/2024   TRIG 97 02/05/2024   CHOLHDL 3.3 02/05/2024

## 2024-02-10 ENCOUNTER — Encounter: Payer: Self-pay | Admitting: Internal Medicine

## 2024-02-10 DIAGNOSIS — L989 Disorder of the skin and subcutaneous tissue, unspecified: Secondary | ICD-10-CM | POA: Insufficient documentation

## 2024-02-10 NOTE — Assessment & Plan Note (Signed)
 Is s/p EGD 10/2019.  On protonix .  Followed by GI. No iupper symptoms reported.

## 2024-02-10 NOTE — Assessment & Plan Note (Signed)
 Question of inflamed hair follicle. Discussed warm compresses. Bactroban  as directed. Follow.

## 2024-02-10 NOTE — Assessment & Plan Note (Signed)
 Diagnosed with NAFLD.  Has been evaluated by GI.  Continue diet and exercise.  Follow liver function tests.  Discussed today. Alkaline phos - slightly elevated. Remainder of liver panel wnl. She has lost weight. Discussed weight loss. Discussed diet and exercise as outlined.

## 2024-02-10 NOTE — Assessment & Plan Note (Signed)
 Hot flashes as outlined.  Have discussed treatment options.  Desires to hold on starting estrogen therapy.  Previously tried effexor . Affecting her sleep. Does feel she needs something to help level things out. Start citalopram  10mg  q day.   Get her back in soon to reassess.

## 2024-02-10 NOTE — Assessment & Plan Note (Signed)
Colonoscopy 10/2019.  

## 2024-02-10 NOTE — Assessment & Plan Note (Signed)
 Increased stress as outlined. Does feel she needs something to help level things out. Will start ciralopram 10mg  q day. Follow.  Get her back in soon to reassess.

## 2024-02-22 ENCOUNTER — Encounter: Payer: Self-pay | Admitting: Internal Medicine

## 2024-02-24 NOTE — Telephone Encounter (Signed)
 Please call Katie [name] and confirm she is doing ok. Was started on citalopram  for hot flashes, etc. Please confirm nothing more needed at this time.

## 2024-02-24 NOTE — Telephone Encounter (Signed)
 Called pt and she stated that the medication did not really help with the hot flashes. She stated that since she stopped she has not had the anxiety that she was having when she took it.

## 2024-03-02 ENCOUNTER — Other Ambulatory Visit: Payer: Self-pay | Admitting: Internal Medicine

## 2024-03-09 DIAGNOSIS — M47816 Spondylosis without myelopathy or radiculopathy, lumbar region: Secondary | ICD-10-CM | POA: Diagnosis not present

## 2024-03-09 DIAGNOSIS — M5416 Radiculopathy, lumbar region: Secondary | ICD-10-CM | POA: Diagnosis not present

## 2024-03-16 DIAGNOSIS — M5416 Radiculopathy, lumbar region: Secondary | ICD-10-CM | POA: Diagnosis not present

## 2024-03-29 ENCOUNTER — Encounter: Payer: Self-pay | Admitting: Internal Medicine

## 2024-03-29 ENCOUNTER — Ambulatory Visit: Admitting: Internal Medicine

## 2024-03-29 ENCOUNTER — Other Ambulatory Visit: Payer: Self-pay | Admitting: Internal Medicine

## 2024-03-29 VITALS — BP 120/70 | HR 89 | Resp 16 | Ht 66.0 in | Wt 240.0 lb

## 2024-03-29 DIAGNOSIS — F439 Reaction to severe stress, unspecified: Secondary | ICD-10-CM

## 2024-03-29 DIAGNOSIS — E7800 Pure hypercholesterolemia, unspecified: Secondary | ICD-10-CM

## 2024-03-29 DIAGNOSIS — Z860100 Personal history of colon polyps, unspecified: Secondary | ICD-10-CM

## 2024-03-29 DIAGNOSIS — N951 Menopausal and female climacteric states: Secondary | ICD-10-CM

## 2024-03-29 DIAGNOSIS — Z1231 Encounter for screening mammogram for malignant neoplasm of breast: Secondary | ICD-10-CM

## 2024-03-29 DIAGNOSIS — M545 Low back pain, unspecified: Secondary | ICD-10-CM

## 2024-03-29 DIAGNOSIS — R7989 Other specified abnormal findings of blood chemistry: Secondary | ICD-10-CM

## 2024-03-29 DIAGNOSIS — K219 Gastro-esophageal reflux disease without esophagitis: Secondary | ICD-10-CM | POA: Diagnosis not present

## 2024-03-29 DIAGNOSIS — Z23 Encounter for immunization: Secondary | ICD-10-CM

## 2024-03-29 DIAGNOSIS — Z862 Personal history of diseases of the blood and blood-forming organs and certain disorders involving the immune mechanism: Secondary | ICD-10-CM

## 2024-03-29 NOTE — Progress Notes (Unsigned)
 Subjective:    Patient ID: Katie [name], female    DOB: [birth year], 58 y.o.   MRN: [id number]  Patient here for  Chief Complaint  Patient presents with   Medical Management of Chronic Issues    HPI Here for a scheduled follow up -  follow up regarding hypercholesterolemia, abnormal liver function tests, GERD and increased stress. Recently evaluated by pulmonary 06/10/23 - f/u pulmonary nodules. Felt no further w/up warranted. Has been evaluated by GI for fatty liver and f/u IBS and GERD. Upper symptoms controlled on protonix . Continue benefiber. Seeing [facility] for low back pain - SI joint pain. S/p steroid dose pak and MR. Had f/u 8/19 - recommended MRI L/S spine. S/p SI joint injection 01/09/24. Last visit, increased stress and increased hot flashes.  Started on citalopram . Did not tolerate. Made her more anxious. Anxiety is some better. Stress is some better. She is moving, but states she is handling this relatively well. No rush in moving. Hot flashes are some better. She does report her right ear had been bothering her - some pain. Started sudafed last week and nasacort  a couple of days ago. Better now. Breathing stable. Discussed weight loss. Discussed diet and exercise. Discussed weight loss medication.    Past Medical History:  Diagnosis Date   Anemia    Endometriosis    Erosive gastritis    GERD (gastroesophageal reflux disease)    History of kidney stones    Hyperplastic colon polyp    Irritable bowel syndrome    Kidney stones    Migraines    Nonalcoholic fatty liver disease    Ovarian cyst    Positional vertigo    PT helped   Past Surgical History:  Procedure Laterality Date   ABDOMINAL HYSTERECTOMY  2000   left oophorectomy, Dr Roni   APPENDECTOMY  2000   CESAREAN SECTION     COLONOSCOPY     ESOPHAGOGASTRODUODENOSCOPY     EUS N/A 12/04/2017   Procedure: ESOPHAGEAL ENDOSCOPIC ULTRASOUND (EUS) RADIAL;  Surgeon: Queenie Asberry LABOR, MD;  Location[facility] ENDOSCOPY;   Service: Gastroenterology;  Laterality: N/A;   EXPLORATORY LAPAROTOMY     IMAGE GUIDED SINUS SURGERY Bilateral 08/29/2016   Procedure: IMAGE GUIDED SINUS SURGERY;  Surgeon: Deward Argue, MD;  Location: [facility];  Service: ENT;  Laterality: Bilateral;  NEED DISK GAVE DISK TO CECE 1-23 KP   LITHOTRIPSY     RIGHT OOPHORECTOMY  2002   SEPTOPLASTY Bilateral 08/29/2016   Procedure: SEPTOPLASTY;  Surgeon: Deward Argue, MD;  Location: [facility];  Service: ENT;  Laterality: Bilateral;   SPHENOIDECTOMY Right 08/29/2016   Procedure: SPHENOIDECTOMY  with removal content right;  Surgeon: Deward Argue, MD;  Location: [facility];  Service: ENT;  Laterality: Right;   VAGINAL BIRTH AFTER CESAREAN SECTION     Family History  Problem Relation Age of Onset   Hypertension Mother    Diabetes Mother    Thyroid  cancer Mother    Colon polyps Mother    Thyroid  cancer Maternal Aunt    Colon polyps Maternal Aunt    Kidney cancer Maternal Uncle    Breast cancer Cousin        maternal   Social History   Socioeconomic History   Marital status: Married    Spouse name: Not on file   Number of children: 2   Years of education: Not on file   Highest education level: Some college, no degree  Occupational History   Not  on file  Tobacco Use   Smoking status: Never   Smokeless tobacco: Never  Vaping Use   Vaping status: Never Used  Substance and Sexual Activity   Alcohol use: No    Alcohol/week: 0.0 standard drinks of alcohol   Drug use: No   Sexual activity: Not on file    Comment: Married   Other Topics Concern   Not on file  Social History Narrative   Not on file   Social Drivers of Health   Financial Resource Strain: Low Risk  (02/05/2024)   Overall Financial Resource Strain (CARDIA)    Difficulty of Paying Living Expenses: Not hard at all  Food Insecurity: No Food Insecurity (02/05/2024)   Hunger Vital Sign    Worried About Running Out of Food in the Last Year: Never  true    Ran Out of Food in the Last Year: Never true  Transportation Needs: No Transportation Needs (02/05/2024)   PRAPARE - Administrator, Civil Service (Medical): No    Lack of Transportation (Non-Medical): No  Physical Activity: Insufficiently Active (02/05/2024)   Exercise Vital Sign    Days of Exercise per Week: 1 day    Minutes of Exercise per Session: 10 min  Stress: No Stress Concern Present (02/05/2024)   [employer] of Occupational Health - Occupational Stress Questionnaire    Feeling of Stress: Only a little  Social Connections: Socially Integrated (02/05/2024)   Social Connection and Isolation Panel    Frequency of Communication with Friends and Family: Twice a week    Frequency of Social Gatherings with Friends and Family: Once a week    Attends Religious Services: More than 4 times per year    Active Member of [employer] or Organizations: Yes    Attends Engineer, structural: More than 4 times per year    Marital Status: Married     Review of Systems  Constitutional:  Negative for appetite change and unexpected weight change.  HENT:  Negative for congestion and sinus pressure.   Respiratory:  Negative for cough, chest tightness and shortness of breath.   Cardiovascular:  Negative for chest pain, palpitations and leg swelling.  Gastrointestinal:  Negative for abdominal pain, diarrhea, nausea and vomiting.  Genitourinary:  Negative for difficulty urinating and dysuria.  Musculoskeletal:  Negative for joint swelling and myalgias.  Skin:  Negative for color change and rash.  Neurological:  Negative for dizziness and headaches.  Psychiatric/Behavioral:  Negative for agitation and dysphoric mood.        Objective:     BP 120/70   Pulse 89   Resp 16   Ht 5' 6 (1.676 m)   Wt 240 lb (108.9 kg)   LMP 08/27/1998   SpO2 98%   BMI 38.74 kg/m  Wt Readings from Last 3 Encounters:  03/29/24 240 lb (108.9 kg)  02/09/24 235 lb (106.6 kg)  10/07/23  242 lb 12.8 oz (110.1 kg)    Physical Exam Vitals reviewed.  Constitutional:      General: She is not in acute distress.    Appearance: Normal appearance.  HENT:     Head: Normocephalic and atraumatic.     Right Ear: Tympanic membrane, ear canal and external ear normal.     Left Ear: Tympanic membrane, ear canal and external ear normal.     Mouth/Throat:     Pharynx: No oropharyngeal exudate or posterior oropharyngeal erythema.  Eyes:     General: No scleral icterus.  Right eye: No discharge.        Left eye: No discharge.     Conjunctiva/sclera: Conjunctivae normal.  Neck:     Thyroid : No thyromegaly.  Cardiovascular:     Rate and Rhythm: Normal rate and regular rhythm.  Pulmonary:     Effort: No respiratory distress.     Breath sounds: Normal breath sounds. No wheezing.  Abdominal:     General: Bowel sounds are normal.     Palpations: Abdomen is soft.     Tenderness: There is no abdominal tenderness.  Musculoskeletal:        General: No swelling or tenderness.     Cervical back: Neck supple. No tenderness.  Lymphadenopathy:     Cervical: No cervical adenopathy.  Skin:    Findings: No erythema or rash.  Neurological:     Mental Status: She is alert.  Psychiatric:        Mood and Affect: Mood normal.        Behavior: Behavior normal.     {Perform Simple Foot Exam  Perform Detailed exam:1} {Insert foot Exam (Optional):30965}   Outpatient Encounter Medications as of 03/29/2024  Medication Sig   mupirocin  ointment (BACTROBAN ) 2 % Apply 1 Application topically 2 (two) times daily.   pantoprazole  (PROTONIX ) 40 MG tablet Take 1 tablet (40 mg total) by mouth 2 (two) times daily before a meal.   rosuvastatin  (CRESTOR ) 10 MG tablet Take 1 tablet (10 mg total) by mouth daily.   [DISCONTINUED] citalopram  (CELEXA ) 10 MG tablet Take 1 tablet (10 mg total) by mouth daily.   No facility-administered encounter medications on file as of 03/29/2024.     Lab Results   Component Value Date   WBC 6.2 04/25/2023   HGB 13.1 04/25/2023   HCT 41.0 04/25/2023   PLT 191 04/25/2023   GLUCOSE 97 02/05/2024   CHOL 213 (H) 02/05/2024   TRIG 97 02/05/2024   HDL 64 02/05/2024   LDLCALC 132 (H) 02/05/2024   ALT 32 02/05/2024   AST 27 02/05/2024   NA 140 02/05/2024   K 4.7 02/05/2024   CL 102 02/05/2024   CREATININE 0.77 02/05/2024   BUN 20 02/05/2024   CO2 21 02/05/2024   TSH 1.730 04/25/2023   HGBA1C 4.9 09/29/2018    US  Abdomen Complete Result Date: 12/11/2023 CLINICAL DATA:  Nonalcoholic fatty liver disease. EXAM: ABDOMEN ULTRASOUND COMPLETE COMPARISON:  March 28, 2022. FINDINGS: Gallbladder: No gallstones or wall thickening visualized. No sonographic Murphy sign noted by sonographer. Common bile duct: Diameter: 4 mm which is within normal limits. Liver: No focal lesion identified. Increased echogenicity of hepatic parenchyma is noted suggesting hepatic steatosis. Portal vein is patent on color Doppler imaging with normal direction of blood flow towards the liver. IVC: No abnormality visualized. Pancreas: Visualized portion unremarkable. Spleen: Size and appearance within normal limits. Right Kidney: Length: 11.1 cm. Echogenicity within normal limits. No mass or hydronephrosis visualized. Left Kidney: Length: 12.1 cm. Echogenicity within normal limits. No mass or hydronephrosis visualized. Abdominal aorta: No aneurysm visualized. Other findings: None. IMPRESSION: Increased echogenicity of hepatic parenchyma suggesting hepatic steatosis. Electronically Signed   By: Lynwood Landy Raddle M.D.   On: 12/11/2023 08:53       Assessment & Plan:  There are no diagnoses linked to this encounter.   Allena Hamilton, MD

## 2024-04-04 ENCOUNTER — Encounter: Payer: Self-pay | Admitting: Internal Medicine

## 2024-04-04 NOTE — Assessment & Plan Note (Signed)
 Last visit, increased stress and increased hot flashes.  Started on citalopram . Did not tolerate. Hot flashes are some better. Follow. Hold on any further intervention.

## 2024-04-04 NOTE — Assessment & Plan Note (Signed)
 Diagnosed with NAFLD.  Has been evaluated by GI.  Continue diet and exercise.  Follow liver function tests.  Discussed today. Alkaline phos - slightly elevated. Remainder of liver panel wnl. Continue diet and exercise.

## 2024-04-04 NOTE — Assessment & Plan Note (Signed)
 Is s/p EGD 10/2019.  On protonix .  Followed by GI. No upper symptoms reported.

## 2024-04-04 NOTE — Addendum Note (Signed)
 Addended by: GLENDIA ALLENA RAMAN on: 04/04/2024 03:14 PM   Modules accepted: Level of Service

## 2024-04-04 NOTE — Assessment & Plan Note (Signed)
 Follow cbc.

## 2024-04-04 NOTE — Assessment & Plan Note (Signed)
 Increased stress. Discussed. Moving. Overall she feels she is handling things relatively well. Follow.

## 2024-04-04 NOTE — Assessment & Plan Note (Signed)
Colonoscopy 10/2019.  Recommended f/u colonoscopy in 5 years.  

## 2024-04-04 NOTE — Assessment & Plan Note (Signed)
 Seeing [facility] for low back pain - SI joint pain. S/p steroid dose pak and MR. Had f/u 8/19 - recommended MRI L/S spine. S/p SI joint injection 01/09/24.

## 2024-04-04 NOTE — Assessment & Plan Note (Signed)
 Low cholesterol diet and exercise.  Follow lipid panel. Continue crestor .  Lab Results  Component Value Date   CHOL 213 (H) 02/05/2024   HDL 64 02/05/2024   LDLCALC 132 (H) 02/05/2024   TRIG 97 02/05/2024   CHOLHDL 3.3 02/05/2024

## 2024-04-07 DIAGNOSIS — M47816 Spondylosis without myelopathy or radiculopathy, lumbar region: Secondary | ICD-10-CM | POA: Diagnosis not present

## 2024-04-28 DIAGNOSIS — E7800 Pure hypercholesterolemia, unspecified: Secondary | ICD-10-CM | POA: Diagnosis not present

## 2024-04-28 DIAGNOSIS — Z862 Personal history of diseases of the blood and blood-forming organs and certain disorders involving the immune mechanism: Secondary | ICD-10-CM | POA: Diagnosis not present

## 2024-04-29 ENCOUNTER — Ambulatory Visit: Payer: Self-pay | Admitting: Internal Medicine

## 2024-04-29 LAB — CBC WITH DIFFERENTIAL/PLATELET
Basophils Absolute: 0 x10E3/uL (ref 0.0–0.2)
Basos: 1 %
EOS (ABSOLUTE): 0.1 x10E3/uL (ref 0.0–0.4)
Eos: 2 %
Hematocrit: 36.5 % (ref 34.0–46.6)
Hemoglobin: 11.6 g/dL (ref 11.1–15.9)
Immature Grans (Abs): 0 x10E3/uL (ref 0.0–0.1)
Immature Granulocytes: 0 %
Lymphocytes Absolute: 1.3 x10E3/uL (ref 0.7–3.1)
Lymphs: 20 %
MCH: 27.9 pg (ref 26.6–33.0)
MCHC: 31.8 g/dL (ref 31.5–35.7)
MCV: 88 fL (ref 79–97)
Monocytes Absolute: 0.4 x10E3/uL (ref 0.1–0.9)
Monocytes: 6 %
Neutrophils Absolute: 4.7 x10E3/uL (ref 1.4–7.0)
Neutrophils: 71 %
Platelets: 201 x10E3/uL (ref 150–450)
RBC: 4.16 x10E6/uL (ref 3.77–5.28)
RDW: 13.3 % (ref 11.7–15.4)
WBC: 6.5 x10E3/uL (ref 3.4–10.8)

## 2024-04-29 LAB — BASIC METABOLIC PANEL WITH GFR
BUN/Creatinine Ratio: 17 (ref 9–23)
BUN: 15 mg/dL (ref 6–24)
CO2: 22 mmol/L (ref 20–29)
Calcium: 8.5 mg/dL — ABNORMAL LOW (ref 8.7–10.2)
Chloride: 106 mmol/L (ref 96–106)
Creatinine, Ser: 0.89 mg/dL (ref 0.57–1.00)
Glucose: 96 mg/dL (ref 70–99)
Potassium: 4.3 mmol/L (ref 3.5–5.2)
Sodium: 140 mmol/L (ref 134–144)
eGFR: 76 mL/min/1.73 (ref >59)

## 2024-04-29 LAB — HEPATIC FUNCTION PANEL
ALT: 44 IU/L — ABNORMAL HIGH (ref 0–32)
AST: 46 IU/L — ABNORMAL HIGH (ref 0–40)
Albumin: 4.1 g/dL (ref 3.8–4.9)
Alkaline Phosphatase: 129 IU/L (ref 49–135)
Bilirubin Total: 0.4 mg/dL (ref 0.0–1.2)
Bilirubin, Direct: 0.14 mg/dL (ref 0.00–0.40)
Total Protein: 6.6 g/dL (ref 6.0–8.5)

## 2024-04-29 LAB — LIPID PANEL
Chol/HDL Ratio: 3.9 ratio (ref 0.0–4.4)
Cholesterol, Total: 181 mg/dL (ref 100–199)
HDL: 47 mg/dL (ref >39)
LDL Chol Calc (NIH): 114 mg/dL — ABNORMAL HIGH (ref 0–99)
Triglycerides: 110 mg/dL (ref 0–149)
VLDL Cholesterol Cal: 20 mg/dL (ref 5–40)

## 2024-04-29 LAB — TSH: TSH: 1.55 u[IU]/mL (ref 0.450–4.500)

## 2024-04-30 ENCOUNTER — Ambulatory Visit (INDEPENDENT_AMBULATORY_CARE_PROVIDER_SITE_OTHER): Admitting: Internal Medicine

## 2024-04-30 ENCOUNTER — Encounter: Payer: Self-pay | Admitting: Internal Medicine

## 2024-04-30 ENCOUNTER — Other Ambulatory Visit (HOSPITAL_COMMUNITY)
Admission: RE | Admit: 2024-04-30 | Discharge: 2024-04-30 | Disposition: A | Discharge status: Home / Self Care | Source: Ambulatory Visit | Attending: Internal Medicine | Admitting: Internal Medicine

## 2024-04-30 VITALS — BP 136/80 | HR 86 | Ht 66.0 in | Wt 238.4 lb

## 2024-04-30 DIAGNOSIS — R7989 Other specified abnormal findings of blood chemistry: Secondary | ICD-10-CM | POA: Diagnosis not present

## 2024-04-30 DIAGNOSIS — Z124 Encounter for screening for malignant neoplasm of cervix: Secondary | ICD-10-CM | POA: Insufficient documentation

## 2024-04-30 DIAGNOSIS — F439 Reaction to severe stress, unspecified: Secondary | ICD-10-CM

## 2024-04-30 DIAGNOSIS — R918 Other nonspecific abnormal finding of lung field: Secondary | ICD-10-CM

## 2024-04-30 DIAGNOSIS — E7800 Pure hypercholesterolemia, unspecified: Secondary | ICD-10-CM | POA: Diagnosis not present

## 2024-04-30 DIAGNOSIS — M545 Low back pain, unspecified: Secondary | ICD-10-CM

## 2024-04-30 DIAGNOSIS — N809 Endometriosis, unspecified: Secondary | ICD-10-CM

## 2024-04-30 DIAGNOSIS — Z0000 Encounter for general adult medical examination without abnormal findings: Secondary | ICD-10-CM | POA: Diagnosis not present

## 2024-04-30 DIAGNOSIS — K219 Gastro-esophageal reflux disease without esophagitis: Secondary | ICD-10-CM

## 2024-04-30 NOTE — Assessment & Plan Note (Addendum)
 Physical today 04/1024.  PAP 04/13/21- negative with negative HPV.  Repeat pap today. Mammogram 06/03/22 - Birads I. Scheduled for f/u mammogram 06/08/24. Colonoscopy 10/2019 - f/u in 5 years.

## 2024-04-30 NOTE — Assessment & Plan Note (Signed)
 Low cholesterol diet and exercise.  Follow lipid panel. Continue crestor .  Lab Results  Component Value Date   CHOL 181 04/28/2024   HDL 47 04/28/2024   LDLCALC 114 (H) 04/28/2024   TRIG 110 04/28/2024   CHOLHDL 3.9 04/28/2024

## 2024-04-30 NOTE — Progress Notes (Signed)
 Subjective:    Patient ID: Katie [name], female    DOB: 12/18/1965, 58 y.o.   MRN: [id number]  Patient here for  Chief Complaint  Patient presents with   Annual Exam    Physical with a pap smear    HPI Here for a physical. Recently evaluated by pulmonary 06/10/23 - f/u pulmonary nodules. Felt no further w/up warranted. Has been evaluated by GI for fatty liver and f/u IBS and GERD. Upper symptoms controlled on protonix . Continue benefiber. Take regularly. Seeing [facility] for low back pain - SI joint pain. S/p steroid dose pak and MR. Had f/u 8/19 - recommended MRI L/S spine. S/p SI joint injection 01/09/24. Evaluated by [facility] - 04/07/24 - chronic lower back pain - ESI scheduled for next week. Has a stand up desk now. Planning to start back - weight watchers. Breathing stable.    Past Medical History:  Diagnosis Date   Allergy 98987999   Seasonal   Anemia    Endometriosis    Erosive gastritis    GERD (gastroesophageal reflux disease)    History of kidney stones    Hyperplastic colon polyp    Irritable bowel syndrome    Kidney stones    Migraines    Nonalcoholic fatty liver disease    Ovarian cyst    Positional vertigo    PT helped   Past Surgical History:  Procedure Laterality Date   ABDOMINAL HYSTERECTOMY  2000   left oophorectomy, Dr Roni   APPENDECTOMY  2000   CESAREAN SECTION     COLONOSCOPY     ESOPHAGOGASTRODUODENOSCOPY     EUS N/A 12/04/2017   Procedure: ESOPHAGEAL ENDOSCOPIC ULTRASOUND (EUS) RADIAL;  Surgeon: Queenie Asberry LABOR, MD;  Location: [facility] ENDOSCOPY;  Service: Gastroenterology;  Laterality: N/A;   EXPLORATORY LAPAROTOMY     IMAGE GUIDED SINUS SURGERY Bilateral 08/29/2016   Procedure: IMAGE GUIDED SINUS SURGERY;  Surgeon: Deward Argue, MD;  Location: [facility];  Service: ENT;  Laterality: Bilateral;  NEED DISK GAVE DISK TO CECE 1-23 KP   LITHOTRIPSY     RIGHT OOPHORECTOMY  2002   SEPTOPLASTY Bilateral 08/29/2016   Procedure: SEPTOPLASTY;   Surgeon: Deward Argue, MD;  Location: [facility];  Service: ENT;  Laterality: Bilateral;   SPHENOIDECTOMY Right 08/29/2016   Procedure: SPHENOIDECTOMY  with removal content right;  Surgeon: Deward Argue, MD;  Location: [facility] SUR[facility]ERY [facility];  Service: ENT;  Laterality: Right;   VAGINAL BIRTH AFTER CESAREAN SECTION     Family History  Problem Relation Age of Onset   Hypertension Mother    Diabetes Mother    Thyroid  cancer Mother    Colon polyps Mother    Arthritis Mother    COPD Mother    Thyroid  cancer Maternal Aunt    COPD Maternal Aunt    Colon polyps Maternal Aunt    Stroke Maternal Aunt    Kidney cancer Maternal Uncle    Kidney disease Maternal Uncle    Breast cancer Cousin        maternal   Stroke Maternal Grandmother    Stroke Maternal Uncle    Early death Brother    Social History   Socioeconomic History   Marital status: Married    Spouse name: Not on file   Number of children: 2   Years of education: Not on file   Highest education level: Some college, no degree  Occupational History   Not on file  Tobacco Use   Smoking status: Never  Smokeless tobacco: Never  Vaping Use   Vaping status: Never Used  Substance and Sexual Activity   Alcohol use: No   Drug use: Never   Sexual activity: Yes    Birth control/protection: None    Comment: Married   Other Topics Concern   Not on file  Social History Narrative   Not on file   Social Drivers of Health   Financial Resource Strain: Low Risk  (02/05/2024)   Overall Financial Resource Strain (CARDIA)    Difficulty of Paying Living Expenses: Not hard at all  Food Insecurity: No Food Insecurity (02/05/2024)   Hunger Vital Sign    Worried About Running Out of Food in the Last Year: Never true    Ran Out of Food in the Last Year: Never true  Transportation Needs: No Transportation Needs (02/05/2024)   PRAPARE - Administrator, Civil Service (Medical): No    Lack of Transportation (Non-Medical): No   Physical Activity: Insufficiently Active (02/05/2024)   Exercise Vital Sign    Days of Exercise per Week: 1 day    Minutes of Exercise per Session: 10 min  Stress: No Stress Concern Present (02/05/2024)   [employer] of Occupational Health - Occupational Stress Questionnaire    Feeling of Stress: Only a little  Social Connections: Socially Integrated (02/05/2024)   Social Connection and Isolation Panel    Frequency of Communication with Friends and Family: Twice a week    Frequency of Social Gatherings with Friends and Family: Once a week    Attends Religious Services: More than 4 times per year    Active Member of [employer] or Organizations: Yes    Attends Engineer, structural: More than 4 times per year    Marital Status: Married     Review of Systems  Constitutional:  Negative for appetite change and unexpected weight change.  HENT:  Negative for congestion, sinus pressure and sore throat.   Eyes:  Negative for pain and visual disturbance.  Respiratory:  Negative for cough, chest tightness and shortness of breath.   Cardiovascular:  Negative for chest pain, palpitations and leg swelling.  Gastrointestinal:  Negative for abdominal pain, diarrhea, nausea and vomiting.  Genitourinary:  Negative for difficulty urinating and dysuria.  Musculoskeletal:  Positive for back pain. Negative for joint swelling and myalgias.  Skin:  Negative for color change and rash.  Neurological:  Negative for dizziness and headaches.  Hematological:  Negative for adenopathy. Does not bruise/bleed easily.  Psychiatric/Behavioral:  Negative for agitation and dysphoric mood.        Objective:     BP 136/80   Pulse 86   Ht 5' 6 (1.676 m)   Wt 238 lb 6.4 oz (108.1 kg)   LMP 08/27/1998   SpO2 96%   BMI 38.48 kg/m  Wt Readings from Last 3 Encounters:  04/30/24 238 lb 6.4 oz (108.1 kg)  03/29/24 240 lb (108.9 kg)  02/09/24 235 lb (106.6 kg)    Physical Exam Vitals reviewed.   Constitutional:      General: She is not in acute distress.    Appearance: Normal appearance. She is well-developed.  HENT:     Head: Normocephalic and atraumatic.     Right Ear: External ear normal.     Left Ear: External ear normal.     Mouth/Throat:     Pharynx: No oropharyngeal exudate or posterior oropharyngeal erythema.  Eyes:     General: No scleral icterus.  Right eye: No discharge.        Left eye: No discharge.     Conjunctiva/sclera: Conjunctivae normal.  Neck:     Thyroid : No thyromegaly.  Cardiovascular:     Rate and Rhythm: Normal rate and regular rhythm.  Pulmonary:     Effort: No tachypnea, accessory muscle usage or respiratory distress.     Breath sounds: Normal breath sounds. No decreased breath sounds or wheezing.  Chest:  Breasts:    Right: No inverted nipple, mass, nipple discharge or tenderness (no axillary adenopathy).     Left: No inverted nipple, mass, nipple discharge or tenderness (no axilarry adenopathy).  Abdominal:     General: Bowel sounds are normal.     Palpations: Abdomen is soft.     Tenderness: There is no abdominal tenderness.  Genitourinary:    Comments: Normal external genitalia.  Vaginal vault without lesions.  Pap smear performed.  Could not appreciate any adnexal masses or tenderness.   Musculoskeletal:        General: No swelling or tenderness.     Cervical back: Neck supple. No tenderness.  Lymphadenopathy:     Cervical: No cervical adenopathy.  Skin:    Findings: No erythema or rash.  Neurological:     Mental Status: She is alert and oriented to person, place, and time.  Psychiatric:        Mood and Affect: Mood normal.        Behavior: Behavior normal.         Outpatient Encounter Medications as of 04/30/2024  Medication Sig   pantoprazole  (PROTONIX ) 40 MG tablet Take 1 tablet (40 mg total) by mouth 2 (two) times daily before a meal.   rosuvastatin  (CRESTOR ) 10 MG tablet Take 1 tablet (10 mg total) by mouth  daily.   [DISCONTINUED] mupirocin  ointment (BACTROBAN ) 2 % Apply 1 Application topically 2 (two) times daily.   No facility-administered encounter medications on file as of 04/30/2024.     Lab Results  Component Value Date   WBC 6.5 04/28/2024   HGB 11.6 04/28/2024   HCT 36.5 04/28/2024   PLT 201 04/28/2024   GLUCOSE 96 04/28/2024   CHOL 181 04/28/2024   TRIG 110 04/28/2024   HDL 47 04/28/2024   LDLCALC 114 (H) 04/28/2024   ALT 44 (H) 04/28/2024   AST 46 (H) 04/28/2024   NA 140 04/28/2024   K 4.3 04/28/2024   CL 106 04/28/2024   CREATININE 0.89 04/28/2024   BUN 15 04/28/2024   CO2 22 04/28/2024   TSH 1.550 04/28/2024   HGBA1C 4.9 09/29/2018    US  Abdomen Complete Result Date: 12/11/2023 CLINICAL DATA:  Nonalcoholic fatty liver disease. EXAM: ABDOMEN ULTRASOUND COMPLETE COMPARISON:  March 28, 2022. FINDINGS: Gallbladder: No gallstones or wall thickening visualized. No sonographic Murphy sign noted by sonographer. Common bile duct: Diameter: 4 mm which is within normal limits. Liver: No focal lesion identified. Increased echogenicity of hepatic parenchyma is noted suggesting hepatic steatosis. Portal vein is patent on color Doppler imaging with normal direction of blood flow towards the liver. IVC: No abnormality visualized. Pancreas: Visualized portion unremarkable. Spleen: Size and appearance within normal limits. Right Kidney: Length: 11.1 cm. Echogenicity within normal limits. No mass or hydronephrosis visualized. Left Kidney: Length: 12.1 cm. Echogenicity within normal limits. No mass or hydronephrosis visualized. Abdominal aorta: No aneurysm visualized. Other findings: None. IMPRESSION: Increased echogenicity of hepatic parenchyma suggesting hepatic steatosis. Electronically Signed   By: Lynwood Landy Raddle M.D.   On:  12/11/2023 08:53       Assessment & Plan:  Routine general medical examination at a health care facility  Hypercholesteremia Assessment & Plan: Low cholesterol  diet and exercise.  Follow lipid panel. Continue crestor .  Lab Results  Component Value Date   CHOL 181 04/28/2024   HDL 47 04/28/2024   LDLCALC 114 (H) 04/28/2024   TRIG 110 04/28/2024   CHOLHDL 3.9 04/28/2024     Orders: -     Basic metabolic panel with GFR; Future -     Hepatic function panel; Future -     Lipid panel; Future  Health care maintenance Assessment & Plan: Physical today 04/1024.  PAP 04/13/21- negative with negative HPV.  Repeat pap today. Mammogram 06/03/22 - Birads I. Scheduled for f/u mammogram 06/08/24. Colonoscopy 10/2019 - f/u in 5 years.     Cervical cancer screening -     Cytology - PAP  Abnormal liver function tests Assessment & Plan: Diagnosed with NAFLD.  Has been evaluated by GI.  Continue diet and exercise.  Follow liver function tests.  Planning to start weight watchers.    Endometriosis Assessment & Plan: S/p hysterectomy.  Previous pap with ASCUS.  Saw Dr Neomi.  S/p colposcopy.  Recommended pap in 3 years.  PAP 04/16/21 - negative with negative HPV.  Repeat pap today.    Gastroesophageal reflux disease, unspecified whether esophagitis present Assessment & Plan: Is s/p EGD 10/2019.  On protonix .  Followed by GI. No upper GI symptoms reported.    Midline low back pain without sciatica, unspecified chronicity Assessment & Plan: Seeing [facility] for low back pain - SI joint pain. S/p steroid dose pak and MR. Had f/u 8/19 - recommended MRI L/S spine. S/p SI joint injection 01/09/24. Evaluated by [facility] - 04/07/24 - chronic lower back pain - ESI scheduled for next week. Has a stand up desk now.    Pulmonary nodules Assessment & Plan: Pulmonary evaluation 05/2023 - no further w/up warranted.    Stress Assessment & Plan: Overall appears to be handling things well. Follow.       Allena Hamilton, MD

## 2024-05-02 ENCOUNTER — Encounter: Payer: Self-pay | Admitting: Internal Medicine

## 2024-05-02 NOTE — Assessment & Plan Note (Signed)
 Diagnosed with NAFLD.  Has been evaluated by GI.  Continue diet and exercise.  Follow liver function tests.  Planning to start weight watchers.

## 2024-05-02 NOTE — Assessment & Plan Note (Signed)
 S/p hysterectomy.  Previous pap with ASCUS.  Saw Dr Neomi.  S/p colposcopy.  Recommended pap in 3 years.  PAP 04/16/21 - negative with negative HPV.  Repeat pap today.

## 2024-05-02 NOTE — Assessment & Plan Note (Signed)
 Pulmonary evaluation 05/2023 - no further w/up warranted.

## 2024-05-02 NOTE — Assessment & Plan Note (Signed)
 Is s/p EGD 10/2019.  On protonix .  Followed by GI. No upper GI symptoms reported.

## 2024-05-02 NOTE — Assessment & Plan Note (Signed)
 Overall appears to be handling things well.  Follow.  ?

## 2024-05-02 NOTE — Assessment & Plan Note (Signed)
 Seeing [facility] for low back pain - SI joint pain. S/p steroid dose pak and MR. Had f/u 8/19 - recommended MRI L/S spine. S/p SI joint injection 01/09/24. Evaluated by [facility] - 04/07/24 - chronic lower back pain - ESI scheduled for next week. Has a stand up desk now.

## 2024-05-05 LAB — CYTOLOGY - PAP
Adequacy: ABSENT
Comment: NEGATIVE
Diagnosis: NEGATIVE
Diagnosis: REACTIVE
High risk HPV: NEGATIVE

## 2024-05-06 ENCOUNTER — Ambulatory Visit: Payer: Self-pay | Admitting: Internal Medicine

## 2024-05-25 ENCOUNTER — Ambulatory Visit: Admitting: Family

## 2024-05-25 VITALS — BP 130/68 | HR 83 | Temp 98.0°F | Ht 67.0 in | Wt 230.8 lb

## 2024-05-25 DIAGNOSIS — R5383 Other fatigue: Secondary | ICD-10-CM

## 2024-05-25 DIAGNOSIS — K219 Gastro-esophageal reflux disease without esophagitis: Secondary | ICD-10-CM | POA: Diagnosis not present

## 2024-05-25 MED ORDER — OMEPRAZOLE 40 MG PO CPDR: 40.0000 mg | DELAYED_RELEASE_CAPSULE | Freq: Every day | ORAL | 3 refills | Status: DC

## 2024-05-25 NOTE — Progress Notes (Signed)
 Acute Office Visit  Subjective:     Patient ID: Katie [name], female    DOB: Aug 17, 1965, 58 y.o.   MRN: [id number]  Chief Complaint  Patient presents with   Abdominal Pain    HPI Patient is in today with complaints of increased belching, epigastric pain, feeling more fatigued over the past several days.  Reports having some lightheadedness and nausea on Saturday but denies any vomiting, blood in her stools or dark black stools.  Denies any spicy foods and tries to minimize her intake of fried foods.  Also reports right shoulder blade discomfort.  Denies any injuries.  Denies any increased stress in her life.  However, does note that her brother died in 02-20-24 and her mother died 3 years ago.  She is unsure about her father.  No family history of any cardiovascular disease.  She overall just does not feel like herself.   Review of Systems  Constitutional:  Positive for malaise/fatigue.  Cardiovascular: Negative.   Gastrointestinal:  Positive for heartburn and nausea. Negative for blood in stool, constipation, diarrhea and vomiting.       Epigastric pain  Musculoskeletal: Negative.        Right shoulder pain  Neurological: Negative.   Psychiatric/Behavioral: Negative.    All other systems reviewed and are negative.  Past Medical History:  Diagnosis Date   Allergy 98987999   Seasonal   Anemia    Endometriosis    Erosive gastritis    GERD (gastroesophageal reflux disease)    History of kidney stones    Hyperplastic colon polyp    Irritable bowel syndrome    Kidney stones    Migraines    Nonalcoholic fatty liver disease    Ovarian cyst    Positional vertigo    PT helped    Social History   Socioeconomic History   Marital status: Married    Spouse name: Not on file   Number of children: 2   Years of education: Not on file   Highest education level: Some college, no degree  Occupational History   Not on file  Tobacco Use   Smoking status: Never   Smokeless  tobacco: Never  Vaping Use   Vaping status: Never Used  Substance and Sexual Activity   Alcohol use: No   Drug use: Never   Sexual activity: Yes    Birth control/protection: None    Comment: Married   Other Topics Concern   Not on file  Social History Narrative   Not on file   Social Drivers of Health   Financial Resource Strain: Low Risk  (05/25/2024)   Overall Financial Resource Strain (CARDIA)    Difficulty of Paying Living Expenses: Not hard at all  Food Insecurity: No Food Insecurity (05/25/2024)   Hunger Vital Sign    Worried About Running Out of Food in the Last Year: Never true    Ran Out of Food in the Last Year: Never true  Transportation Needs: No Transportation Needs (05/25/2024)   PRAPARE - Administrator, Civil Service (Medical): No    Lack of Transportation (Non-Medical): No  Physical Activity: Insufficiently Active (05/25/2024)   Exercise Vital Sign    Days of Exercise per Week: 2 days    Minutes of Exercise per Session: 20 min  Stress: No Stress Concern Present (05/25/2024)   [employer] of Occupational Health - Occupational Stress Questionnaire    Feeling of Stress: Not at all  Social Connections: Socially Integrated (  05/25/2024)   Social Connection and Isolation Panel    Frequency of Communication with Friends and Family: Three times a week    Frequency of Social Gatherings with Friends and Family: Once a week    Attends Religious Services: More than 4 times per year    Active Member of Clubs or Organizations: Yes    Attends Engineer, Structural: More than 4 times per year    Marital Status: Married  Catering Manager Violence: Not on file    Past Surgical History:  Procedure Laterality Date   ABDOMINAL HYSTERECTOMY  2000   left oophorectomy, Dr Roni   APPENDECTOMY  2000   CESAREAN SECTION     COLONOSCOPY     ESOPHAGOGASTRODUODENOSCOPY     EUS N/A 12/04/2017   Procedure: ESOPHAGEAL ENDOSCOPIC ULTRASOUND (EUS) RADIAL;   Surgeon: Queenie Asberry LABOR, MD;  Location: [facility] ENDOSCOPY;  Service: Gastroenterology;  Laterality: N/A;   EXPLORATORY LAPAROTOMY     IMAGE GUIDED SINUS SURGERY Bilateral 08/29/2016   Procedure: IMAGE GUIDED SINUS SURGERY;  Surgeon: Deward Argue, MD;  Location: [facility];  Service: ENT;  Laterality: Bilateral;  NEED DISK GAVE DISK TO CECE 1-23 KP   LITHOTRIPSY     RIGHT OOPHORECTOMY  2002   SEPTOPLASTY Bilateral 08/29/2016   Procedure: SEPTOPLASTY;  Surgeon: Deward Argue, MD;  Location: [facility];  Service: ENT;  Laterality: Bilateral;   SPHENOIDECTOMY Right 08/29/2016   Procedure: SPHENOIDECTOMY  with removal content right;  Surgeon: Deward Argue, MD;  Location: [facility];  Service: ENT;  Laterality: Right;   VAGINAL BIRTH AFTER CESAREAN SECTION      Family History  Problem Relation Age of Onset   Hypertension Mother    Diabetes Mother    Thyroid  cancer Mother    Colon polyps Mother    Arthritis Mother    COPD Mother    Thyroid  cancer Maternal Aunt    COPD Maternal Aunt    Colon polyps Maternal Aunt    Stroke Maternal Aunt    Kidney cancer Maternal Uncle    Kidney disease Maternal Uncle    Breast cancer Cousin        maternal   Stroke Maternal Grandmother    Stroke Maternal Uncle    Early death Brother     Allergies  Allergen Reactions   Doxycycline  Diarrhea and Nausea And Vomiting        Levaquin  [Levofloxacin ]     Joint pain   Shellfish Allergy Nausea And Vomiting   Amoxicillin Rash   Codeine Other (See Comments)    Makes her feel disoriented   Contrast Media [Iodinated Contrast Media] Rash   Penicillins Rash    Has patient had a PCN reaction causing immediate rash, facial/tongue/throat swelling, SOB or lightheadedness with hypotension: Yes Has patient had a PCN reaction causing severe rash involving mucus membranes or skin necrosis: No Has patient had a PCN reaction that required hospitalization: No Has patient had a PCN reaction  occurring within the last 10 years: Yes If all of the above answers are NO, then may proceed with Cephalosporin use.  **No issues with CEFTIN **    Current Outpatient Medications on File Prior to Visit  Medication Sig Dispense Refill   rosuvastatin  (CRESTOR ) 10 MG tablet Take 1 tablet (10 mg total) by mouth daily. 90 tablet 3   No current facility-administered medications on file prior to visit.    BP 130/68   Pulse 83   Temp 98 F (36.7 C) (  Oral)   Ht 5' 7 (1.702 m)   Wt 230 lb 12.8 oz (104.7 kg)   LMP 08/27/1998   SpO2 97%   BMI 36.15 kg/m chart      Objective:    BP 130/68   Pulse 83   Temp 98 F (36.7 C) (Oral)   Ht 5' 7 (1.702 m)   Wt 230 lb 12.8 oz (104.7 kg)   LMP 08/27/1998   SpO2 97%   BMI 36.15 kg/m    Physical Exam Vitals and nursing note reviewed.  Constitutional:      Appearance: She is well-developed. She is obese.  Cardiovascular:     Rate and Rhythm: Normal rate and regular rhythm.  Pulmonary:     Effort: Pulmonary effort is normal.     Breath sounds: Normal breath sounds. No rhonchi.  Abdominal:     General: Bowel sounds are normal.     Palpations: Abdomen is soft. There is no hepatomegaly or splenomegaly.     Tenderness: There is abdominal tenderness in the epigastric area.  Skin:    General: Skin is warm and dry.  Neurological:     General: No focal deficit present.     Mental Status: She is alert and oriented to person, place, and time.  Psychiatric:        Mood and Affect: Mood normal.        Behavior: Behavior normal.     No results found for any visits on 05/25/24.      Assessment & Plan:   Problem List Items Addressed This Visit     GERD (gastroesophageal reflux disease) - Primary   Relevant Medications   omeprazole (PRILOSEC) 40 MG capsule   Other Relevant Orders   B12 and Folate Panel   CBC with Differential/Platelet   Comprehensive metabolic panel with GFR   TSH   Other Visit Diagnoses       Fatigue,  unspecified type           Meds ordered this encounter  Medications   omeprazole (PRILOSEC) 40 MG capsule    Sig: Take 1 capsule (40 mg total) by mouth daily.    Dispense:  30 capsule    Refill:  3   Discontinue Protonix .  Start omeprazole 40 mg once daily.  Labs obtained to include CMP, CBC, B12, and TSH.  Will notify patient pending results.  Call the office with any questions or concerns. No follow-ups on file.  Nakoa Ganus B Merian Wroe, FNP

## 2024-05-26 ENCOUNTER — Ambulatory Visit: Payer: Self-pay | Admitting: Family

## 2024-05-26 LAB — CBC WITH DIFFERENTIAL/PLATELET
Basophils Absolute: 0 x10E3/uL (ref 0.0–0.2)
Basos: 0 %
EOS (ABSOLUTE): 0.1 x10E3/uL (ref 0.0–0.4)
Eos: 1 %
Hematocrit: 41.1 % (ref 34.0–46.6)
Hemoglobin: 13.3 g/dL (ref 11.1–15.9)
Immature Grans (Abs): 0 x10E3/uL (ref 0.0–0.1)
Immature Granulocytes: 0 %
Lymphocytes Absolute: 1.2 x10E3/uL (ref 0.7–3.1)
Lymphs: 14 %
MCH: 28.2 pg (ref 26.6–33.0)
MCHC: 32.4 g/dL (ref 31.5–35.7)
MCV: 87 fL (ref 79–97)
Monocytes Absolute: 0.4 x10E3/uL (ref 0.1–0.9)
Monocytes: 5 %
Neutrophils Absolute: 6.8 x10E3/uL (ref 1.4–7.0)
Neutrophils: 80 %
Platelets: 205 x10E3/uL (ref 150–450)
RBC: 4.72 x10E6/uL (ref 3.77–5.28)
RDW: 14.1 % (ref 11.7–15.4)
WBC: 8.5 x10E3/uL (ref 3.4–10.8)

## 2024-05-26 LAB — COMPREHENSIVE METABOLIC PANEL WITH GFR
ALT: 31 IU/L (ref 0–32)
AST: 33 IU/L (ref 0–40)
Albumin: 4.3 g/dL (ref 3.8–4.9)
Alkaline Phosphatase: 117 IU/L (ref 49–135)
BUN/Creatinine Ratio: 27 — ABNORMAL HIGH (ref 9–23)
BUN: 19 mg/dL (ref 6–24)
Bilirubin Total: 0.6 mg/dL (ref 0.0–1.2)
CO2: 23 mmol/L (ref 20–29)
Calcium: 9.3 mg/dL (ref 8.7–10.2)
Chloride: 103 mmol/L (ref 96–106)
Creatinine, Ser: 0.71 mg/dL (ref 0.57–1.00)
Globulin, Total: 2.5 g/dL (ref 1.5–4.5)
Glucose: 88 mg/dL (ref 70–99)
Potassium: 4.5 mmol/L (ref 3.5–5.2)
Sodium: 141 mmol/L (ref 134–144)
Total Protein: 6.8 g/dL (ref 6.0–8.5)
eGFR: 100 mL/min/1.73 (ref >59)

## 2024-05-26 LAB — B12 AND FOLATE PANEL
Folate: 5.4 ng/mL (ref >3.0)
Vitamin B-12: 372 pg/mL (ref 232–1245)

## 2024-05-26 LAB — TSH: TSH: 1.37 u[IU]/mL (ref 0.450–4.500)

## 2024-05-31 DIAGNOSIS — M47816 Spondylosis without myelopathy or radiculopathy, lumbar region: Secondary | ICD-10-CM | POA: Diagnosis not present

## 2024-06-08 ENCOUNTER — Ambulatory Visit
Admission: RE | Admit: 2024-06-08 | Discharge: 2024-06-08 | Disposition: A | Discharge status: Home / Self Care | Source: Ambulatory Visit | Attending: Internal Medicine | Admitting: Internal Medicine

## 2024-06-08 DIAGNOSIS — K219 Gastro-esophageal reflux disease without esophagitis: Secondary | ICD-10-CM | POA: Diagnosis not present

## 2024-06-08 DIAGNOSIS — K582 Mixed irritable bowel syndrome: Secondary | ICD-10-CM | POA: Diagnosis not present

## 2024-06-08 DIAGNOSIS — Z1231 Encounter for screening mammogram for malignant neoplasm of breast: Secondary | ICD-10-CM | POA: Insufficient documentation

## 2024-06-08 DIAGNOSIS — K760 Fatty (change of) liver, not elsewhere classified: Secondary | ICD-10-CM | POA: Diagnosis not present

## 2024-08-20 ENCOUNTER — Other Ambulatory Visit: Payer: Self-pay | Admitting: Family

## 2024-08-24 ENCOUNTER — Other Ambulatory Visit: Payer: Self-pay

## 2024-08-24 MED ORDER — PANTOPRAZOLE SODIUM 40 MG PO TBEC: 40.0000 mg | DELAYED_RELEASE_TABLET | Freq: Two times a day (BID) | ORAL | 0 refills | Status: AC

## 2024-08-24 NOTE — Telephone Encounter (Signed)
 Spoke with pt and she stated that she is not taking the Omeprazole  any longer. She stated that she went back to taking the Protonix .

## 2024-09-01 ENCOUNTER — Other Ambulatory Visit

## 2024-09-03 ENCOUNTER — Ambulatory Visit: Admitting: Internal Medicine
